# Patient Record
Sex: Female | Born: 1960 | Race: Black or African American | Hispanic: No | Smoking: Current every day smoker
Health system: Southern US, Community
[De-identification: ages and names within clinical notes are randomized; demographics above are authoritative.]

## PROBLEM LIST (undated history)

## (undated) DIAGNOSIS — K219 Gastro-esophageal reflux disease without esophagitis: Secondary | ICD-10-CM

## (undated) DIAGNOSIS — E213 Hyperparathyroidism, unspecified: Secondary | ICD-10-CM

## (undated) DIAGNOSIS — I10 Essential (primary) hypertension: Secondary | ICD-10-CM

## (undated) DIAGNOSIS — E785 Hyperlipidemia, unspecified: Secondary | ICD-10-CM

## (undated) DIAGNOSIS — D649 Anemia, unspecified: Secondary | ICD-10-CM

## (undated) DIAGNOSIS — N189 Chronic kidney disease, unspecified: Secondary | ICD-10-CM

## (undated) HISTORY — DX: Hyperlipidemia, unspecified: E78.5

## (undated) HISTORY — DX: Chronic kidney disease, unspecified: N18.9

## (undated) HISTORY — PX: CERVICAL DISCECTOMY: SHX98

## (undated) HISTORY — PX: WISDOM TOOTH EXTRACTION: SHX21

## (undated) HISTORY — DX: Gastro-esophageal reflux disease without esophagitis: K21.9

## (undated) HISTORY — DX: Essential (primary) hypertension: I10

---

## 1997-06-15 ENCOUNTER — Other Ambulatory Visit: Admission: RE | Admit: 1997-06-15 | Discharge: 1997-06-15 | Payer: Self-pay | Admitting: *Deleted

## 1998-06-11 ENCOUNTER — Other Ambulatory Visit: Admission: RE | Admit: 1998-06-11 | Discharge: 1998-06-11 | Payer: Self-pay | Admitting: *Deleted

## 1998-06-20 ENCOUNTER — Encounter: Payer: Self-pay | Admitting: Cardiovascular Disease

## 1998-06-20 ENCOUNTER — Inpatient Hospital Stay (HOSPITAL_COMMUNITY): Admission: AD | Admit: 1998-06-20 | Discharge: 1998-06-22 | Payer: Self-pay | Admitting: Cardiovascular Disease

## 1999-06-03 ENCOUNTER — Other Ambulatory Visit: Admission: RE | Admit: 1999-06-03 | Discharge: 1999-06-03 | Payer: Self-pay | Admitting: *Deleted

## 2000-06-04 ENCOUNTER — Other Ambulatory Visit: Admission: RE | Admit: 2000-06-04 | Discharge: 2000-06-04 | Payer: Self-pay | Admitting: *Deleted

## 2000-06-16 ENCOUNTER — Encounter: Payer: Self-pay | Admitting: Neurosurgery

## 2000-06-19 ENCOUNTER — Encounter: Payer: Self-pay | Admitting: Neurosurgery

## 2000-06-19 ENCOUNTER — Ambulatory Visit (HOSPITAL_COMMUNITY): Admission: RE | Admit: 2000-06-19 | Discharge: 2000-06-20 | Payer: Self-pay | Admitting: Neurosurgery

## 2000-07-23 ENCOUNTER — Encounter: Payer: Self-pay | Admitting: Neurosurgery

## 2000-07-23 ENCOUNTER — Ambulatory Visit (HOSPITAL_COMMUNITY): Admission: RE | Admit: 2000-07-23 | Discharge: 2000-07-23 | Payer: Self-pay | Admitting: Neurosurgery

## 2000-09-24 ENCOUNTER — Encounter: Payer: Self-pay | Admitting: Neurosurgery

## 2000-09-24 ENCOUNTER — Ambulatory Visit (HOSPITAL_COMMUNITY): Admission: RE | Admit: 2000-09-24 | Discharge: 2000-09-24 | Payer: Self-pay | Admitting: Neurosurgery

## 2001-07-27 ENCOUNTER — Ambulatory Visit (HOSPITAL_COMMUNITY): Admission: RE | Admit: 2001-07-27 | Discharge: 2001-07-27 | Payer: Self-pay | Admitting: Neurosurgery

## 2001-07-27 ENCOUNTER — Encounter: Payer: Self-pay | Admitting: Neurosurgery

## 2001-09-16 ENCOUNTER — Ambulatory Visit (HOSPITAL_COMMUNITY): Admission: RE | Admit: 2001-09-16 | Discharge: 2001-09-16 | Payer: Self-pay | Admitting: Otolaryngology

## 2001-09-16 ENCOUNTER — Encounter: Payer: Self-pay | Admitting: Otolaryngology

## 2001-09-23 ENCOUNTER — Other Ambulatory Visit: Admission: RE | Admit: 2001-09-23 | Discharge: 2001-09-23 | Payer: Self-pay | Admitting: *Deleted

## 2001-12-23 ENCOUNTER — Observation Stay (HOSPITAL_COMMUNITY): Admission: EM | Admit: 2001-12-23 | Discharge: 2001-12-24 | Payer: Self-pay | Admitting: Emergency Medicine

## 2001-12-23 ENCOUNTER — Encounter: Payer: Self-pay | Admitting: Emergency Medicine

## 2001-12-24 ENCOUNTER — Encounter: Payer: Self-pay | Admitting: Family Medicine

## 2002-01-20 ENCOUNTER — Ambulatory Visit (HOSPITAL_COMMUNITY): Admission: RE | Admit: 2002-01-20 | Discharge: 2002-01-20 | Payer: Self-pay | Admitting: Family Medicine

## 2002-04-12 ENCOUNTER — Emergency Department (HOSPITAL_COMMUNITY): Admission: EM | Admit: 2002-04-12 | Discharge: 2002-04-12 | Payer: Self-pay | Admitting: Emergency Medicine

## 2002-04-25 ENCOUNTER — Ambulatory Visit (HOSPITAL_COMMUNITY): Admission: RE | Admit: 2002-04-25 | Discharge: 2002-04-25 | Payer: Self-pay | Admitting: Family Medicine

## 2002-04-25 ENCOUNTER — Encounter: Payer: Self-pay | Admitting: Family Medicine

## 2002-05-26 ENCOUNTER — Encounter (HOSPITAL_COMMUNITY): Admission: RE | Admit: 2002-05-26 | Discharge: 2002-06-25 | Payer: Self-pay | Admitting: Neurosurgery

## 2002-10-10 ENCOUNTER — Other Ambulatory Visit: Admission: RE | Admit: 2002-10-10 | Discharge: 2002-10-10 | Payer: Self-pay | Admitting: *Deleted

## 2002-12-19 HISTORY — PX: COLONOSCOPY WITH ESOPHAGOGASTRODUODENOSCOPY (EGD): SHX5779

## 2003-01-05 ENCOUNTER — Ambulatory Visit (HOSPITAL_COMMUNITY): Admission: RE | Admit: 2003-01-05 | Discharge: 2003-01-05 | Payer: Self-pay | Admitting: Internal Medicine

## 2003-03-23 ENCOUNTER — Ambulatory Visit (HOSPITAL_COMMUNITY): Admission: RE | Admit: 2003-03-23 | Discharge: 2003-03-23 | Payer: Self-pay | Admitting: *Deleted

## 2004-04-02 ENCOUNTER — Ambulatory Visit (HOSPITAL_COMMUNITY): Admission: RE | Admit: 2004-04-02 | Discharge: 2004-04-02 | Payer: Self-pay | Admitting: *Deleted

## 2005-04-15 ENCOUNTER — Ambulatory Visit (HOSPITAL_COMMUNITY): Admission: RE | Admit: 2005-04-15 | Discharge: 2005-04-15 | Payer: Self-pay | Admitting: *Deleted

## 2006-06-12 ENCOUNTER — Ambulatory Visit (HOSPITAL_COMMUNITY): Admission: RE | Admit: 2006-06-12 | Discharge: 2006-06-12 | Payer: Self-pay | Admitting: *Deleted

## 2006-11-05 ENCOUNTER — Ambulatory Visit (HOSPITAL_COMMUNITY): Admission: RE | Admit: 2006-11-05 | Discharge: 2006-11-05 | Payer: Self-pay | Admitting: Family Medicine

## 2007-07-13 ENCOUNTER — Ambulatory Visit (HOSPITAL_COMMUNITY): Admission: RE | Admit: 2007-07-13 | Discharge: 2007-07-13 | Payer: Self-pay | Admitting: *Deleted

## 2007-07-15 ENCOUNTER — Encounter (HOSPITAL_COMMUNITY): Admission: RE | Admit: 2007-07-15 | Discharge: 2007-08-14 | Payer: Self-pay | Admitting: Family Medicine

## 2008-08-09 ENCOUNTER — Ambulatory Visit (HOSPITAL_COMMUNITY): Admission: RE | Admit: 2008-08-09 | Discharge: 2008-08-09 | Payer: Self-pay | Admitting: Obstetrics and Gynecology

## 2009-06-01 ENCOUNTER — Inpatient Hospital Stay (HOSPITAL_COMMUNITY): Admission: EM | Admit: 2009-06-01 | Discharge: 2009-06-02 | Payer: Self-pay | Admitting: Emergency Medicine

## 2010-01-21 ENCOUNTER — Ambulatory Visit (HOSPITAL_COMMUNITY)
Admission: RE | Admit: 2010-01-21 | Discharge: 2010-01-21 | Payer: Self-pay | Source: Home / Self Care | Admitting: Obstetrics and Gynecology

## 2010-03-10 ENCOUNTER — Encounter: Payer: Self-pay | Admitting: *Deleted

## 2010-05-07 LAB — COMPREHENSIVE METABOLIC PANEL
BUN: 15 mg/dL (ref 6–23)
Calcium: 9.8 mg/dL (ref 8.4–10.5)
GFR calc Af Amer: >60 mL/min (ref >60)
Glucose, Bld: 113 mg/dL — ABNORMAL HIGH (ref 70–99)
Potassium: 3.9 mEq/L (ref 3.5–5.1)

## 2010-05-07 LAB — LIPID PANEL
HDL: 59 mg/dL (ref >39)
LDL Cholesterol: 142 mg/dL — ABNORMAL HIGH (ref 0–99)
Triglycerides: 78 mg/dL (ref <150)

## 2010-05-07 LAB — DIFFERENTIAL
Basophils Absolute: 0.1 10*3/uL (ref 0.0–0.1)
Basophils Relative: 1 % (ref 0–1)
Eosinophils Relative: 2 % (ref 0–5)
Lymphs Abs: 3.6 10*3/uL (ref 0.7–4.0)
Monocytes Absolute: 0.5 10*3/uL (ref 0.1–1.0)
Monocytes Relative: 6 % (ref 3–12)
Neutro Abs: 4.8 10*3/uL (ref 1.7–7.7)
Neutrophils Relative %: 52 % (ref 43–77)

## 2010-05-07 LAB — CBC
HCT: 38.8 % (ref 36.0–46.0)
MCHC: 35.5 g/dL (ref 30.0–36.0)
MCV: 87.2 fL (ref 78.0–100.0)
Platelets: 276 10*3/uL (ref 150–400)
RBC: 4.46 MIL/uL (ref 3.87–5.11)
RDW: 12.6 % (ref 11.5–15.5)

## 2010-05-07 LAB — URINALYSIS, ROUTINE W REFLEX MICROSCOPIC
Glucose, UA: NEGATIVE mg/dL
Leukocytes, UA: NEGATIVE
Nitrite: NEGATIVE
Nitrite: NEGATIVE
Urobilinogen, UA: 0.2 mg/dL (ref 0.0–1.0)
pH: 5.5 (ref 5.0–8.0)

## 2010-05-07 LAB — PREGNANCY, URINE: Preg Test, Ur: NEGATIVE

## 2010-05-07 LAB — POCT CARDIAC MARKERS
CKMB, poc: <1 ng/mL — ABNORMAL LOW (ref 1.0–8.0)
Myoglobin, poc: 54.6 ng/mL (ref 12–200)
Troponin i, poc: <0.05 ng/mL (ref 0.00–0.09)

## 2010-05-07 LAB — URINE CULTURE: Culture: NO GROWTH

## 2010-05-07 LAB — URINE MICROSCOPIC-ADD ON

## 2010-05-07 LAB — BASIC METABOLIC PANEL
CO2: 23 mEq/L (ref 19–32)
Chloride: 106 mEq/L (ref 96–112)
GFR calc Af Amer: >60 mL/min (ref >60)
GFR calc non Af Amer: 53 mL/min — ABNORMAL LOW (ref >60)
Glucose, Bld: 91 mg/dL (ref 70–99)
Sodium: 136 mEq/L (ref 135–145)

## 2010-05-07 LAB — HEMOGLOBIN A1C: Hgb A1c MFr Bld: 5.9 % — ABNORMAL HIGH (ref <5.7)

## 2010-07-05 NOTE — Op Note (Signed)
NAME:  Sandra Trujillo, Sandra Trujillo                          ACCOUNT NO.:  [id number]   MEDICAL RECORD NO.:  [id number]                   PATIENT TYPE:  AMB   LOCATION:  DAY                                  FACILITY:  [facility]   PHYSICIAN:  Lionel December, M.D.                 DATE OF BIRTH:  December 12, 1960   DATE OF PROCEDURE:  01/05/2003  DATE OF DISCHARGE:                                 OPERATIVE REPORT   PROCEDURE:  Esophagogastroduodenoscopy followed by total colonoscopy.   INDICATIONS FOR PROCEDURE:  Sandra Trujillo is a 50 year old African American female  who was recently found to have microcytic anemia subsequently confirmed to  be due to iron deficiency.  Serum ferritin was 3.  She does have chronic  GERD, and her symptoms have not been well-controlled but she is doing better  since she was switched over to Nexium recently.  She has not had any melena  or rectal bleeding.  It is possible that her iron deficiency anemia is due  to uterine blood losses.  She is undergoing GI workup to make sure she does  not have any condition which could be causing her iron deficiency anemia.  The procedure risks were reviewed with the patient, and informed consent was  obtained.   PREOPERATIVE MEDICATIONS:  Cetacaine spray for pharyngeal topical  anesthesia, Demerol 50 mg IV, Versed 6 mg IV in divided doses.   FINDINGS:  The procedure was performed in the endoscopy suite.  The  patient's vital signs and O2 saturations were monitored during the procedure  and remained stable.   PROCEDURE #1, ESOPHAGOGASTRODUODENOSCOPY:  The patient was placed in the  left lateral recumbent position, and the Olympus videoscope was passed via  the oropharynx without any difficulty into the esophagus.   Esophagus:  The mucosa of the esophagus was normal.  The squamocolumnar  junction was wavy and had erythema on the gastric side.  There was a small  sliding hiatal hernia.   Stomach:  It was empty and distended very well with  insufflation.  The folds  of the proximal stomach were normal.  Examination of  the mucosa was normal,  except for prepyloric focal erythema.  There were no erosions or ulcers  noted.  The pyloric channel was patent.  The angularis, fundus, and cardia  were examined by retroflexing the scope and were normal.   Duodenum:  Examination of the bulb and postbulbar duodenum was normal.  The  endoscope was withdrawn and the patient prepared for procedure #2.   PROCEDURE #2, TOTAL COLONOSCOPY:  Rectal was examination performed.  No  abnormality noted on external or digital exam.  The Olympus videoscope was  placed into the rectum and advanced into the region of the sigmoid colon and  beyond.  The preparation was excellent.  The scope was passed to the cecum  which was identified by the appendiceal orifice and ileocecal valve.  Pictures  were taken for the record.  A short segment of TI was also examined  and was normal.  As the scope was withdrawn, the colonic mucosa was  carefully examined and was normal.  The rectal mucosa similarly was normal.  The scope was retroflexed to examine the anorectal junction which was  unremarkable.  The endoscope was straightened and withdrawn.  The patient  tolerated the procedure well.   FINAL DIAGNOSES:  1. Small sliding hiatal hernia and mild change of reflux esophagitis limited     to gastroesophageal junction.  Otherwise normal     esophagogastroduodenoscopy.  2. Normal colonoscopy and terminal ileoscopy.  3. I suspect her iron deficiency anemia is secondary to uterine blood     losses.   RECOMMENDATIONS:  1. She will continue antireflux measures and Nexium as before.  2. She will resume ferrous sulfate 325 mg p.o. Truj[place].i.d. or equivalent     preparation.  3. She will have CBC in one month from now.      ___________________________________________                                            Lionel December, M.D.   NR/MEDQ  D:  01/05/2003  T:   01/06/2003  Job:  [id number]   cc:   Caro Hight, N.P.   Scott A. Gerda Diss, M.D.  [address]  [place]  [place] [address]  Fax: [phone]

## 2010-07-05 NOTE — Discharge Summary (Signed)
   NAMEDESTENEE, GUERRY NO.:  [id number]   MEDICAL RECORD NO.:  [id number]                  PATIENT TYPE:   LOCATION:                                       FACILITY:   PHYSICIAN:  Donna Bernard, M.D.             DATE OF BIRTH:   DATE OF ADMISSION:  DATE OF DISCHARGE:  12/24/2001                                 DISCHARGE SUMMARY   DIAGNOSES:  1. Chest pain, myocardial infarction ruled out.  2. Hypertension.   DISPOSITION:  1. The patient discharged home.  2. The patient to maintain usual medications.  3. The patient to follow up with Dr. Lilyan Punt next week.   INITIAL HISTORY AND PHYSICAL:  Please see H&P as dictated per Dr. Hoover Brunette.   HOSPITAL COURSE:  This patient is a 50 year old female with a history of  hypertension who presented to the hospital the day of admission with a  complaint of chest discomfort.  The patient described the chest pain as  sharp at times and retro-sternal.  The pain at times was quite discomforting  and intermittent.  She was seen in the emergency room and given  nitroglycerine GI cocktail which seemed to improve her symptoms.  The  patient was admitted to the hospital and she had serial cardiac enzymes  performed which were negative.  Telemetry revealed no evidence of any type  of abnormalities.  HOTs were stable.  Gallbladder ultrasound was performed  which was unremarkable.  Due to the potential gastritis component, the  patient was given Prilosec 20 mg daily.  The day after admission, the  patient was doing fine.  She had no pain.  With her history of a negative  cardiac workup 2 years before, the patient felt that she could safely head  home and continue her workup through her primary care physician.  The  patient was discharged home.  Diagnoses and disposition as noted above.                                               Donna Bernard, M.D.    WSL/MEDQ  D:  01/29/2002  T:  01/30/2002  Job:   [id number]

## 2010-07-05 NOTE — H&P (Signed)
Sandra Trujillo, Sandra Trujillo                            ACCOUNT NO.:  [id number]   MEDICAL RECORD NO.:  [id number]                  PATIENT TYPE:   LOCATION:                                       FACILITY:   PHYSICIAN:  Lionel December, M.D.                 DATE OF BIRTH:  02/14/1961   DATE OF ADMISSION:  DATE OF DISCHARGE:                                HISTORY & PHYSICAL   REQUESTING PHYSICIAN:  Scott A. Gerda Diss, M.D.   CHIEF COMPLAINT:  Intractable GERD and normocytic anemia.   HISTORY OF PRESENT ILLNESS:  This patient is a 50 year old African-American  female who presents to our office for evaluation of persistent dyspepsia and  reflux which she just describes as a persistent burning pain that radiates  through to her back.  She states that she has had this off and on for 5  years now. old African-American  female who presents to our office for evaluation of persistent dyspepsia and  reflux which she just describes as a persistent burning pain that radiates  through to her back.  She states that she has had this off and on for 5  years now. She has been on Protonix for approximately 11 months with good  relief of her symptoms; however, they have exacerbated recently.  She also  is complaining of some intermittent dysphagia as well as odynophagia. She  reports occasional nausea without any emesis.  She was evaluated by Caro Hight, NP and found to have normocytic iron-deficiency anemia with a  hemoglobin of 9.3, hematocrit of 29.6, MCV of 81.1 and a ferritin of 3 and  vitamin B12 of 343.  She denies any known history of anemia or peptic ulcer  disease.  She denies any melena or rectal bleeding.  She reports that her  bowel movements are soft, formed, and brown on a daily basis.  She was  recently started on iron approximately 3 weeks ago.  She denies any NSAID or  aspirin use. She denies any known history of colorectal carcinoma.   PAST MEDICAL HISTORY:  1. Hypertension.  2. Seasonal allergic rhinitis.   PAST SURGICAL HISTORY:  Cervical discectomy and normal cardiac  catheterization in 2000.   CURRENT MEDICATIONS:  1. Protonix 40 mg daily.  2. Neurontin unknown dose daily.  3. Ziac unknown dose daily.  4. Micronor daily.  5. Allegra  180 mg daily.  6. Nasonex daily.  7. Chromagen Forte 1 tablet daily.   ALLERGIES:  SULFA.   FAMILY HISTORY:  No known family history of colorectal carcinoma, liver, or  chronic GI problems.  Mother is age 55 with hypertension.  Father is age 41  with hypertension as well as asthma.  She has 2 sisters and 2 brothers all  of whom are in good health except for a family history, as mentioned above.   SOCIAL HISTORY:  This patient has been married for 15 years.  She has one 51-  year-old daughter in good health.  She is currently employed, full time,  with [employer].  She currently is not a smoker.  How she  reports a 15+ year history of smoking approximately 1 pack per day  quitting  in December 2003.  She denies alcohol or drug use.   REVIEW OF SYSTEMS:  CONSTITUTIONAL:  Weight is stable or slightly increased  to ___________ uptake.  She does reports some fatigue.  She denies any fever  or chills.  CARDIOVASCULAR:  She denies any palpitations.  PULMONARY:  She  denies any shortness of breath, dyspnea or hemoptysis.  SKIN:  She denies  any rash or jaundice except for recent tinea pedis.   PHYSICAL EXAMINATION:  VITAL SIGNS:  Weight 172 pounds.  Height 54 inches.  Temperature 97.7, blood pressure 110/7r, pulse 68.  GENERAL:  This patient is a 50 year old African-American female who is well  developed well nourished in no acute distress.  She is alert, oriented,  pleasant and cooperative. old African-American female who is well  developed well nourished in no acute distress.  She is alert, oriented,  pleasant and cooperative.  HEENT:  Sclerae are clear.  Nonicteric.  Conjunctivae pink. Oropharynx pink  and moist without any lesions.  NECK:  Supple without any masses or thyromegaly.  There is visible scar from  prior cervical surgery which is well healed.  HEART:  Regular rate and rhythm with any murmurs, clicks, rubs or gallops.  LUNGS:  Clear to auscultation bilaterally.  ABDOMEN:  Positive bowel sounds all 4 quadrants.  Soft, nontender,  nondistended.  No palpable organomegaly.  EXTREMITIES:  No edema and  good color.  SKIN:  Brown, warm and dry, without any rash or jaundice.  RECTAL:  There are no visible external hemorrhoids and no internal  hemorrhoids palpated.  There is a small amount of heme positive dark stool  obtained.  Good sphincter tone. No palpable masses.   LABORATORY STUDIES:  See HPI.  H. pylori was negative, 0.38.   ASSESSMENT:  This patient is a 50 year old African-American female with  persistent intractable epigastric pain and gastroesophageal reflux disease,  normocytic anemia, as well as heme-positive stool. old African-American female with  persistent intractable epigastric pain and gastroesophageal reflux disease,  normocytic anemia, as well as heme-positive stool.  This is very suspicious  for non-Helicobacter pylori peptic ulcer disease or reflux esophagitis.   I have recommended esophagogastroduodenoscopy by Dr. Karilyn Cota to be performed  at [facility]  I have discussed this procedure with the patient as  well as risks and benefits which include, bleeding, perforation, and  infection.  Consent will be obtained. Also due to her heme-positive stools  the patient would like to proceed with colonoscopy as well and, I feel, that  this is reasonable. Also, we will recheck H&H today.  She is to continue on  PPI, however, she says that she has little relief with her Protonix,  therefore, we will try Nexium today.   RECOMMENDATIONS:  1. Colonoscopy and EGD with Dr. Karilyn Cota at [facility].  2. I have given her Nexium samples also.  I have given her a prescription     for Nexium 40 mg to take 30 minutes before breakfast, #30 with 3 refills.  3. Check H&H today.  4. We will follow up pending procedure results per Dr. Patty Sermons     recommendations.   We would like to thank Dr. Lilyan Punt as well as Sherie Don, NP for  allowing [place] to participate in this patient's care.     _____________________________________  ___________________________________________  Nicholas Lose, N.P.               Lionel December, M.D.   KC/MEDQ  D:  12/22/2002  T:  12/22/2002  Job:  [id number]   cc:   Lorin Picket A. Gerda Diss, M.D. [address]  [place]  [place] [address]  Fax: [phone]

## 2010-07-05 NOTE — Procedures (Signed)
   NAME:  Sandra Trujillo, Sandra Trujillo                          ACCOUNT NO.:  [id number]   MEDICAL RECORD NO.:  [id number]                   PATIENT TYPE:  OUT   LOCATION:  DFTL                                 FACILITY:  [facility]   PHYSICIAN:  Scott A. Gerda Diss, M.D.               DATE OF BIRTH:  Mar 02, 1960   DATE OF PROCEDURE:  01/20/2002  DATE OF DISCHARGE:                                    STRESS TEST   INDICATIONS:  Chest pains, hypertension.   RESTING EKG:  There are no acute ST segment changes, slight voltage is noted  in lead II.   EXERCISE PHASE:  The patient exercised all the way to 9 minutes and 37  seconds.  Achieved a peak heart rate of 149.  She did not have any ST  segment changes indicative of coronary artery disease during exercise phase.   SYMPTOMATOLOGY:  Legs burning, a little bit of increased breathing.  No  chest pain.   RECOVERY PHASE:  The patient had an uneventful recovery phase with  occasional PAC's and one PVC.  None of these were considered problematic or  significant for other problems.   BLOOD PRESSURE RESPONSE TO EXERCISE:  The patient had a moderate blood  pressure response to exercise but not considered abnormal.   INTERPRETATION:  Normal stress test.                                               Scott A. Gerda Diss, M.D.    SAL/MEDQ  D:  01/20/2002  T:  01/21/2002  Job:  [id number]

## 2010-07-05 NOTE — H&P (Signed)
NAME:  Sandra Trujillo, Sandra Trujillo                          ACCOUNT NO.:  [id number]   MEDICAL RECORD NO.:  [id number]                   PATIENT TYPE:  INP   LOCATION:  A228                                 FACILITY:  [facility]   PHYSICIAN:  Sarita Bottom, M.D.                  DATE OF BIRTH:  [birth year]   DATE OF ADMISSION:  12/23/2001  DATE OF DISCHARGE:                                HISTORY & PHYSICAL   REASON FOR ADMISSION:  Chest pain, rule out myocardial infarction.   CHIEF COMPLAINT:  I have chest pain.   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  She described the chest  pain as sharp.  It was retrosternal in location, 10/10.  By the time the  patient was being seen, it kept going down to 3/10.  The pain was  intermittent and it had been going on since 9 a.m. on the day of admission.  The pain she said radiated to her back.  It was not associated with any  nausea or shortness of breath.  There were no palpitations.  She denies any  dizziness.  She came to the emergency room on account of her chest pain and  she said the pain was slowly relieved after she was given nitroglycerin and  a GI cocktail.   REVIEW OF SYSTEMS:  As per HPI.  All other systems reviewed and were  negative.   PAST MEDICAL HISTORY:  Significant for hypertension.  She had a similar  chest pain in 2000 for which she had a stress test and a cardiac  catheterization, both of which were negative.   MEDICATIONS:  Ziac 10 mg q.d.   ALLERGIES:  She has no known drug allergies.   FAMILY HISTORY:  Her mother has hypertension, father has hypertension.   SOCIAL HISTORY:  She is married with one child.  She smokes about one pack  per day for 15 years.  She does not drink alcohol and does not use any  drugs.  She works at [employer].   PHYSICAL EXAMINATION:  VITAL  SIGNS:  Blood pressure 143/80, heart rate 80.  GENERAL:  She was a young lady lying on a stretcher.  She was not in any  apparent distress.  HEENT:  The patient was not pale.  She was anicteric.  Pupils were equal and  reactive to light and accommodation.  NECK:  Supple.  There was no carotid bruit.  There was no jugular venous  distention.  CHEST:  Air entry was adequate bilaterally.  Breath sounds were vesicular.  No wheeze or crepitations were heard.  CARDIOVASCULAR:  Heart sounds 1 and 2 were heard of regular rhythm and rate.  No murmurs were appreciated.  ABDOMEN:  Soft.  She had slight epigastric pain.  No masses or organomegaly  were felt.  CENTRAL NERVOUS SYSTEM:  She was alert and oriented x3.  No gross or focal  neurological deficits were seen.  EXTREMITIES:  She did not have any pedal edema.  Pedal pulses were 2+  bilaterally.   LABORATORY DATA:  Sodium 136, potassium 4.0, chloride 102, CO2 28, BUN 12,  creatinine 1.3, glucose 79, calcium 10.2.  WBC 10.3, normal differential,  hemoglobin 13, hematocrit 38.3.  Cardiac enzymes showed a CT of 149, MB  fraction 1.0, troponin-I 0.1.  Coagulation profile:  PTT of 24, PT of 12.3,  INR 1.0.   EKG was significant for normal sinus rhythm, normal interval, normal  ventricle axis.  There were no acute ST or T-wave changes.  Her chest x-ray  was normal.  No cardiomegaly, no acute infiltrates.   ASSESSMENT:  Atypical chest pain.  Based on the patient's history of  hypertension and smoking history, she will be admitted for myocardial  infarction to be ruled out with serial cardiac enzymes and EKG.  She will be  given aspirin 325 mg q.d.  She will be put on metoprolol 20 mg b.i.d.  She  will be given nitroglycerin p.r.n.  She will be put on telemetry monitoring.  For the patient's hypertension, she will be continued on the Ziac at 10 mg  p.o. q.d.  She will be admitted for Dr. Lilyan Punt.  Further workup and  management will depend on  the patient's clinical course.                                                 Sarita Bottom, M.D.    DW/MEDQ  D:  12/23/2001  T:  12/24/2001  Job:  [id number]

## 2010-07-05 NOTE — Op Note (Signed)
[place]  Patient:    Sandra Trujillo, Sandra Trujillo                         MRN: [id number] Proc. Date: 06/19/00 Adm. Date:  [id number] Disc. 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  PROCEDURE:  Anterior cervical diskectomy and fusion with fusion at C4-5 and C5-6 using 6 mm fibular allografts and a 40 mm Atlantis plate with two 13 mm fixed screws and four 13 mm variable-angled screws.  ANESTHESIA:  General endotracheal.  CLINICAL NOTE:  The patient is a very pleasant 50 year old female who has had progressive worsening neck and right arm pain that radiated down to her shoulder and the first and second finger.  It has gotten progressively worse over the last several months.  She failed conservative treatment with anti-inflammmatories and physical therapy.  Preoperative imaging showed reversal of the normal cervical lordosis and a calcified deformity centering degeneration of the disk at C4-5 and C5-6.  I extensively discussed the risks and benefits of surgery with the patient.  She understands and agreed to proceed forward.  DESCRIPTION OF PROCEDURE:  The patient was brought into the OR, was induced under general anesthesia, positioned supine, and the head in slight extension and five pounds of traction, shoulder roll.  The right side of her neck was prepped and draped in the usual sterile fashion.  Preoperative imaging showed localization of a probe at the 3-4 interspace, and incision was centered just inferior to this in a curvilinear fashion just off the midline to the border of the sternocleidomastoid with a 20 blade scalpel.  Superficially the platysma was dissected out and divided longitudinally.  The avascular plane between the sternocleidomastoid and the omohyoid was  developed down to the prevertebral fascia.  The prevertebral fascia was dissected free with Kitners. The longus colli was reflected laterally.  A self-retaining retractor was placed.  Then the intraoperative x-ray confirmed localization of a probe in the C5-6 interspace.  Both annulotomies were made with an 11 blade scalpel, and pituitary rongeurs were used to remove the anterior margin of the annulus, and a high-speed drill was used to drill down the posterior aspects at both levels.  The operating microscope was then draped and brought into the field and under microscopic illumination, the C4-5 interspace was cleaned out. There was a large osteophytic spur noted coming off C4-5.  This was removed in piecemeal fashion as well as C5 body.  Ligamentum flavum was removed in piecemeal fashion.  Both C5 nerve roots were decompressed throughout their proximal extent.  Then after the thecal sac was noted to be completely decompressed, Gelfoam was overlaid, and attention was taken to the C5-6 interspace.  Again the remainder of the posterior osteophytes were removed, visualized just coming off the posterior aspect of the C5 vertebral body, compressing the spinal cord.  This was removed in piecemeal fashion using a 1 and 2 mm Kerrison punch.  The posterior longitudinal ligament was identified, removed in piecemeal fashion.  Thecal sac was visualized.  Then both C6 nerve roots were radically decompressed out their foramen and explored with angled nerve hook and noted to be completely decompressed.  Both end plates were prepared for arthrodesis and scraped off.  Two 6 mm allografts were selected and inserted after the interbody spreader had been placed.  They were noted to be both in excellent position under compression, and a 40 mm Atlantis plate was selected and 13 mm screws were inserted.  Two fixed-angle screws in the fixed guide became unusable, and we switched to the variable guide with the  variable-angled screws, and four variable-angled screws were inserted, two screws in C4, 5, and 6, respectively.  The postoperative x-ray showed good localization of the bone grafts and the anterior cervical plate. The wound was copiously irrigated, and meticulous hemostasis was maintained. The longus colli was coagulated, then the platysma was closed with 3-0 interrupted Vicryls, the skin was closed with running 4-0 subcuticular, benzoin and Steri-Strips were applied.  The patient went to the recovery room in stable condition.  At the end of the case, all needle counts, sponge counts were correct. DD:  06/19/00 TD:  06/22/00 Job: [id number]

## 2012-04-16 ENCOUNTER — Other Ambulatory Visit (HOSPITAL_COMMUNITY): Payer: Self-pay | Admitting: Obstetrics and Gynecology

## 2012-04-16 DIAGNOSIS — Z139 Encounter for screening, unspecified: Secondary | ICD-10-CM

## 2012-04-22 ENCOUNTER — Ambulatory Visit (HOSPITAL_COMMUNITY)
Admission: RE | Admit: 2012-04-22 | Discharge: 2012-04-22 | Disposition: A | Discharge status: Home / Self Care | Payer: BC Managed Care – PPO | Source: Ambulatory Visit | Attending: Obstetrics and Gynecology | Admitting: Obstetrics and Gynecology

## 2012-04-22 DIAGNOSIS — Z139 Encounter for screening, unspecified: Secondary | ICD-10-CM

## 2012-04-22 DIAGNOSIS — Z1231 Encounter for screening mammogram for malignant neoplasm of breast: Secondary | ICD-10-CM | POA: Insufficient documentation

## 2012-12-11 ENCOUNTER — Encounter: Payer: Self-pay | Admitting: *Deleted

## 2012-12-16 ENCOUNTER — Ambulatory Visit (INDEPENDENT_AMBULATORY_CARE_PROVIDER_SITE_OTHER): Payer: BC Managed Care – PPO | Admitting: Family Medicine

## 2012-12-16 ENCOUNTER — Encounter: Payer: Self-pay | Admitting: Family Medicine

## 2012-12-16 VITALS — BP 142/74 | Ht 64.0 in | Wt 161.4 lb

## 2012-12-16 DIAGNOSIS — I10 Essential (primary) hypertension: Secondary | ICD-10-CM

## 2012-12-16 DIAGNOSIS — Z79899 Other long term (current) drug therapy: Secondary | ICD-10-CM

## 2012-12-16 DIAGNOSIS — Z87891 Personal history of nicotine dependence: Secondary | ICD-10-CM | POA: Insufficient documentation

## 2012-12-16 DIAGNOSIS — E785 Hyperlipidemia, unspecified: Secondary | ICD-10-CM

## 2012-12-16 DIAGNOSIS — R350 Frequency of micturition: Secondary | ICD-10-CM

## 2012-12-16 LAB — POCT URINALYSIS DIPSTICK: Spec Grav, UA: 1.02

## 2012-12-16 MED ORDER — PRAVASTATIN SODIUM 40 MG PO TABS: 40.0000 mg | ORAL_TABLET | Freq: Every day | ORAL | Status: DC

## 2012-12-16 MED ORDER — LISINOPRIL 5 MG PO TABS: 5.0000 mg | ORAL_TABLET | Freq: Every day | ORAL | Status: DC

## 2012-12-16 NOTE — Progress Notes (Signed)
  Subjective:    Patient ID: Sandra Trujillo, female    DOB: Aug 11, 1960, 52 y.o.   MRN: [id number]  Hypertension This is a chronic problem. The current episode started more than 1 year ago. The problem has been gradually improving since onset. The problem is controlled. There are no associated agents to hypertension. There are no known risk factors for coronary artery disease. Treatments tried: lisinopril. The current treatment provides significant improvement. There are no compliance problems.     Patient states that she has been having tingling and numbness in the left hand and arm. This has been present for a couple of months now.   Patient also complains of urgency/frequency with urination and lower abdominal pain. It has been present for about 1 month now.   Patient states she has been having trouble with getting "strangled" very easily. It has been present for over a year now.     Review of Systems Denies any chest tightness pressure pain shortness breath nausea vomiting diarrhea.    Objective:   Physical Exam Lungs are clear hearts regular pulse normal abdomen soft extremities no edema blood pressure recheck is good      Assessment & Plan:  Patient needs to go ahead and get some lab work done and spent some period of time since his been done we will do this and then followup according to how it shows. She is to followup in 6 months.

## 2012-12-30 LAB — BASIC METABOLIC PANEL
BUN: 17 mg/dL (ref 6–23)
CO2: 28 mEq/L (ref 19–32)
Calcium: 10.3 mg/dL (ref 8.4–10.5)
Potassium: 4.5 mEq/L (ref 3.5–5.3)
Sodium: 137 mEq/L (ref 135–145)

## 2012-12-30 LAB — LIPID PANEL
Cholesterol: 198 mg/dL (ref 0–200)
LDL Cholesterol: 128 mg/dL — ABNORMAL HIGH (ref 0–99)
Total CHOL/HDL Ratio: 3.7 Ratio
Triglycerides: 83 mg/dL (ref <150)
VLDL: 17 mg/dL (ref 0–40)

## 2012-12-30 LAB — HEPATIC FUNCTION PANEL
ALT: 19 U/L (ref 0–35)
AST: 30 U/L (ref 0–37)
Total Protein: 6.7 g/dL (ref 6.0–8.3)

## 2013-01-05 ENCOUNTER — Other Ambulatory Visit: Payer: Self-pay

## 2013-01-05 MED ORDER — PRAVASTATIN SODIUM 80 MG PO TABS: 80.0000 mg | ORAL_TABLET | Freq: Every evening | ORAL | Status: DC

## 2013-01-10 ENCOUNTER — Emergency Department: Payer: Self-pay | Admitting: Emergency Medicine

## 2013-01-10 LAB — TROPONIN I: Troponin-I: <0.02 ng/mL

## 2013-01-10 LAB — BASIC METABOLIC PANEL
Anion Gap: 6 — ABNORMAL LOW (ref 7–16)
Calcium, Total: 9 mg/dL (ref 8.5–10.1)
Chloride: 109 mmol/L — ABNORMAL HIGH (ref 98–107)
Creatinine: 1.07 mg/dL (ref 0.60–1.30)
Glucose: 89 mg/dL (ref 65–99)
Osmolality: 278 (ref 275–301)
Potassium: 4.2 mmol/L (ref 3.5–5.1)

## 2013-01-10 LAB — CBC
MCH: 30.5 pg (ref 26.0–34.0)
Platelet: 207 10*3/uL (ref 150–440)

## 2013-06-24 ENCOUNTER — Other Ambulatory Visit (HOSPITAL_COMMUNITY): Payer: Self-pay | Admitting: Obstetrics and Gynecology

## 2013-06-24 DIAGNOSIS — Z139 Encounter for screening, unspecified: Secondary | ICD-10-CM

## 2013-07-01 ENCOUNTER — Ambulatory Visit (HOSPITAL_COMMUNITY)
Admission: RE | Admit: 2013-07-01 | Discharge: 2013-07-01 | Disposition: A | Discharge status: Home / Self Care | Payer: BC Managed Care – PPO | Source: Ambulatory Visit | Attending: Obstetrics and Gynecology | Admitting: Obstetrics and Gynecology

## 2013-07-01 DIAGNOSIS — R928 Other abnormal and inconclusive findings on diagnostic imaging of breast: Secondary | ICD-10-CM | POA: Insufficient documentation

## 2013-07-01 DIAGNOSIS — Z139 Encounter for screening, unspecified: Secondary | ICD-10-CM

## 2013-07-01 DIAGNOSIS — Z1231 Encounter for screening mammogram for malignant neoplasm of breast: Secondary | ICD-10-CM | POA: Insufficient documentation

## 2013-07-06 ENCOUNTER — Other Ambulatory Visit: Payer: Self-pay | Admitting: Obstetrics and Gynecology

## 2013-07-06 DIAGNOSIS — R928 Other abnormal and inconclusive findings on diagnostic imaging of breast: Secondary | ICD-10-CM

## 2013-07-21 ENCOUNTER — Ambulatory Visit
Admission: RE | Admit: 2013-07-21 | Discharge: 2013-07-21 | Disposition: A | Discharge status: Home / Self Care | Payer: BC Managed Care – PPO | Source: Ambulatory Visit | Attending: Obstetrics and Gynecology | Admitting: Obstetrics and Gynecology

## 2013-07-21 DIAGNOSIS — R928 Other abnormal and inconclusive findings on diagnostic imaging of breast: Secondary | ICD-10-CM

## 2014-01-18 ENCOUNTER — Other Ambulatory Visit: Payer: Self-pay | Admitting: Family Medicine

## 2014-01-24 ENCOUNTER — Other Ambulatory Visit: Payer: Self-pay | Admitting: Family Medicine

## 2014-05-12 ENCOUNTER — Other Ambulatory Visit: Payer: Self-pay | Admitting: Family Medicine

## 2014-05-12 MED ORDER — LISINOPRIL 5 MG PO TABS: 5.0000 mg | ORAL_TABLET | Freq: Every day | ORAL | Status: DC

## 2014-05-12 NOTE — Telephone Encounter (Signed)
Patient is completely out of blood pressure medication has appointment on 4/1 for med check can you send enough to cover until seen. Call into [facility].

## 2014-05-12 NOTE — Addendum Note (Signed)
Addended byCharolotte Capuchin D on: 05/12/2014 10:57 AM   Modules accepted: Orders

## 2014-05-12 NOTE — Telephone Encounter (Signed)
Patient notified. Med refilled.

## 2014-05-19 ENCOUNTER — Ambulatory Visit (INDEPENDENT_AMBULATORY_CARE_PROVIDER_SITE_OTHER): Payer: 59 | Admitting: Nurse Practitioner

## 2014-05-19 ENCOUNTER — Encounter: Payer: Self-pay | Admitting: Nurse Practitioner

## 2014-05-19 VITALS — BP 148/100 | Ht 64.0 in | Wt 170.0 lb

## 2014-05-19 DIAGNOSIS — E785 Hyperlipidemia, unspecified: Secondary | ICD-10-CM | POA: Diagnosis not present

## 2014-05-19 DIAGNOSIS — E042 Nontoxic multinodular goiter: Secondary | ICD-10-CM

## 2014-05-19 DIAGNOSIS — R1310 Dysphagia, unspecified: Secondary | ICD-10-CM

## 2014-05-19 DIAGNOSIS — J3089 Other allergic rhinitis: Secondary | ICD-10-CM | POA: Diagnosis not present

## 2014-05-19 DIAGNOSIS — R5383 Other fatigue: Secondary | ICD-10-CM | POA: Diagnosis not present

## 2014-05-19 DIAGNOSIS — K2970 Gastritis, unspecified, without bleeding: Secondary | ICD-10-CM

## 2014-05-19 DIAGNOSIS — I10 Essential (primary) hypertension: Secondary | ICD-10-CM

## 2014-05-19 MED ORDER — LOSARTAN POTASSIUM 50 MG PO TABS: 50.0000 mg | ORAL_TABLET | Freq: Every day | ORAL | Status: DC

## 2014-05-19 MED ORDER — FLUTICASONE PROPIONATE 50 MCG/ACT NA SUSP: 2.0000 | Freq: Every day | NASAL | Status: DC

## 2014-05-19 MED ORDER — LANSOPRAZOLE 30 MG PO CPDR: 30.0000 mg | DELAYED_RELEASE_CAPSULE | Freq: Two times a day (BID) | ORAL | Status: DC

## 2014-05-22 ENCOUNTER — Encounter: Payer: Self-pay | Admitting: Nurse Practitioner

## 2014-05-22 DIAGNOSIS — E042 Nontoxic multinodular goiter: Secondary | ICD-10-CM | POA: Insufficient documentation

## 2014-05-22 DIAGNOSIS — K2970 Gastritis, unspecified, without bleeding: Secondary | ICD-10-CM | POA: Insufficient documentation

## 2014-05-22 DIAGNOSIS — R1310 Dysphagia, unspecified: Secondary | ICD-10-CM | POA: Insufficient documentation

## 2014-05-22 NOTE — Progress Notes (Signed)
Subjective:  Presents for BP check. Out of BP meds x one week. Had a cough with Lisinopril x 6 months. Cough resolved after stopping. "Strangling easily on the right side. Feels like something stuck on the right side at times. Smokes one ppd. Has gained about 5 lbs over the past year. Drinks 2 cups of coffee and a 32 oz soda per day with caffeine. No alcohol use. Limited NSAID use. Frequent reflux. BMs normal; no change in color or blood in stool. Has not had colonoscopy. Itchy water eyes and sneezing for several days. No fever. Minimal cough. Clear drainage. Occasional sinus pressure.  Objective:   BP 148/100 mmHg  Ht 5\' 4"  (1.626 m)  Wt 170 lb (77.111 kg)  BMI 29.17 kg/m2 NAD. Alert, oriented. Conjunctivae mildly injected. TMs clear effusion, more on the right. Pharynx injected with clear PND noted. Neck supple with mild anterior adenopathy. Lungs clear. Heart RRR. Abdomen soft, non distended with distinct moderate epigastric area tenderness. Thyroid nontender to palpation with 2 small nodules on the right side. Lower extremities no edema.   Assessment: Essential hypertension, benign - Plan: Basic metabolic panel Non compliance  Hyperlipidemia - Plan: Lipid panel  Multiple thyroid nodules - Plan: US Soft Tissue Head/Neck, TSH  Dysphagia  Other allergic rhinitis  Gastritis  Other fatigue - Plan: Hepatic function panel, Basic metabolic panel, TSH, Vit D  25 hydroxy (rtn osteoporosis monitoring)  Plan:  Meds ordered this encounter  Medications  . losartan (COZAAR) 50 MG tablet    Sig: Take 1 tablet (50 mg total) by mouth daily.    Dispense:  90 tablet    Refill:  1    Order Specific Question:  Supervising Provider    Answer:  Mikey Kirschner [[id number]]  . fluticasone (FLONASE) 50 MCG/ACT nasal spray    Sig: Place 2 sprays into both nostrils daily.    Dispense:  48 g    Refill:  1    Order Specific Question:  Supervising Provider    Answer:  Mikey Kirschner [[id number]]  . lansoprazole  (PREVACID) 30 MG capsule    Sig: Take 1 capsule (30 mg total) by mouth 2 (two) times daily before a meal.    Dispense:  180 capsule    Refill:  1    Order Specific Question:  Supervising Provider    Answer:  Mikey Kirschner [[id number]]   Switch to Losartan. Monitor BP outside of office. Call back if still over 140/90.  Take Prevacid BID. Call back in 2 weeks if abdominal pain or reflux persists. Discussed importance of smoking cessation, decrease caffeine and weight loss. Strongly recommend patient get colonoscopy with possible EGD. Patient plans to make her own appointment. OTC meds as directed for allergies.  Explained that symptoms on right side of throat can be from drainage, reflux and/or thyroid. Thyroid US pending.  Return in about 6 months (around 11/18/2014) for recheck.

## 2014-05-24 ENCOUNTER — Ambulatory Visit (HOSPITAL_COMMUNITY)
Admission: RE | Admit: 2014-05-24 | Discharge: 2014-05-24 | Disposition: A | Discharge status: Home / Self Care | Payer: 59 | Source: Ambulatory Visit | Attending: Nurse Practitioner | Admitting: Nurse Practitioner

## 2014-05-24 DIAGNOSIS — E042 Nontoxic multinodular goiter: Secondary | ICD-10-CM | POA: Insufficient documentation

## 2014-05-24 DIAGNOSIS — E041 Nontoxic single thyroid nodule: Secondary | ICD-10-CM | POA: Diagnosis present

## 2014-05-26 LAB — HEPATIC FUNCTION PANEL
ALK PHOS: 83 IU/L (ref 39–117)
ALT: 14 IU/L (ref 0–32)
AST: 16 IU/L (ref 0–40)
Albumin: 4.4 g/dL (ref 3.5–5.5)
Bilirubin Total: 0.7 mg/dL (ref 0.0–1.2)
Bilirubin, Direct: 0.17 mg/dL (ref 0.00–0.40)
TOTAL PROTEIN: 6.8 g/dL (ref 6.0–8.5)

## 2014-05-26 LAB — BASIC METABOLIC PANEL
BUN / CREAT RATIO: 14 (ref 9–23)
BUN: 15 mg/dL (ref 6–24)
CALCIUM: 10 mg/dL (ref 8.7–10.2)
CHLORIDE: 103 mmol/L (ref 97–108)
CO2: 22 mmol/L (ref 18–29)
Creatinine, Ser: 1.07 mg/dL — ABNORMAL HIGH (ref 0.57–1.00)
GFR calc Af Amer: 69 mL/min/{1.73_m2} (ref >59)
GFR calc non Af Amer: 60 mL/min/{1.73_m2} (ref >59)
Glucose: 89 mg/dL (ref 65–99)
Potassium: 4.5 mmol/L (ref 3.5–5.2)
SODIUM: 141 mmol/L (ref 134–144)

## 2014-05-26 LAB — LIPID PANEL
CHOLESTEROL TOTAL: 153 mg/dL (ref 100–199)
Chol/HDL Ratio: 3 ratio units (ref 0.0–4.4)
HDL: 51 mg/dL (ref >39)
LDL Calculated: 84 mg/dL (ref 0–99)
Triglycerides: 90 mg/dL (ref 0–149)
VLDL CHOLESTEROL CAL: 18 mg/dL (ref 5–40)

## 2014-05-26 LAB — TSH: TSH: 0.934 u[IU]/mL (ref 0.450–4.500)

## 2014-05-26 LAB — VITAMIN D 25 HYDROXY (VIT D DEFICIENCY, FRACTURES): Vit D, 25-Hydroxy: 10.2 ng/mL — ABNORMAL LOW (ref 30.0–100.0)

## 2014-05-26 NOTE — Progress Notes (Signed)
LMRC

## 2014-05-31 ENCOUNTER — Other Ambulatory Visit: Payer: Self-pay | Admitting: Nurse Practitioner

## 2014-05-31 DIAGNOSIS — E559 Vitamin D deficiency, unspecified: Secondary | ICD-10-CM | POA: Insufficient documentation

## 2014-05-31 MED ORDER — VITAMIN D (ERGOCALCIFEROL) 1.25 MG (50000 UNIT) PO CAPS: 50000.0000 [IU] | ORAL_CAPSULE | ORAL | Status: DC

## 2014-05-31 NOTE — Progress Notes (Signed)
Take Rx vitamin D once a week for 8 weeks then 2000 units OTC daily

## 2014-05-31 NOTE — Progress Notes (Signed)
Patient notified and verbalized understanding of the test results. No further questions. Would like the rx of the vitamin D. Needs to know the info on the OTC. Will call to have TSH repeated in 6 months.

## 2014-06-15 ENCOUNTER — Other Ambulatory Visit: Payer: Self-pay | Admitting: Nurse Practitioner

## 2014-06-15 MED ORDER — PANTOPRAZOLE SODIUM 40 MG PO TBEC: 40.0000 mg | DELAYED_RELEASE_TABLET | Freq: Every day | ORAL | Status: DC

## 2014-07-03 ENCOUNTER — Other Ambulatory Visit: Payer: Self-pay | Admitting: Nurse Practitioner

## 2014-07-03 ENCOUNTER — Telehealth: Payer: Self-pay

## 2014-07-03 ENCOUNTER — Telehealth: Payer: Self-pay | Admitting: Family Medicine

## 2014-07-03 DIAGNOSIS — Z1211 Encounter for screening for malignant neoplasm of colon: Secondary | ICD-10-CM

## 2014-07-03 NOTE — Telephone Encounter (Signed)
Per Marzetta Board , disregard.

## 2014-07-03 NOTE — Telephone Encounter (Signed)
Pt called to say that [facility] told her she needs referral (due to throat issues and may need EGD), please initiate in system so that I may process Pt needs screening colonoscopy & possibly an EGD

## 2014-07-03 NOTE — Telephone Encounter (Signed)
done

## 2014-07-19 ENCOUNTER — Telehealth: Payer: Self-pay

## 2014-07-19 NOTE — Telephone Encounter (Signed)
Pt was referred by Pearson Forster, NP at [facility] for screening colonoscopy and possible EGD. I have LMOM for a return call.  If she is having symptoms for EGD she will need OV.

## 2014-07-21 NOTE — Telephone Encounter (Signed)
Letter mailed to pt to call.  

## 2014-07-25 ENCOUNTER — Telehealth: Payer: Self-pay

## 2014-07-25 ENCOUNTER — Telehealth: Payer: Self-pay | Admitting: General Practice

## 2014-07-25 NOTE — Telephone Encounter (Signed)
Patient called in to schedule her tcs she can be reached at [phone], please call her at the number listed.  If she does not answer leave a message and she will call back.  Routing to [employer].

## 2014-07-25 NOTE — Telephone Encounter (Signed)
Opened in error

## 2014-07-25 NOTE — Telephone Encounter (Signed)
I called pt and she is having some problems with getting strangled and feels like something in her throat sometimes. Scheduled and OV with Neil Crouch, PA  For 08/14/2014 at 10:00 Am. Pt prefers Dr. Oneida Alar to do her procedures.

## 2014-08-04 ENCOUNTER — Telehealth: Payer: Self-pay | Admitting: Family Medicine

## 2014-08-04 NOTE — Telephone Encounter (Signed)
Patient says that Hoyle Sauer changed her blood pressure medication to losartan 50 mg.  She said that this morning her blood pressure was 159/470 and the diastolic has been in the 761'H.  Please advise.

## 2014-08-05 NOTE — Telephone Encounter (Signed)
Increase losartan to 100 mg; monitor BP and call back within 2 weeks if still not at or below 140/90. If still no better, will switch med.

## 2014-08-07 ENCOUNTER — Other Ambulatory Visit: Payer: Self-pay | Admitting: *Deleted

## 2014-08-07 MED ORDER — LOSARTAN POTASSIUM 100 MG PO TABS: 100.0000 mg | ORAL_TABLET | Freq: Every day | ORAL | Status: DC

## 2014-08-07 NOTE — Telephone Encounter (Signed)
Discussed with pt. Med sent to pharm.  

## 2014-08-14 ENCOUNTER — Other Ambulatory Visit: Payer: Self-pay

## 2014-08-14 ENCOUNTER — Encounter: Payer: Self-pay | Admitting: Gastroenterology

## 2014-08-14 ENCOUNTER — Ambulatory Visit (INDEPENDENT_AMBULATORY_CARE_PROVIDER_SITE_OTHER): Payer: 59 | Admitting: Gastroenterology

## 2014-08-14 VITALS — BP 170/110 | HR 92 | Temp 98.2°F | Ht 64.0 in | Wt 175.2 lb

## 2014-08-14 DIAGNOSIS — R1310 Dysphagia, unspecified: Secondary | ICD-10-CM

## 2014-08-14 DIAGNOSIS — R1314 Dysphagia, pharyngoesophageal phase: Secondary | ICD-10-CM

## 2014-08-14 DIAGNOSIS — K219 Gastro-esophageal reflux disease without esophagitis: Secondary | ICD-10-CM | POA: Diagnosis not present

## 2014-08-14 DIAGNOSIS — I10 Essential (primary) hypertension: Secondary | ICD-10-CM | POA: Diagnosis not present

## 2014-08-14 DIAGNOSIS — Z1211 Encounter for screening for malignant neoplasm of colon: Secondary | ICD-10-CM

## 2014-08-14 MED ORDER — PEG 3350-KCL-NA BICARB-NACL 420 G PO SOLR: 4000.0000 mL | Freq: Once | ORAL | Status: DC

## 2014-08-14 NOTE — Progress Notes (Signed)
Cc'd to pcp 

## 2014-08-14 NOTE — Assessment & Plan Note (Signed)
Chronic GERD, some refractory symptoms requiring twice a day dosing. Complains of a lump in her throat when swallowing. May be related to reflux, unlikely related to thyroid nodules given size, she also has a history of prior neck surgery. Complains of "strangling" on her saliva and sometimes when eating. Discussed with patient. Denies any typical solid food dysphagia. Symptoms may be multifactorial including refractory GERD. Given chronicity of her symptoms and lack of response to PPI BID (by her PCP), offered her an upper endoscopy plus or minus esophageal dilation with Dr. Oneida Alar.  I have discussed the risks, alternatives, benefits with regards to but not limited to the risk of reaction to medication, bleeding, infection, perforation and the patient is agreeable to proceed. Written consent to be obtained.

## 2014-08-14 NOTE — Assessment & Plan Note (Signed)
Screening colonoscopy in the near future.  I have discussed the risks, alternatives, benefits with regards to but not limited to the risk of reaction to medication, bleeding, infection, perforation and the patient is agreeable to proceed. Written consent to be obtained.

## 2014-08-14 NOTE — Patient Instructions (Signed)
Upper endoscopy and colonoscopy with Dr. Oneida Alar. Please see separate instructions.

## 2014-08-14 NOTE — Progress Notes (Signed)
Primary Care Physician:  Sallee Lange, MD  Primary Gastroenterologist:  Barney Drain, MD   Chief Complaint  Patient presents with  . Colonoscopy  . EGD    HPI:  Sandra Trujillo is a 54 y.o. female here at the request of PCP for consideration of colonoscopy and possible upper endoscopy.  Patient states she is having no difficulty and desires a screening colonoscopy. Her last one was with Dr. Laural Golden back in 2004. Her colon and terminal ileum appeared normal. Also had an EGD at that time for IDA which showed mild changes of reflux esophagitis. 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 Patient reports that she's been on PPI therapy for quite some time. She's been having some refractory symptoms sometimes needing an additional dose of protonic's during the evening. Denies any solid food dysphagia. She complains of the sensation of a lump in her throat especially when she swallows. Not sure if PND, GERD, thyroid nodules. Also notes that she gets "strangled" while eating but at other times when she is not eating. Saliva goes down the wrong way. States she was told she had thyroid nodules which are benign. Ultrasound report states they did not meet criteria for biopsies. She's not sure of follow-up on this. She's had prior neck surgery. Denies any abdominal pain. Bowel movements are normal for her, alternate between constipation and diarrhea. Denies any blood in the stool or melena. She has IUD in place and has infrequent bleeding.    Current Outpatient Prescriptions  Medication Sig Dispense Refill  . aspirin 81 MG tablet Take 81 mg by mouth daily.    . fluticasone (FLONASE) 50 MCG/ACT nasal spray Place 2 sprays into both nostrils daily. 48 g 1  . losartan (COZAAR) 100 MG tablet Take 1 tablet (100 mg total) by mouth daily. 90 tablet 0  . pantoprazole (PROTONIX) 40 MG tablet Take 1 tablet (40 mg total) by mouth daily. 90 tablet 1  . Vitamin D, Ergocalciferol, (DRISDOL) 50000 UNITS  CAPS capsule Take 1 capsule (50,000 Units total) by mouth every 7 (seven) days. 4 capsule 1   No current facility-administered medications for this visit.    Allergies as of 08/14/2014 - Review Complete 08/14/2014  Allergen Reaction Noted  . Sulfa antibiotics  12/16/2012  . Lisinopril Cough 05/22/2014    Past Medical History  Diagnosis Date  . Hypertension   . Hyperlipidemia   . GERD (gastroesophageal reflux disease)     Past Surgical History  Procedure Laterality Date  . Colonoscopy with esophagogastroduodenoscopy (egd)  12/2002    VVO:HYWVP HH/mildchanges of reflux/otherwise normal. normal colonoscopy and terminal ileoscopy.    Family History  Problem Relation Age of Onset  . Hypertension Father   . Heart failure Mother   . Stroke Mother   . Colon cancer Neg Hx   . Inflammatory bowel disease Neg Hx   . Liver disease Neg Hx     History   Social History  . Marital Status: Married    Spouse Name: N/A  . Number of Children: 1  . Years of Education: N/A   Occupational History  . [employer]    Social History Main Topics  . Smoking status: Current Some Day Smoker  . Smokeless tobacco: Not on file  . Alcohol Use: No  . Drug Use: No  . Sexual Activity: Not on file   Other Topics Concern  . Not on file   Social History Narrative      ROS:  General: Negative for anorexia, weight loss, fever, chills, fatigue, weakness. Eyes: Negative for vision changes.  ENT: Negative for hoarseness, difficulty swallowing , nasal congestion. CV: Negative for chest pain, angina, palpitations, dyspnea on exertion, peripheral edema.  Respiratory: Negative for dyspnea at rest, dyspnea on exertion, cough, sputum, wheezing.  GI: See history of present illness. GU:  Negative for dysuria, hematuria, urinary incontinence, urinary frequency, nocturnal urination.  MS: Negative for joint pain, low back pain.  Derm: Negative for rash or itching.  Neuro: Negative for weakness, abnormal  sensation, seizure, frequent headaches, memory loss, confusion.  Psych: Negative for anxiety, depression, suicidal ideation, hallucinations.  Endo: Negative for unusual weight change.  Heme: Negative for bruising or bleeding. Allergy: Negative for rash or hives.    Physical Examination:  BP 170/110 mmHg  Pulse 92  Temp(Src) 98.2 F (36.8 C) (Oral)  Ht 5\' 4"  (1.626 m)  Wt 175 lb 3.2 oz (79.47 kg)  BMI 30.06 kg/m2   General: Well-nourished, well-developed in no acute distress.  Head: Normocephalic, atraumatic.   Eyes: Conjunctiva pink, no icterus. Mouth: Oropharyngeal mucosa moist and pink , no lesions erythema or exudate. Neck: Supple without thyromegaly, masses, or lymphadenopathy.  Lungs: Clear to auscultation bilaterally.  Heart: Regular rate and rhythm, no murmurs rubs or gallops.  Abdomen: Bowel sounds are normal, nontender, nondistended, no hepatosplenomegaly or masses, no abdominal bruits or    hernia , no rebound or guarding.   Rectal: deferred Extremities: No lower extremity edema. No clubbing or deformities.  Neuro: Alert and oriented x 4 , grossly normal neurologically.  Skin: Warm and dry, no rash or jaundice.   Psych: Alert and cooperative, normal mood and affect.  Labs: Lab Results  Component Value Date   CREATININE 1.07* 05/25/2014   BUN 15 05/25/2014   NA 141 05/25/2014   K 4.5 05/25/2014   CL 103 05/25/2014   CO2 22 05/25/2014   Lab Results  Component Value Date   ALT 14 05/25/2014   AST 16 05/25/2014   ALKPHOS 83 05/25/2014   BILITOT 0.7 05/25/2014   Lab Results  Component Value Date   TSH 0.934 05/25/2014     Imaging Studies: No results found.

## 2014-08-14 NOTE — Assessment & Plan Note (Signed)
BP elevated today. Advised her to contact PCP for recommendations. Per patient, she has had BP up when she checks it at home as well.

## 2014-08-25 ENCOUNTER — Other Ambulatory Visit: Payer: Self-pay | Admitting: Nurse Practitioner

## 2014-08-25 ENCOUNTER — Telehealth: Payer: Self-pay | Admitting: Family Medicine

## 2014-08-25 MED ORDER — HYDROCHLOROTHIAZIDE 25 MG PO TABS: 25.0000 mg | ORAL_TABLET | Freq: Every day | ORAL | Status: DC

## 2014-08-25 NOTE — Telephone Encounter (Signed)
Can she take HCTZ?

## 2014-08-25 NOTE — Telephone Encounter (Signed)
Sent in HCTZ. Continue to check BP and call back if not below 140/90.

## 2014-08-25 NOTE — Telephone Encounter (Signed)
Patient states she used to on it and took it fine but she was taken off of the med when she was switched to new bp med

## 2014-08-25 NOTE — Telephone Encounter (Signed)
Left message notifing pt that hctz was sent in and to monitor bp and call back if not under 140/90

## 2014-08-25 NOTE — Telephone Encounter (Signed)
Patient was seen by Hoyle Sauer on 4/1 and given a prescription for cozaar 50 mg.dosage was increase to 100 mg on 6/20 but pressure wont get lower than 150/96. Patient wants to know is their another medication she can take to help her blood pressure get stable. Call into [facility].

## 2014-09-15 ENCOUNTER — Ambulatory Visit (HOSPITAL_COMMUNITY)
Admission: RE | Admit: 2014-09-15 | Discharge: 2014-09-15 | Disposition: A | Discharge status: Home / Self Care | Payer: 59 | Source: Ambulatory Visit | Attending: Gastroenterology | Admitting: Gastroenterology

## 2014-09-15 ENCOUNTER — Encounter (HOSPITAL_COMMUNITY): Payer: Self-pay | Admitting: *Deleted

## 2014-09-15 ENCOUNTER — Encounter: Payer: Self-pay | Admitting: Gastroenterology

## 2014-09-15 ENCOUNTER — Encounter (HOSPITAL_COMMUNITY)
Admission: RE | Disposition: A | Discharge status: Home / Self Care | Payer: Self-pay | Source: Ambulatory Visit | Attending: Gastroenterology

## 2014-09-15 DIAGNOSIS — Z7982 Long term (current) use of aspirin: Secondary | ICD-10-CM | POA: Diagnosis not present

## 2014-09-15 DIAGNOSIS — E785 Hyperlipidemia, unspecified: Secondary | ICD-10-CM | POA: Diagnosis not present

## 2014-09-15 DIAGNOSIS — Z1211 Encounter for screening for malignant neoplasm of colon: Secondary | ICD-10-CM | POA: Diagnosis not present

## 2014-09-15 DIAGNOSIS — K222 Esophageal obstruction: Secondary | ICD-10-CM | POA: Insufficient documentation

## 2014-09-15 DIAGNOSIS — Q394 Esophageal web: Secondary | ICD-10-CM | POA: Diagnosis not present

## 2014-09-15 DIAGNOSIS — K319 Disease of stomach and duodenum, unspecified: Secondary | ICD-10-CM | POA: Diagnosis not present

## 2014-09-15 DIAGNOSIS — R1310 Dysphagia, unspecified: Secondary | ICD-10-CM | POA: Insufficient documentation

## 2014-09-15 DIAGNOSIS — K219 Gastro-esophageal reflux disease without esophagitis: Secondary | ICD-10-CM | POA: Diagnosis not present

## 2014-09-15 DIAGNOSIS — Z7951 Long term (current) use of inhaled steroids: Secondary | ICD-10-CM | POA: Insufficient documentation

## 2014-09-15 DIAGNOSIS — I10 Essential (primary) hypertension: Secondary | ICD-10-CM | POA: Diagnosis not present

## 2014-09-15 DIAGNOSIS — K2990 Gastroduodenitis, unspecified, without bleeding: Secondary | ICD-10-CM | POA: Diagnosis not present

## 2014-09-15 DIAGNOSIS — K5730 Diverticulosis of large intestine without perforation or abscess without bleeding: Secondary | ICD-10-CM | POA: Diagnosis not present

## 2014-09-15 DIAGNOSIS — R1314 Dysphagia, pharyngoesophageal phase: Secondary | ICD-10-CM | POA: Diagnosis not present

## 2014-09-15 DIAGNOSIS — K2970 Gastritis, unspecified, without bleeding: Secondary | ICD-10-CM

## 2014-09-15 DIAGNOSIS — Q438 Other specified congenital malformations of intestine: Secondary | ICD-10-CM | POA: Diagnosis not present

## 2014-09-15 DIAGNOSIS — K259 Gastric ulcer, unspecified as acute or chronic, without hemorrhage or perforation: Secondary | ICD-10-CM | POA: Insufficient documentation

## 2014-09-15 DIAGNOSIS — K639 Disease of intestine, unspecified: Secondary | ICD-10-CM | POA: Diagnosis not present

## 2014-09-15 DIAGNOSIS — F17210 Nicotine dependence, cigarettes, uncomplicated: Secondary | ICD-10-CM | POA: Insufficient documentation

## 2014-09-15 DIAGNOSIS — K2960 Other gastritis without bleeding: Secondary | ICD-10-CM | POA: Insufficient documentation

## 2014-09-15 DIAGNOSIS — K2980 Duodenitis without bleeding: Secondary | ICD-10-CM | POA: Insufficient documentation

## 2014-09-15 HISTORY — PX: ESOPHAGOGASTRODUODENOSCOPY: SHX5428

## 2014-09-15 HISTORY — PX: SAVORY DILATION: SHX5439

## 2014-09-15 HISTORY — PX: COLONOSCOPY: SHX5424

## 2014-09-15 SURGERY — COLONOSCOPY
Anesthesia: Moderate Sedation

## 2014-09-15 MED ORDER — MIDAZOLAM HCL 5 MG/5ML IJ SOLN
INTRAMUSCULAR | Status: DC | PRN
  Administered 2014-09-15 (×3): 1 mg via INTRAVENOUS
  Administered 2014-09-15 (×2): 2 mg via INTRAVENOUS

## 2014-09-15 MED ORDER — MINERAL OIL PO OIL
TOPICAL_OIL | ORAL | Status: AC
  Filled 2014-09-15: qty 30

## 2014-09-15 MED ORDER — MEPERIDINE HCL 100 MG/ML IJ SOLN
INTRAMUSCULAR | Status: AC
  Filled 2014-09-15: qty 2

## 2014-09-15 MED ORDER — LIDOCAINE VISCOUS 2 % MT SOLN
OROMUCOSAL | Status: DC | PRN
  Administered 2014-09-15: 1 via OROMUCOSAL

## 2014-09-15 MED ORDER — SODIUM CHLORIDE 0.9 % IV SOLN
INTRAVENOUS | Status: DC
  Administered 2014-09-15: 07:00:00 via INTRAVENOUS

## 2014-09-15 MED ORDER — LIDOCAINE VISCOUS 2 % MT SOLN
OROMUCOSAL | Status: AC
  Filled 2014-09-15: qty 15

## 2014-09-15 MED ORDER — PANTOPRAZOLE SODIUM 40 MG PO TBEC: 40.0000 mg | DELAYED_RELEASE_TABLET | Freq: Two times a day (BID) | ORAL | Status: DC

## 2014-09-15 MED ORDER — MEPERIDINE HCL 100 MG/ML IJ SOLN
INTRAMUSCULAR | Status: DC | PRN
  Administered 2014-09-15 (×4): 25 mg via INTRAVENOUS

## 2014-09-15 MED ORDER — MIDAZOLAM HCL 5 MG/5ML IJ SOLN
INTRAMUSCULAR | Status: DC
Start: 2014-09-15 — End: 2014-09-15
  Filled 2014-09-15: qty 10

## 2014-09-15 NOTE — Progress Notes (Signed)
REVIEWED-NO ADDITIONAL RECOMMENDATIONS. 

## 2014-09-15 NOTE — Op Note (Addendum)
[facility] [address] [place], [address]   ENDOSCOPY PROCEDURE REPORT  PATIENT: Sandra Trujillo, Sandra Trujillo  MR#: [id number] BIRTHDATE: Jun 24, 1960 , 53  yrs. old GENDER: female  ENDOSCOPIST: Danie Binder, MD REFFERED UM:PNTIRWE Wolfgang Phoenix, M.D. PROCEDURE DATE:  10-03-14 PROCEDURE:   EGD with biopsy and EGD with dilatation over guidewire   INDICATIONS:1.  dysphagia. MEDICATIONS: Demerol 25 mg IV and Versed 2 mg IV TOPICAL ANESTHETIC: Viscous Xylocaine  DESCRIPTION OF PROCEDURE:   After the risks benefits and alternatives of the procedure were thoroughly explained, informed consent was obtained.  The [id number] ([id number])  endoscope was introduced through the mouth and advanced to the second portion of the duodenum. The instrument was slowly withdrawn as the mucosa was carefully examined.  Prior to withdrawal of the scope, the guidwire was placed.  The esophagus was dilated successfully.  The patient was recovered in endoscopy and discharged home in satisfactory condition. Estimated blood loss is zero unless otherwise noted in this procedure report.   ESOPHAGUS: A mildly severe Schatzki ring was found at the gastroesophageal junction.   STOMACH: Moderate erosive gastritis (inflammation) was found in the gastric antrum.  Multiple biopsies were performed using cold forceps.   Three small clean-based and linear ulcers with surrounding edema were found in the gastric antrum.  Biopsies were taken around the ulcers.   DUODENUM: Moderate duodenal inflammation was found in the bulb and second portion of the duodenum.   Dilation was then performed at the gastroesphageal junction Dilator: Savary over guidewire Size(s): 12.8-16 MM Resistance: moderate Heme: yes  COMPLICATIONS: There were no immediate complications.  ENDOSCOPIC IMPRESSION: 1.   Schatzki ring at the gastroesophageal junction 2.   MODERATE Erosive gastritis/DUODENITIS AND Three small GASTRIC ulcers DUE TO  ASA/IBUPROFEN  RECOMMENDATIONS: FOLLOW A HIGH FIBER/LOW FAT DIET. AVOID ASPIRIN, IBUPROFEN, MOTRIN, ALEVE, BC/GOODY POWDERS FOR 2 WEEKS.  USE TYLENOL FOR PAIN. INCREASE PROTONIX TO 30 MINUTES PRIOR TO MEALS BID. AWAIT BIOPSY RESULTS. REPEAT EGD IN 3 MOS. OPV IN 6 MOS. NEXT TCS IN 10 YEARS.   _______________________________ Lorrin MaisDanie Binder, MD 03-Oct-2014 3:50 PM Revised: 2014/10/03 3:50 PM  CPT CODES: ICD CODES:  The ICD and CPT codes recommended by this software are interpretations from the data that the clinical staff has captured with the software.  The verification of the translation of this report to the ICD and CPT codes and modifiers is the sole responsibility of the health care institution and practicing physician where this report was generated.  [facility]. will not be held responsible for the validity of the ICD and CPT codes included on this report.  AMA assumes no liability for data contained or not contained herein. CPT is a Designer, television/film set of the [employer].

## 2014-09-15 NOTE — Op Note (Signed)
[facility] [address] [place], [address]   COLONOSCOPY PROCEDURE REPORT  PATIENT: Sandra, Trujillo  MR#: [id number] BIRTHDATE: 05-Oct-1960 , 53  yrs. old GENDER: female ENDOSCOPIST: Danie Binder, MD REFERRED FW:YOVZC Wolfgang Phoenix, M.D. PROCEDURE DATE:  October 06, 2014 PROCEDURE:   Colonoscopy with biopsy INDICATIONS:average risk patient for colon cancer. MEDICATIONS: Demerol 75 mg IV and Versed 5 mg IV  DESCRIPTION OF PROCEDURE:    Physical exam was performed.  Informed consent was obtained from the patient after explaining the benefits, risks, and alternatives to procedure.  The patient was connected to monitor and placed in left lateral position. Continuous oxygen was provided by nasal cannula and IV medicine administered through an indwelling cannula.  After administration of sedation and rectal exam, the patients rectum was intubated and the [id number] ([id number])  colonoscope was advanced under direct visualization to the cecum.  The scope was removed slowly by carefully examining the color, texture, anatomy, and integrity mucosa on the way out.  The patient was recovered in endoscopy and discharged home in satisfactory condition. Estimated blood loss is zero unless otherwise noted in this procedure report.    COLON FINDINGS: SUBMUCOSAL LESION IN THE HEPATIC FLEXURE TUNNEL BIOPSIED VIA COLD FORCEPS, There was mild diverticulosis noted in the sigmoid colon and descending colon with associated tortuosity. , The colon was redundant.  Manual abdominal counter-pressure was used to reach the cecum, The examination was otherwise normal.  , and The entire examined rectum was normal.  PREP QUALITY: excellent.  CECAL W/D TIME: 15       minutes COMPLICATIONS: None  ENDOSCOPIC IMPRESSION: 1.   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   MILD diverticulosis in the sigmoid colon and descending colon 3.   The LEFT colon IS  redundant  RECOMMENDATIONS: FOLLOW A HIGH FIBER/LOW FAT DIET. AVOID ASPIRIN, IBUPROFEN, MOTRIN, ALEVE, BC/GOODY POWDERS FOR 2 WEEKS.  USE TYLENOL FOR PAIN. INCREASE PROTONIX TO 30 MINUTES PRIOR TO MEALS BID. AWAIT BIOPSY RESULTS. REPEAT EGD IN 3 MOS. OPV IN 6 MOS. NEXT TCS IN 10 YEARS. CONSIDER OVERTUBE.   ______________________________ eSignedDanie Binder, MD 10/06/14 3:49 PM    CPT CODES: ICD CODES:  The ICD and CPT codes recommended by this software are interpretations from the data that the clinical staff has captured with the software.  The verification of the translation of this report to the ICD and CPT codes and modifiers is the sole responsibility of the health care institution and practicing physician where this report was generated.  [facility]. will not be held responsible for the validity of the ICD and CPT codes included on this report.  AMA assumes no liability for data contained or not contained herein. CPT is a Designer, television/film set of the [employer].

## 2014-09-15 NOTE — Discharge Instructions (Signed)
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 You have EROSIVE gastritis AND A FEW ULCERS MOST LIKELY DUE TO ASA AND IBUPROFEN. 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    FOLLOW A HIGH FIBER/LOW FAT DIET. AVOID ITEMS THAT CAUSE BLOATING. SEE INFO BELOW.  AVOID ASPIRIN, IBUPROFEN, MOTRIN, ALEVE, BC/GOODY POWDERS FOR 2 WEEKS. USE TYLENOL FOR PAIN.  CONTINUE PROTONIX. INCREASE TO 30 MINUTES PRIOR TO MEALS TWICE DAILY.  YOUR BIOPSY RESULTS WILL BE AVAILABLE IN MY CHART AFTER AUG 1 AND MY OFFICE WILL CONTACT YOU IN 10-14 DAYS WITH YOUR RESULTS.   REPEAT EGD IN 3 MOS.  FOLLOW UP IN 6 MOS.   Next colonoscopy in 10 years.   ENDOSCOPY Care After Read the instructions outlined below and refer to this sheet in the next week. These discharge instructions provide you with general information on caring for yourself after you leave the hospital. While your treatment has been planned according to the most current medical practices available, unavoidable complications occasionally occur. If you have any problems or questions after discharge, call DR. Noriel Guthrie, [phone].  ACTIVITY  You may resume your regular activity, but move at a slower pace for the next 24 hours.   Take frequent rest periods for the next 24 hours.   Walking will help get rid of the air and reduce the bloated feeling in your belly (abdomen).   No driving for 24 hours (because of the medicine (anesthesia) used during the test).   You may shower.   Do not sign any important legal documents or operate any machinery for 24 hours (because of the anesthesia used during the test).    NUTRITION  Drink plenty of fluids.   You may resume your normal diet as instructed by your doctor.   Begin with a light meal and progress to your normal diet. Heavy or fried foods are harder to digest and may make you feel sick to your stomach  (nauseated).   Avoid alcoholic beverages for 24 hours or as instructed.    MEDICATIONS  You may resume your normal medications.   WHAT YOU CAN EXPECT TODAY  Some feelings of bloating in the abdomen.   Passage of more gas than usual.   Spotting of blood in your stool or on the toilet paper  .  IF YOU HAD POLYPS REMOVED DURING THE ENDOSCOPY:  Eat a soft diet IF YOU HAVE NAUSEA, BLOATING, ABDOMINAL PAIN, OR VOMITING.    FINDING OUT THE RESULTS OF YOUR TEST Not all test results are available during your visit. DR. Oneida Alar WILL CALL YOU WITHIN 14 DAYS OF YOUR PROCEDUE WITH YOUR RESULTS. Do not assume everything is normal if you have not heard from DR. Akasha Melena, CALL HER OFFICE AT [phone].  SEEK IMMEDIATE MEDICAL ATTENTION AND CALL THE OFFICE: [phone] IF:  You have more than a spotting of blood in your stool.   Your belly is swollen (abdominal distention).   You are nauseated or vomiting.   You have a temperature over 101F.   You have abdominal pain or discomfort that is severe or gets worse throughout the day.  Ulcer Disease (Peptic Ulcer, Gastric Ulcer, Duodenal Ulcer) You have an ulcer. This may be in your stomach (gastric ulcer) or in the first part of your small bowel, the duodenum (duodenal ulcer). An ulcer is a break in the lining of the stomach or duodenum. The ulcer  causes erosion into the deeper tissue.  CAUSES The stomach has a lining to protect itself from the acid that digests food. The lining can be damaged in two main ways:  The [place]ico Pylori bacteria (H. Pylori) can infect the lining of the stomach and cause ulcers.   Nonsteroidal, anti-inflammatory medications (NSAIDS) can cause gastric ulcerations.   Smoking tobacco can increase the acid in the stomach. This can lead to ulcers, and will impair healing of ulcers.   SYMPTOMS The problems (symptoms) of ulcer disease are usually a burning or gnawing of the mid-upper belly (abdomen). This is often  worse on an empty stomach and may get better with food. This may be associated with feeling sick to your stomach (nausea), bloating, and vomiting.  HOME CARE INSTRUCTIONS Continue regular work and usual activities unless advised otherwise by your caregiver.  Avoid tobacco, alcohol, and caffeine. Tobacco use will decrease and slow the rates of healing.   Avoid foods that seem to aggravate or cause discomfort.   Special diets are not usually needed.     Gastritis  Gastritis is an inflammation (the body's way of reacting to injury and/or infection) of the stomach. It is often caused by viral or bacterial (germ) infections. It can also be caused BY ASPIRIN, BC/GOODY POWDER'S, (IBUPROFEN) MOTRIN, OR ALEVE (NAPROXEN), chemicals (including alcohol), SPICY FOODS, and medications. This illness may be associated with generalized malaise (feeling tired, not well), UPPER ABDOMINAL STOMACH cramps, and fever. One common bacterial cause of gastritis is an organism known as H. Pylori. This can be treated with antibiotics.    High-Fiber Diet A high-fiber diet changes your normal diet to include more whole grains, legumes, fruits, and vegetables. Changes in the diet involve replacing refined carbohydrates with unrefined foods. The calorie level of the diet is essentially unchanged. The Dietary Reference Intake (recommended amount) for adult males is 38 grams per day. For adult females, it is 25 grams per day. Pregnant and lactating women should consume 28 grams of fiber per day. Fiber is the intact part of a plant that is not broken down during digestion. Functional fiber is fiber that has been isolated from the plant to provide a beneficial effect in the body. PURPOSE  Increase stool bulk.   Ease and regulate bowel movements.   Lower cholesterol.  REDUCE RISK OF COLON CANCER  INDICATIONS THAT YOU NEED MORE FIBER  Constipation and hemorrhoids.   Uncomplicated diverticulosis (intestine condition) and  irritable bowel syndrome.   Weight management.   As a protective measure against hardening of the arteries (atherosclerosis), diabetes, and cancer.   GUIDELINES FOR INCREASING FIBER IN THE DIET  Start adding fiber to the diet slowly. A gradual increase of about 5 more grams (2 slices of whole-wheat bread, 2 servings of most fruits or vegetables, or 1 bowl of high-fiber cereal) per day is best. Too rapid an increase in fiber may result in constipation, flatulence, and bloating.   Drink enough water and fluids to keep your urine clear or pale yellow. Water, juice, or caffeine-free drinks are recommended. Not drinking enough fluid may cause constipation.   Eat a variety of high-fiber foods rather than one type of fiber.   Try to increase your intake of fiber through using high-fiber foods rather than fiber pills or supplements that contain small amounts of fiber.   The goal is to change the types of food eaten. Do not supplement your present diet with high-fiber foods, but replace foods in your present diet.  INCLUDE A VARIETY OF FIBER SOURCES  Replace refined and processed grains with whole grains, canned fruits with fresh fruits, and incorporate other fiber sources. White rice, white breads, and most bakery goods contain little or no fiber.   Brown whole-grain rice, buckwheat oats, and many fruits and vegetables are all good sources of fiber. These include: broccoli, Brussels sprouts, cabbage, cauliflower, beets, sweet potatoes, white potatoes (skin on), carrots, tomatoes, eggplant, squash, berries, fresh fruits, and dried fruits.   Cereals appear to be the richest source of fiber. Cereal fiber is found in whole grains and bran. Bran is the fiber-rich outer coat of cereal grain, which is largely removed in refining. In whole-grain cereals, the bran remains. In breakfast cereals, the largest amount of fiber is found in those with "bran" in their names. The fiber content is sometimes indicated  on the label.   You may need to include additional fruits and vegetables each day.   In baking, for 1 cup white flour, you may use the following substitutions:   1 cup whole-wheat flour minus 2 tablespoons.   1/2 cup white flour plus 1/2 cup whole-wheat flour.    Low-Fat Diet BREADS, CEREALS, PASTA, RICE, DRIED PEAS, AND BEANS These products are high in carbohydrates and most are low in fat. Therefore, they can be increased in the diet as substitutes for fatty foods. They too, however, contain calories and should not be eaten in excess. Cereals can be eaten for snacks as well as for breakfast.  Include foods that contain fiber (fruits, vegetables, whole grains, and legumes). Research shows that fiber may lower blood cholesterol levels, especially the water-soluble fiber found in fruits, vegetables, oat products, and legumes. FRUITS AND VEGETABLES It is good to eat fruits and vegetables. Besides being sources of fiber, both are rich in vitamins and some minerals. They help you get the daily allowances of these nutrients. Fruits and vegetables can be used for snacks and desserts. MEATS Limit lean meat, chicken, [place], and fish to no more than 6 ounces per day. Beef, Pork, and Lamb Use lean cuts of beef, pork, and lamb. Lean cuts include:  Extra-lean ground beef.  Arm roast.  Sirloin tip.  Center-cut ham.  Round steak.  Loin chops.  Rump roast.  Tenderloin.  Trim all fat off the outside of meats before cooking. It is not necessary to severely decrease the intake of red meat, but lean choices should be made. Lean meat is rich in protein and contains a highly absorbable form of iron. Premenopausal women, in particular, should avoid reducing lean red meat because this could increase the risk for low red blood cells (iron-deficiency anemia).  Chicken and [place] These are good sources of protein. The fat of poultry can be reduced by removing the skin and underlying fat layers before cooking.  Chicken and [place] can be substituted for lean red meat in the diet. Poultry should not be fried or covered with high-fat sauces. Fish and Shellfish Fish is a good source of protein. Shellfish contain cholesterol, but they usually are low in saturated fatty acids. The preparation of fish is important. Like chicken and [place], they should not be fried or covered with high-fat sauces. EGGS Egg whites contain no fat or cholesterol. They can be eaten often. Try 1 to 2 egg whites instead of whole eggs in recipes or use egg substitutes that do not contain yolk.  MILK AND DAIRY PRODUCTS Use skim or 1% milk instead of 2% or whole milk. Decrease whole  milk, natural, and processed cheeses. Use nonfat or low-fat (2%) cottage cheese or low-fat cheeses made from vegetable oils. Choose nonfat or low-fat (1 to 2%) yogurt. Experiment with evaporated skim milk in recipes that call for heavy cream. Substitute low-fat yogurt or low-fat cottage cheese for sour cream in dips and salad dressings. Have at least 2 servings of low-fat dairy products, such as 2 glasses of skim (or 1%) milk each day to help get your daily calcium intake.  FATS AND OILS Butterfat, lard, and beef fats are high in saturated fat and cholesterol. These should be avoided.Vegetable fats do not contain cholesterol. AVOID coconut oil, palm oil, and palm kernel oil, WHICH are very high in saturated fats. These should be limited. These fats are often used in bakery goods, processed foods, popcorn, oils, and nondairy creamers. Vegetable shortenings and some peanut butters contain hydrogenated oils, which are also saturated fats. Read the labels on these foods and check for saturated vegetable oils.  Desirable liquid vegetable oils are corn oil, cottonseed oil, olive oil, canola oil, safflower oil, soybean oil, and sunflower oil. Peanut oil is not as good, but small amounts are acceptable. Buy a heart-healthy tub margarine that has no partially hydrogenated  oils in the ingredients. AVOID Mayonnaise and salad dressings often are made from unsaturated fats.  OTHER EATING TIPS Snacks  Most sweets should be limited as snacks. They tend to be rich in calories and fats, and their caloric content outweighs their nutritional value. Some good choices in snacks are graham crackers, melba toast, soda crackers, bagels (no egg), English muffins, fruits, and vegetables. These snacks are preferable to snack crackers, [place] fries, and chips. Popcorn should be air-popped or cooked in small amounts of liquid vegetable oil.  Desserts Eat fruit, low-fat yogurt, and fruit ices instead of pastries, cake, and cookies. Sherbet, angel food cake, gelatin dessert, frozen low-fat yogurt, or other frozen products that do not contain saturated fat (pure fruit juice bars, frozen ice pops) are also acceptable.   COOKING METHODS Choose those methods that use little or no fat. They include: Poaching.  Braising.  Steaming.  Grilling.  Baking.  Stir-frying.  Broiling.  Microwaving.  Foods can be cooked in a nonstick pan without added fat, or use a nonfat cooking spray in regular cookware. Limit fried foods and avoid frying in saturated fat. Add moisture to lean meats by using water, broth, cooking wines, and other nonfat or low-fat sauces along with the cooking methods mentioned above. Soups and stews should be chilled after cooking. The fat that forms on top after a few hours in the refrigerator should be skimmed off. When preparing meals, avoid using excess salt. Salt can contribute to raising blood pressure in some people.  EATING AWAY FROM HOME Order entres, potatoes, and vegetables without sauces or butter. When meat exceeds the size of a deck of cards (3 to 4 ounces), the rest can be taken home for another meal. Choose vegetable or fruit salads and ask for low-calorie salad dressings to be served on the side. Use dressings sparingly. Limit high-fat toppings, such as bacon,  crumbled eggs, cheese, sunflower seeds, and olives. Ask for heart-healthy tub margarine instead of butter.   Polyps, Colon  A polyp is extra tissue that grows inside your body. Colon polyps grow in the large intestine. The large intestine, also called the colon, is part of your digestive system. It is a long, hollow tube at the end of your digestive tract where your body makes  and stores stool. Most polyps are not dangerous. They are benign. This means they are not cancerous. But over time, some types of polyps can turn into cancer. Polyps that are smaller than a pea are usually not harmful. But larger polyps could someday become or may already be cancerous. To be safe, doctors remove all polyps and test them.   PREVENTION There is not one sure way to prevent polyps. You might be able to lower your risk of getting them if you:  Eat more fruits and vegetables and less fatty food.   Do not smoke.   Avoid alcohol.   Exercise every day.   Lose weight if you are overweight.   Eating more calcium and folate can also lower your risk of getting polyps. Some foods that are rich in calcium are milk, cheese, and broccoli. Some foods that are rich in folate are chickpeas, kidney beans, and spinach.   Diverticulosis Diverticulosis is a common condition that develops when small pouches (diverticula) form in the wall of the colon. The risk of diverticulosis increases with age. It happens more often in people who eat a low-fiber diet. Most individuals with diverticulosis have no symptoms. Those individuals with symptoms usually experience belly (abdominal) pain, constipation, or loose stools (diarrhea).  HOME CARE INSTRUCTIONS  Increase the amount of fiber in your diet as directed by your caregiver or dietician. This may reduce symptoms of diverticulosis.   Drink at least 6 to 8 glasses of water each day to prevent constipation.   Try not to strain when you have a bowel movement.   Avoiding nuts and  seeds to prevent complications is NOT NECESSARY.    FOODS HAVING HIGH FIBER CONTENT INCLUDE:  Fruits. Apple, peach, pear, tangerine, raisins, prunes.   Vegetables. Brussels sprouts, asparagus, broccoli, cabbage, carrot, cauliflower, romaine lettuce, spinach, summer squash, tomato, winter squash, zucchini.   Starchy Vegetables. Baked beans, kidney beans, lima beans, split peas, lentils, potatoes (with skin).   Grains. Whole wheat bread, brown rice, bran flake cereal, plain oatmeal, white rice, shredded wheat, bran muffins.   SEEK IMMEDIATE MEDICAL CARE IF:  You develop increasing pain or severe bloating.   You have an oral temperature above 101F.   You develop vomiting or bowel movements that are bloody or black.

## 2014-09-15 NOTE — H&P (Signed)
  Primary Care Physician:  Sallee Lange, MD Primary Gastroenterologist:  Dr. Oneida Alar  Pre-Procedure History & Physical: HPI:  Sandra Trujillo is a 54 y.o. female here for DYSPHAGIA/screening.  Past Medical History  Diagnosis Date  . Hypertension   . Hyperlipidemia   . GERD (gastroesophageal reflux disease)     Past Surgical History  Procedure Laterality Date  . Colonoscopy with esophagogastroduodenoscopy (egd)  12/2002    HDQ:QIWLN HH/mildchanges of reflux/otherwise normal. normal colonoscopy and terminal ileoscopy.  . Cervical discectomy      Prior to Admission medications   Medication Sig Start Date End Date Taking? Authorizing Provider  aspirin 81 MG tablet Take 81 mg by mouth daily.   Yes Historical Provider, MD  Cholecalciferol (VITAMIN D) 2000 UNITS CAPS Take 1 capsule by mouth daily.   Yes Historical Provider, MD  fluticasone (FLONASE) 50 MCG/ACT nasal spray Place 2 sprays into both nostrils daily. 05/19/14  Yes Nilda Simmer, NP  hydrochlorothiazide (HYDRODIURIL) 25 MG tablet Take 1 tablet (25 mg total) by mouth daily. 08/25/14  Yes Nilda Simmer, NP  ibuprofen (ADVIL,MOTRIN) 200 MG tablet Take 800 mg by mouth every 6 (six) hours as needed for headache or cramping.   Yes Historical Provider, MD  losartan (COZAAR) 100 MG tablet Take 1 tablet (100 mg total) by mouth daily. 08/07/14  Yes Nilda Simmer, NP  pantoprazole (PROTONIX) 40 MG tablet Take 1 tablet (40 mg total) by mouth daily. 06/15/14  Yes Nilda Simmer, NP  polyethylene glycol-electrolytes (NULYTELY/GOLYTELY) 420 G solution Take 4,000 mLs by mouth once. 08/14/14  Yes Mahala Menghini, PA-C    Allergies as of 08/14/2014 - Review Complete 08/14/2014  Allergen Reaction Noted  . Sulfa antibiotics  12/16/2012  . Lisinopril Cough 05/22/2014    Family History  Problem Relation Age of Onset  . Hypertension Father   . Heart failure Mother   . Stroke Mother   . Colon cancer Neg Hx   . Inflammatory bowel disease  Neg Hx   . Liver disease Neg Hx     History   Social History  . Marital Status: Married    Spouse Name: N/A  . Number of Children: 1  . Years of Education: N/A   Occupational History  . [employer]    Social History Main Topics  . Smoking status: Current Every Day Smoker -- 1.00 packs/day for 25 years    Types: Cigarettes  . Smokeless tobacco: Not on file  . Alcohol Use: No  . Drug Use: No  . Sexual Activity: Not on file   Other Topics Concern  . Not on file   Social History Narrative   Review of Systems: See HPI, otherwise negative ROS  Physical Exam: BP 132/100 mmHg  Pulse 90  Temp(Src) 97.8 F (36.6 C) (Oral)  Resp 17  Ht 5\' 4"  (1.626 m)  Wt 167 lb (75.751 kg)  BMI 28.65 kg/m2  SpO2 100% General:   Alert,  pleasant and cooperative in NAD Head:  Normocephalic and atraumatic. Neck:  Supple; Lungs:  Clear throughout to auscultation.    Heart:  Regular rate and rhythm. Abdomen:  Soft, nontender and nondistended. Normal bowel sounds, without guarding, and without rebound.   Neurologic:  Alert and  oriented x4;  grossly normal neurologically.  Impression/Plan:    DYSPHAGIA/screening  PLAN:  EGD/DIL/TCS TODAY

## 2014-09-19 ENCOUNTER — Encounter (HOSPITAL_COMMUNITY): Payer: Self-pay | Admitting: Gastroenterology

## 2014-09-22 ENCOUNTER — Telehealth: Payer: Self-pay | Admitting: Gastroenterology

## 2014-09-22 NOTE — Telephone Encounter (Signed)
Tried to call with no answer  

## 2014-09-22 NOTE — Telephone Encounter (Signed)
Please call pt. HER stomach Bx shows gastritis DUE TO ASA/IBUPROFEN. THE ESOPHAGUS AND COLON BIOPSIES ARE NORMAL.   FOLLOW A HIGH FIBER/LOW FAT DIET. AVOID ITEMS THAT CAUSE BLOATING.   AVOID ASPIRIN, IBUPROFEN, MOTRIN, ALEVE, BC/GOODY POWDERS UNTIL AUG 12.Marland Kitchen USE TYLENOL FOR PAIN.  CONTINUE PROTONIX. INCREASE TO 30 MINUTES PRIOR TO MEALS TWICE DAILY.  REPEAT EGD IN 3 MOS TO MAKE SURE THE ULCERS HAVE HEALED AND RE-ASSESS SWALLOWING.  FOLLOW UP IN 6 MOS E30 DYSPHAGIA/PUD.   Next colonoscopy in 10 years.

## 2014-09-25 NOTE — Telephone Encounter (Signed)
REMINDER IN EPIC °

## 2014-09-25 NOTE — Telephone Encounter (Signed)
LMOM to call.

## 2014-09-26 NOTE — Telephone Encounter (Signed)
Pt is aware of results. 

## 2014-10-25 ENCOUNTER — Telehealth: Payer: Self-pay | Admitting: Gastroenterology

## 2014-10-25 NOTE — Telephone Encounter (Signed)
ON RECALL FOR REPEAT 3 MONTH ENDO

## 2014-10-26 NOTE — Telephone Encounter (Signed)
egd is due 11/2014

## 2014-11-20 ENCOUNTER — Encounter: Payer: Self-pay | Admitting: Nurse Practitioner

## 2014-11-20 ENCOUNTER — Ambulatory Visit (INDEPENDENT_AMBULATORY_CARE_PROVIDER_SITE_OTHER): Payer: 59 | Admitting: Nurse Practitioner

## 2014-11-20 VITALS — BP 128/88 | Ht 64.0 in | Wt 169.1 lb

## 2014-11-20 DIAGNOSIS — I10 Essential (primary) hypertension: Secondary | ICD-10-CM | POA: Diagnosis not present

## 2014-11-20 DIAGNOSIS — R350 Frequency of micturition: Secondary | ICD-10-CM

## 2014-11-20 DIAGNOSIS — J0120 Acute ethmoidal sinusitis, unspecified: Secondary | ICD-10-CM

## 2014-11-20 DIAGNOSIS — K219 Gastro-esophageal reflux disease without esophagitis: Secondary | ICD-10-CM

## 2014-11-20 LAB — POCT URINALYSIS DIPSTICK
Blood, UA: NEGATIVE
SPEC GRAV UA: 1.015
pH, UA: 5

## 2014-11-20 MED ORDER — PANTOPRAZOLE SODIUM 40 MG PO TBEC
40.0000 mg | DELAYED_RELEASE_TABLET | Freq: Two times a day (BID) | ORAL | Status: DC
Start: 2014-11-20 — End: 2015-05-21

## 2014-11-20 MED ORDER — LOSARTAN POTASSIUM 100 MG PO TABS: 100.0000 mg | ORAL_TABLET | Freq: Every day | ORAL | Status: DC

## 2014-11-20 MED ORDER — LEVOFLOXACIN 500 MG PO TABS: 500.0000 mg | ORAL_TABLET | Freq: Every day | ORAL | Status: DC

## 2014-11-21 ENCOUNTER — Encounter: Payer: Self-pay | Admitting: Nurse Practitioner

## 2014-11-21 NOTE — Progress Notes (Signed)
Subjective:  Presents for recheck on BP. Outside of office running about 120/80. No CP/ischemic type pain or SOB. Trying to remember to take Protonix BID as prescribed by Dr. Oneida Alar. Some flare up depending on foods. C/o cough x 3 weeks. Producing pale yellow sputum. No fever, sore throat. Ethmoid sinus area headache. Occasional ear pain. Smokes 1 ppd. Slight wheeze mainly in am. Also c/o urinary frequency that started last week. Mid lower abdominal discomfort. No mid back pain or flank pain. No dysuria. No vaginal discharge. Same partner. No history of recent UTI.   Objective:   BP 128/88 mmHg  Ht 5\' 4"  (1.626 m)  Wt 169 lb 2 oz (76.715 kg)  BMI 29.02 kg/m2 NAD. Alert, oriented. TMs clear effusion. Pharynx mild erythema with PND noted. Neck supple with mild anterior adenopathy. Lungs clear. Heart RRR. No CVA or flank tenderness. Abdomen soft, non distended with mild epigastric area tenderness and mild suprapubic area tenderness. Lower extremities no edema.  Results for orders placed or performed in visit on 11/20/14  POCT urinalysis dipstick  Result Value Ref Range   Color, UA Yellow    Clarity, UA Clear    Glucose, UA     Bilirubin, UA     Ketones, UA     Spec Grav, UA 1.015    Blood, UA Negative    pH, UA 5.0    Protein, UA     Urobilinogen, UA     Nitrite, UA     Leukocytes, UA small (1+) (A) Negative   Unable to perform microscopic exam because centrifuge is broken.  Assessment:  Problem List Items Addressed This Visit      Cardiovascular and Mediastinum   Essential hypertension, benign - Primary   Relevant Medications   losartan (COZAAR) 100 MG tablet     Digestive   GERD (gastroesophageal reflux disease)   Relevant Medications   pantoprazole (PROTONIX) 40 MG tablet    Other Visit Diagnoses    Urinary frequency        Relevant Orders    POCT urinalysis dipstick (Completed)    Acute ethmoidal sinusitis, recurrence not specified        Relevant Medications    levofloxacin (LEVAQUIN) 500 MG tablet      Plan:  Meds ordered this encounter  Medications  . pantoprazole (PROTONIX) 40 MG tablet    Sig: Take 1 tablet (40 mg total) by mouth 2 (two) times daily before a meal.    Dispense:  180 tablet    Refill:  1    Order Specific Question:  Supervising Provider    Answer:  Mikey Kirschner [[id number]]  . losartan (COZAAR) 100 MG tablet    Sig: Take 1 tablet (100 mg total) by mouth daily.    Dispense:  90 tablet    Refill:  1    Order Specific Question:  Supervising Provider    Answer:  Mikey Kirschner [[id number]]  . levofloxacin (LEVAQUIN) 500 MG tablet    Sig: Take 1 tablet (500 mg total) by mouth daily.    Dispense:  10 tablet    Refill:  0    Order Specific Question:  Supervising Provider    Answer:  Mikey Kirschner [[id number]]   Call back in 72 hours if no improvement in urinary symptoms. Take Protonix BID as previously directed. Recheck with Dr. Oneida Alar if persists.  OTC meds as directed for cough and congestion.  Return in about 6 months (around 05/21/2015) for recheck.

## 2014-12-18 ENCOUNTER — Ambulatory Visit (INDEPENDENT_AMBULATORY_CARE_PROVIDER_SITE_OTHER): Payer: 59 | Admitting: Family Medicine

## 2014-12-18 ENCOUNTER — Encounter: Payer: Self-pay | Admitting: Family Medicine

## 2014-12-18 VITALS — BP 138/86 | Ht 64.0 in | Wt 173.4 lb

## 2014-12-18 DIAGNOSIS — M545 Low back pain, unspecified: Secondary | ICD-10-CM

## 2014-12-18 MED ORDER — PREDNISONE 20 MG PO TABS: ORAL_TABLET | ORAL | Status: DC

## 2014-12-18 MED ORDER — HYDROCODONE-ACETAMINOPHEN 10-325 MG PO TABS: 1.0000 | ORAL_TABLET | ORAL | Status: DC | PRN

## 2014-12-18 NOTE — Progress Notes (Signed)
   Subjective:    Patient ID: Sandra Trujillo, female    DOB: 11/11/1960, 54 y.o.   MRN: [id number]  Back Pain This is a new problem. The current episode started in the past 7 days. The problem has been gradually worsening since onset. The pain is present in the lumbar spine and gluteal. The quality of the pain is described as aching. The pain radiates to the left thigh. The pain is at a severity of 10/10. The pain is severe. The symptoms are aggravated by twisting. Stiffness is present all day. Associated symptoms include abdominal pain, paresthesias and tingling. She has tried nothing for the symptoms. The treatment provided no relief.    Patient arrives with c/o back pain since last Sat. Patient reports no know injury. Patient does relate some discomfort into the left buttock region a little bit of tingling in that area but denies any leg weakness is never had this problem for Review of Systems  Gastrointestinal: Positive for abdominal pain.  Musculoskeletal: Positive for back pain.  Neurological: Positive for tingling and paresthesias.       Objective:   Physical Exam  Low back tenderness subjective discomfort in the lower back negative straight leg raise subjective discomfort into the left buttock      Assessment & Plan:  Lumbar strain should gradually get better no need for any x-rays or MRI currently exercises were shown if not improving over the next few weeks to notify us pain medicine when asked necessary cautioned drowsiness stretches shown

## 2015-01-31 ENCOUNTER — Other Ambulatory Visit: Payer: Self-pay | Admitting: Nurse Practitioner

## 2015-03-05 DIAGNOSIS — M62830 Muscle spasm of back: Secondary | ICD-10-CM | POA: Diagnosis not present

## 2015-03-05 DIAGNOSIS — R51 Headache: Secondary | ICD-10-CM | POA: Diagnosis not present

## 2015-03-05 DIAGNOSIS — M5412 Radiculopathy, cervical region: Secondary | ICD-10-CM | POA: Diagnosis not present

## 2015-03-05 DIAGNOSIS — M9901 Segmental and somatic dysfunction of cervical region: Secondary | ICD-10-CM | POA: Diagnosis not present

## 2015-03-06 DIAGNOSIS — M5412 Radiculopathy, cervical region: Secondary | ICD-10-CM | POA: Diagnosis not present

## 2015-03-06 DIAGNOSIS — M62830 Muscle spasm of back: Secondary | ICD-10-CM | POA: Diagnosis not present

## 2015-03-06 DIAGNOSIS — R51 Headache: Secondary | ICD-10-CM | POA: Diagnosis not present

## 2015-03-06 DIAGNOSIS — M9901 Segmental and somatic dysfunction of cervical region: Secondary | ICD-10-CM | POA: Diagnosis not present

## 2015-03-07 DIAGNOSIS — M9901 Segmental and somatic dysfunction of cervical region: Secondary | ICD-10-CM | POA: Diagnosis not present

## 2015-03-07 DIAGNOSIS — R51 Headache: Secondary | ICD-10-CM | POA: Diagnosis not present

## 2015-03-07 DIAGNOSIS — M62830 Muscle spasm of back: Secondary | ICD-10-CM | POA: Diagnosis not present

## 2015-03-07 DIAGNOSIS — M5412 Radiculopathy, cervical region: Secondary | ICD-10-CM | POA: Diagnosis not present

## 2015-03-08 DIAGNOSIS — M5412 Radiculopathy, cervical region: Secondary | ICD-10-CM | POA: Diagnosis not present

## 2015-03-08 DIAGNOSIS — M9901 Segmental and somatic dysfunction of cervical region: Secondary | ICD-10-CM | POA: Diagnosis not present

## 2015-03-08 DIAGNOSIS — M62830 Muscle spasm of back: Secondary | ICD-10-CM | POA: Diagnosis not present

## 2015-03-08 DIAGNOSIS — R51 Headache: Secondary | ICD-10-CM | POA: Diagnosis not present

## 2015-03-10 DIAGNOSIS — R51 Headache: Secondary | ICD-10-CM | POA: Diagnosis not present

## 2015-03-10 DIAGNOSIS — M9901 Segmental and somatic dysfunction of cervical region: Secondary | ICD-10-CM | POA: Diagnosis not present

## 2015-03-10 DIAGNOSIS — M5412 Radiculopathy, cervical region: Secondary | ICD-10-CM | POA: Diagnosis not present

## 2015-03-10 DIAGNOSIS — M62830 Muscle spasm of back: Secondary | ICD-10-CM | POA: Diagnosis not present

## 2015-03-12 DIAGNOSIS — R51 Headache: Secondary | ICD-10-CM | POA: Diagnosis not present

## 2015-03-12 DIAGNOSIS — M62830 Muscle spasm of back: Secondary | ICD-10-CM | POA: Diagnosis not present

## 2015-03-12 DIAGNOSIS — M9901 Segmental and somatic dysfunction of cervical region: Secondary | ICD-10-CM | POA: Diagnosis not present

## 2015-03-12 DIAGNOSIS — M5412 Radiculopathy, cervical region: Secondary | ICD-10-CM | POA: Diagnosis not present

## 2015-03-14 DIAGNOSIS — M5412 Radiculopathy, cervical region: Secondary | ICD-10-CM | POA: Diagnosis not present

## 2015-03-14 DIAGNOSIS — M62830 Muscle spasm of back: Secondary | ICD-10-CM | POA: Diagnosis not present

## 2015-03-14 DIAGNOSIS — M9901 Segmental and somatic dysfunction of cervical region: Secondary | ICD-10-CM | POA: Diagnosis not present

## 2015-03-14 DIAGNOSIS — R51 Headache: Secondary | ICD-10-CM | POA: Diagnosis not present

## 2015-03-15 DIAGNOSIS — M62830 Muscle spasm of back: Secondary | ICD-10-CM | POA: Diagnosis not present

## 2015-03-15 DIAGNOSIS — M5412 Radiculopathy, cervical region: Secondary | ICD-10-CM | POA: Diagnosis not present

## 2015-03-15 DIAGNOSIS — R51 Headache: Secondary | ICD-10-CM | POA: Diagnosis not present

## 2015-03-15 DIAGNOSIS — M9901 Segmental and somatic dysfunction of cervical region: Secondary | ICD-10-CM | POA: Diagnosis not present

## 2015-03-19 ENCOUNTER — Ambulatory Visit (INDEPENDENT_AMBULATORY_CARE_PROVIDER_SITE_OTHER): Payer: 59 | Admitting: Gastroenterology

## 2015-03-19 ENCOUNTER — Encounter: Payer: Self-pay | Admitting: Gastroenterology

## 2015-03-19 ENCOUNTER — Other Ambulatory Visit: Payer: Self-pay

## 2015-03-19 VITALS — BP 160/100 | HR 89 | Temp 97.6°F | Ht 64.0 in | Wt 176.8 lb

## 2015-03-19 DIAGNOSIS — R1310 Dysphagia, unspecified: Secondary | ICD-10-CM

## 2015-03-19 DIAGNOSIS — R1013 Epigastric pain: Secondary | ICD-10-CM | POA: Diagnosis not present

## 2015-03-19 DIAGNOSIS — M9901 Segmental and somatic dysfunction of cervical region: Secondary | ICD-10-CM | POA: Diagnosis not present

## 2015-03-19 DIAGNOSIS — R1319 Other dysphagia: Secondary | ICD-10-CM | POA: Insufficient documentation

## 2015-03-19 DIAGNOSIS — M5412 Radiculopathy, cervical region: Secondary | ICD-10-CM | POA: Diagnosis not present

## 2015-03-19 DIAGNOSIS — K279 Peptic ulcer, site unspecified, unspecified as acute or chronic, without hemorrhage or perforation: Secondary | ICD-10-CM

## 2015-03-19 DIAGNOSIS — K219 Gastro-esophageal reflux disease without esophagitis: Secondary | ICD-10-CM | POA: Diagnosis not present

## 2015-03-19 DIAGNOSIS — R51 Headache: Secondary | ICD-10-CM | POA: Diagnosis not present

## 2015-03-19 DIAGNOSIS — R1314 Dysphagia, pharyngoesophageal phase: Secondary | ICD-10-CM | POA: Diagnosis not present

## 2015-03-19 DIAGNOSIS — M62830 Muscle spasm of back: Secondary | ICD-10-CM | POA: Diagnosis not present

## 2015-03-19 NOTE — Patient Instructions (Signed)
1. Upper endoscopy with Dr. Oneida Alar. See separate instructions.  2. Continue pantoprazole twice daily. 3. Avoid NSAIDS.

## 2015-03-19 NOTE — Progress Notes (Signed)
CC'D TO PCP °

## 2015-03-19 NOTE — Assessment & Plan Note (Signed)
55 year old female with history of GERD, Schatzki ring requiring dilation back in July, peptic ulcer disease in the setting of aspirin and ibuprofen use who presents for follow-up. Her follow-up was delayed by a back injury. She is overdue for surveillance EGD to verify ulcer healing. Overall she feels better from GI standpoint but requires twice a day PPI to control her heartburn. Continues to have some vague esophageal dysphagia. Continues to have some mild epigastric discomfort, improves with meals. Recently went back on ibuprofen for her back injury, quit 4 weeks ago. Took ibuprofen intermittently but not daily.   Recommend EGD plus or minus esophageal dilation with Dr. Oneida Alar.  I have discussed the risks, alternatives, benefits with regards to but not limited to the risk of reaction to medication, bleeding, infection, perforation and the patient is agreeable to proceed. Written consent to be obtained.  Continue pantoprazole BID. Avoid NSAIDs.

## 2015-03-19 NOTE — Progress Notes (Signed)
Primary Care Physician: Sallee Lange, MD  Primary Gastroenterologist:  Barney Drain, MD   Chief Complaint  Patient presents with  . Follow-up    doing well    HPI: Sandra Trujillo is a 55 y.o. female here for follow-up. She underwent a EGD and colonoscopy with Dr. Oneida Alar back in July 2016. 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 Esophageal and gastric biopsies benign. No H pylori.  Swallowing better but not 100%. Overall heartburn improved but she does require second dose of protonic exertion develops nocturnal symptoms. Denies nausea or vomiting. Continues to have some epigastric pain, improves with meals. Injured her back in October. Took some ibuprofen around that time. None in the past one month. Now seeing a chiropractor.. Needs second dose of protonix. Back injury in 11/2014. Ibuprofen again, none in the past month. Now seeing chiropractor. BMs regular on Colace.   Current Outpatient Prescriptions  Medication Sig Dispense Refill  . Cholecalciferol (VITAMIN D) 2000 UNITS CAPS Take 1 capsule by mouth daily.    Marland Kitchen docusate sodium (COLACE) 100 MG capsule Take 100 mg by mouth daily.    . fluticasone (FLONASE) 50 MCG/ACT nasal spray PLACE 2 SPRAYS INTO BOTH NOSTRILS DAILY. 48 g 1  . hydrochlorothiazide (HYDRODIURIL) 25 MG tablet Take 1 tablet (25 mg total) by mouth daily. 30 tablet 5  . losartan (COZAAR) 100 MG tablet Take 1 tablet (100 mg total) by mouth daily. 90 tablet 1  . pantoprazole (PROTONIX) 40 MG tablet Take 1 tablet (40 mg total) by mouth 2 (two) times daily before a meal. 180 tablet 1   No current facility-administered medications for this visit.    Allergies as of 03/19/2015 - Review Complete 03/19/2015  Allergen Reaction Noted  . Sulfa antibiotics Other (See Comments) 12/16/2012  .  Lisinopril Cough 05/22/2014   Past Medical History  Diagnosis Date  . Hypertension   . Hyperlipidemia   . GERD (gastroesophageal reflux disease)    Past Surgical History  Procedure Laterality Date  . Colonoscopy with esophagogastroduodenoscopy (egd)  12/2002    [id number] HH/mildchanges of reflux/otherwise normal. normal colonoscopy and terminal ileoscopy.  . Cervical discectomy    . Colonoscopy N/A 09/15/2014    SLF: 1. submucosal lesion in the hepatic flexure likely a lipoma 2 . mild diverticulosis in teh sigmoid colon and descending colon 3. the left colon is redundant  . Esophagogastroduodenoscopy N/A 09/15/2014    SLF: 1. Schatzki ring at the gastroesophagel junction 2. moderate erosive gastritis/duoentitit and three small gastric ulcers due to ASA/ibuprofen  . Savory dilation N/A 09/15/2014    Procedure: SAVORY DILATION;  Surgeon: Danie Binder, MD;  Location: [facility];  Service: Endoscopy;  Laterality: N/A;   Family History  Problem Relation Age of Onset  . Hypertension Father   . Heart failure Mother   . Stroke Mother   . Colon cancer Neg Hx   . Inflammatory bowel disease Neg Hx   . Liver disease Neg Hx    Social History   Social History  . Marital Status: Married    Spouse Name: N/A  . Number of Children: 1  . Years of Education: N/A   Occupational History  . [employer]    Social History Main Topics  . Smoking status: Current Every Day Smoker -- 1.00 packs/day for 25 years    Types:  Cigarettes  . Smokeless tobacco: None  . Alcohol Use: No  . Drug Use: No  . Sexual Activity: Not Asked   Other Topics Concern  . None   Social History Narrative    ROS:  General: Negative for anorexia, weight loss, fever, chills, fatigue, weakness. ENT: Negative for hoarseness,  nasal congestion. See history of present illness CV: Negative for chest pain, angina, palpitations, dyspnea on exertion, peripheral edema.  Respiratory: Negative for dyspnea at rest, dyspnea on  exertion, cough, sputum, wheezing.  GI: See history of present illness. GU:  Negative for dysuria, hematuria, urinary incontinence, urinary frequency, nocturnal urination.  Endo: Negative for unusual weight change.    Physical Examination:   BP 165/106 mmHg  Pulse 89  Temp(Src) 97.6 F (36.4 C) (Oral)  Ht 5\' 4"  (1.626 m)  Wt 176 lb 12.8 oz (80.196 kg)  BMI 30.33 kg/m2  General: Well-nourished, well-developed in no acute distress.  Eyes: No icterus. Mouth: Oropharyngeal mucosa moist and pink , no lesions erythema or exudate. Lungs: Clear to auscultation bilaterally.  Heart: Regular rate and rhythm, no murmurs rubs or gallops.  Abdomen: Bowel sounds are normal, mild epigastric tenderness, nondistended, no hepatosplenomegaly or masses, no abdominal bruits or hernia , no rebound or guarding.   Extremities: No lower extremity edema. No clubbing or deformities. Neuro: Alert and oriented x 4   Skin: Warm and dry, no jaundice.   Psych: Alert and cooperative, normal mood and affect.    Imaging Studies: No results found.

## 2015-03-21 DIAGNOSIS — M62830 Muscle spasm of back: Secondary | ICD-10-CM | POA: Diagnosis not present

## 2015-03-21 DIAGNOSIS — M5412 Radiculopathy, cervical region: Secondary | ICD-10-CM | POA: Diagnosis not present

## 2015-03-21 DIAGNOSIS — R51 Headache: Secondary | ICD-10-CM | POA: Diagnosis not present

## 2015-03-21 DIAGNOSIS — M9901 Segmental and somatic dysfunction of cervical region: Secondary | ICD-10-CM | POA: Diagnosis not present

## 2015-03-23 DIAGNOSIS — R51 Headache: Secondary | ICD-10-CM | POA: Diagnosis not present

## 2015-03-23 DIAGNOSIS — M5412 Radiculopathy, cervical region: Secondary | ICD-10-CM | POA: Diagnosis not present

## 2015-03-23 DIAGNOSIS — M62830 Muscle spasm of back: Secondary | ICD-10-CM | POA: Diagnosis not present

## 2015-03-23 DIAGNOSIS — M9901 Segmental and somatic dysfunction of cervical region: Secondary | ICD-10-CM | POA: Diagnosis not present

## 2015-03-26 DIAGNOSIS — M5412 Radiculopathy, cervical region: Secondary | ICD-10-CM | POA: Diagnosis not present

## 2015-03-26 DIAGNOSIS — R51 Headache: Secondary | ICD-10-CM | POA: Diagnosis not present

## 2015-03-26 DIAGNOSIS — M9901 Segmental and somatic dysfunction of cervical region: Secondary | ICD-10-CM | POA: Diagnosis not present

## 2015-03-26 DIAGNOSIS — M62830 Muscle spasm of back: Secondary | ICD-10-CM | POA: Diagnosis not present

## 2015-03-28 DIAGNOSIS — R51 Headache: Secondary | ICD-10-CM | POA: Diagnosis not present

## 2015-03-28 DIAGNOSIS — M5412 Radiculopathy, cervical region: Secondary | ICD-10-CM | POA: Diagnosis not present

## 2015-03-28 DIAGNOSIS — M9901 Segmental and somatic dysfunction of cervical region: Secondary | ICD-10-CM | POA: Diagnosis not present

## 2015-03-28 DIAGNOSIS — M62830 Muscle spasm of back: Secondary | ICD-10-CM | POA: Diagnosis not present

## 2015-03-29 DIAGNOSIS — R51 Headache: Secondary | ICD-10-CM | POA: Diagnosis not present

## 2015-03-29 DIAGNOSIS — M5412 Radiculopathy, cervical region: Secondary | ICD-10-CM | POA: Diagnosis not present

## 2015-03-29 DIAGNOSIS — M62830 Muscle spasm of back: Secondary | ICD-10-CM | POA: Diagnosis not present

## 2015-03-29 DIAGNOSIS — M9901 Segmental and somatic dysfunction of cervical region: Secondary | ICD-10-CM | POA: Diagnosis not present

## 2015-03-30 ENCOUNTER — Other Ambulatory Visit: Payer: Self-pay | Admitting: Nurse Practitioner

## 2015-04-02 ENCOUNTER — Encounter (HOSPITAL_COMMUNITY)
Admission: RE | Disposition: A | Discharge status: Home / Self Care | Payer: Self-pay | Source: Ambulatory Visit | Attending: Gastroenterology

## 2015-04-02 ENCOUNTER — Encounter (HOSPITAL_COMMUNITY): Payer: Self-pay | Admitting: *Deleted

## 2015-04-02 ENCOUNTER — Ambulatory Visit (HOSPITAL_COMMUNITY)
Admission: RE | Admit: 2015-04-02 | Discharge: 2015-04-02 | Disposition: A | Discharge status: Home / Self Care | Payer: 59 | Source: Ambulatory Visit | Attending: Gastroenterology | Admitting: Gastroenterology

## 2015-04-02 DIAGNOSIS — Z79899 Other long term (current) drug therapy: Secondary | ICD-10-CM | POA: Insufficient documentation

## 2015-04-02 DIAGNOSIS — R1310 Dysphagia, unspecified: Secondary | ICD-10-CM | POA: Diagnosis not present

## 2015-04-02 DIAGNOSIS — K222 Esophageal obstruction: Secondary | ICD-10-CM | POA: Diagnosis not present

## 2015-04-02 DIAGNOSIS — K2970 Gastritis, unspecified, without bleeding: Secondary | ICD-10-CM

## 2015-04-02 DIAGNOSIS — K259 Gastric ulcer, unspecified as acute or chronic, without hemorrhage or perforation: Secondary | ICD-10-CM

## 2015-04-02 DIAGNOSIS — E785 Hyperlipidemia, unspecified: Secondary | ICD-10-CM | POA: Insufficient documentation

## 2015-04-02 DIAGNOSIS — I10 Essential (primary) hypertension: Secondary | ICD-10-CM | POA: Insufficient documentation

## 2015-04-02 DIAGNOSIS — K279 Peptic ulcer, site unspecified, unspecified as acute or chronic, without hemorrhage or perforation: Secondary | ICD-10-CM

## 2015-04-02 DIAGNOSIS — Z7951 Long term (current) use of inhaled steroids: Secondary | ICD-10-CM | POA: Diagnosis not present

## 2015-04-02 DIAGNOSIS — R1013 Epigastric pain: Secondary | ICD-10-CM

## 2015-04-02 DIAGNOSIS — K219 Gastro-esophageal reflux disease without esophagitis: Secondary | ICD-10-CM | POA: Diagnosis not present

## 2015-04-02 DIAGNOSIS — F17210 Nicotine dependence, cigarettes, uncomplicated: Secondary | ICD-10-CM | POA: Insufficient documentation

## 2015-04-02 DIAGNOSIS — K2960 Other gastritis without bleeding: Secondary | ICD-10-CM | POA: Insufficient documentation

## 2015-04-02 HISTORY — PX: ESOPHAGOGASTRODUODENOSCOPY: SHX5428

## 2015-04-02 HISTORY — PX: ESOPHAGEAL DILATION: SHX303

## 2015-04-02 SURGERY — EGD (ESOPHAGOGASTRODUODENOSCOPY)
Anesthesia: Moderate Sedation

## 2015-04-02 MED ORDER — MIDAZOLAM HCL 5 MG/5ML IJ SOLN
INTRAMUSCULAR | Status: AC
  Filled 2015-04-02: qty 10

## 2015-04-02 MED ORDER — MEPERIDINE HCL 100 MG/ML IJ SOLN
INTRAMUSCULAR | Status: DC | PRN
  Administered 2015-04-02: 25 mg via INTRAVENOUS
  Administered 2015-04-02: 50 mg via INTRAVENOUS
  Administered 2015-04-02: 25 mg via INTRAVENOUS

## 2015-04-02 MED ORDER — LIDOCAINE VISCOUS 2 % MT SOLN
OROMUCOSAL | Status: DC | PRN
Start: 2015-04-02 — End: 2015-04-02
  Administered 2015-04-02: 3 mL via OROMUCOSAL

## 2015-04-02 MED ORDER — MEPERIDINE HCL 100 MG/ML IJ SOLN
INTRAMUSCULAR | Status: AC
  Filled 2015-04-02: qty 2

## 2015-04-02 MED ORDER — MIDAZOLAM HCL 5 MG/5ML IJ SOLN
INTRAMUSCULAR | Status: DC | PRN
  Administered 2015-04-02: 1 mg via INTRAVENOUS
  Administered 2015-04-02 (×2): 2 mg via INTRAVENOUS

## 2015-04-02 MED ORDER — MINERAL OIL PO OIL
TOPICAL_OIL | ORAL | Status: AC
  Filled 2015-04-02: qty 30

## 2015-04-02 MED ORDER — LIDOCAINE VISCOUS 2 % MT SOLN
OROMUCOSAL | Status: AC
  Filled 2015-04-02: qty 15

## 2015-04-02 MED ORDER — STERILE WATER FOR IRRIGATION IR SOLN
Status: DC | PRN
  Administered 2015-04-02: 13:00:00

## 2015-04-02 MED ORDER — HYDROCODONE-ACETAMINOPHEN 5-325 MG PO TABS: ORAL_TABLET | ORAL | Status: DC

## 2015-04-02 MED ORDER — SODIUM CHLORIDE 0.9 % IV SOLN
INTRAVENOUS | Status: DC
  Administered 2015-04-02: 1000 mL via INTRAVENOUS

## 2015-04-02 NOTE — Discharge Instructions (Signed)
YOUR ULCERS HAVE HEALED. I dilated your esophagus. I DID NOT SEE A DEFINITE NARROWING IN YOUR esophagus. You have MILD gastritis.    USE CHLORASEPTIC SPRAY OR LOZENGES AS NEEDED FOR THROAT PAIN.  I GAVE YOU A PRESCRIPTION FOR VICODIN IF NEEDED FOR THROAT PAIN.  AVOID REFLUX TRIGGERS. SEE INFO BELOW.  CONTINUE PROTONIX. TAKE 30 MINUTES PRIOR TO MEALS TWICE DAILY.  PLEASE CALL IN 2 MOS IF YOUR SWALLOWING DIFFICULTY CONTINUES.  FOLLOW UP IN 6 MOS. UPPER ENDOSCOPY AFTER CARE Read the instructions outlined below and refer to this sheet in the next week. These discharge instructions provide you with general information on caring for yourself after you leave the hospital. While your treatment has been planned according to the most current medical practices available, unavoidable complications occasionally occur. If you have any problems or questions after discharge, call DR. Keziah Drotar, [phone].  ACTIVITY  You may resume your regular activity, but move at a slower pace for the next 24 hours.   Take frequent rest periods for the next 24 hours.   Walking will help get rid of the air and reduce the bloated feeling in your belly (abdomen).   No driving for 24 hours (because of the medicine (anesthesia) used during the test).   You may shower.   Do not sign any important legal documents or operate any machinery for 24 hours (because of the anesthesia used during the test).    NUTRITION  Drink plenty of fluids.   You may resume your normal diet as instructed by your doctor.   Begin with a light meal and progress to your normal diet. Heavy or fried foods are harder to digest and may make you feel sick to your stomach (nauseated).   Avoid alcoholic beverages for 24 hours or as instructed.    MEDICATIONS  You may resume your normal medications.   WHAT YOU CAN EXPECT TODAY  Some feelings of bloating in the abdomen.   Passage of more gas than usual.    IF YOU HAD A BIOPSY TAKEN  DURING THE UPPER ENDOSCOPY:  Eat a soft diet IF YOU HAVE NAUSEA, BLOATING, ABDOMINAL PAIN, OR VOMITING.    FINDING OUT THE RESULTS OF YOUR TEST Not all test results are available during your visit. DR. Oneida Alar WILL CALL YOU WITHIN 7 DAYS OF YOUR PROCEDUE WITH YOUR RESULTS. Do not assume everything is normal if you have not heard from DR. Tyneka Scafidi IN ONE WEEK, CALL HER OFFICE AT [phone].  SEEK IMMEDIATE MEDICAL ATTENTION AND CALL THE OFFICE: [phone] IF:  You have more than a spotting of blood in your stool.   Your belly is swollen (abdominal distention).   You are nauseated or vomiting.   You have a temperature over 101F.   You have abdominal pain or discomfort that is severe or gets worse throughout the day.  Gastritis  Gastritis is an inflammation (the body's way of reacting to injury and/or infection) of the stomach. It is often caused by viral or bacterial (germ) infections. It can also be caused BY ASPIRIN, BC/GOODY POWDER'S, (IBUPROFEN) MOTRIN, OR ALEVE (NAPROXEN), chemicals (including alcohol), SPICY FOODS, and medications. This illness may be associated with generalized malaise (feeling tired, not well), UPPER ABDOMINAL STOMACH cramps, and fever. One common bacterial cause of gastritis is an organism known as H. Pylori. This can be treated with antibiotics.   GERD SYMPTOMS Common symptoms of GERD are heartburn (burning in your chest). This is worse when lying down or bending over. It may  also cause belching and indigestion. Some of the things which make GERD worse are:  Increased weight pushes on stomach making acid rise more easily.   Smoking markedly increases acid production.   Alcohol decreases lower esophageal sphincter pressure (valve between stomach and esophagus), allowing acid from stomach into esophagus.   Late evening meals and going to bed with a full stomach increases pressure.   HOME CARE INSTRUCTIONS  Try to achieve and maintain an ideal body weight.    Avoid drinking alcoholic beverages.   DO NOT smokE.   Do not wear tight clothing around your chest or stomach.   Eat smaller meals and eat more frequently. This keeps your stomach from getting too full. Eat slowly.   Do not lie down for 2 or 3 hours after eating. Do not eat or drink anything 1 to 2 hours before going to bed.   Avoid caffeine beverages (colas, coffee, cocoa, tea), fatty foods, citrus fruits and all other foods and drinks that contain acid and that seem to increase the problems.   Avoid bending over, especially after eating OR STRAINING. Anything that increases the pressure in your belly increases the amount of acid that may be pushed up into your esophagus.    DYSPHAGIA DYSPHAGIA can be caused by stomach acid backing up into the tube that carries food from the mouth down to the stomach (lower esophagus).  TREATMENT There are a number of medicines used to treat DYSPHAGIA including: Antacids.  Proton-pump inhibitors: pantoprazole  HOME CARE INSTRUCTIONS Eat 2-3 hours before going to bed.  Try to reach and maintain a healthy weight.  Do not eat just a few very large meals. Instead, eat 4 TO 6 smaller meals throughout the day.  Try to identify foods and beverages that make your symptoms worse, and avoid these.  Avoid tight clothing.  Do not exercise right after eating.

## 2015-04-02 NOTE — Progress Notes (Signed)
REVIEWED-NO ADDITIONAL RECOMMENDATIONS. 

## 2015-04-02 NOTE — H&P (Signed)
Primary Care Physician:  Sallee Lange, MD Primary Gastroenterologist:  Dr. Oneida Alar  Pre-Procedure History & Physical: HPI:  Sandra Trujillo is a 55 y.o. female here for DYSPHAGIA.  Past Medical History  Diagnosis Date  . Hypertension   . Hyperlipidemia   . GERD (gastroesophageal reflux disease)     Past Surgical History  Procedure Laterality Date  . Colonoscopy with esophagogastroduodenoscopy (egd)  12/2002    [id number] HH/mildchanges of reflux/otherwise normal. normal colonoscopy and terminal ileoscopy.  . Cervical discectomy    . Colonoscopy N/A 09/15/2014    SLF: 1. submucosal lesion in the hepatic flexure likely a lipoma 2 . mild diverticulosis in teh sigmoid colon and descending colon 3. the left colon is redundant  . Esophagogastroduodenoscopy N/A 09/15/2014    SLF: 1. Schatzki ring at the gastroesophagel junction 2. moderate erosive gastritis/duoentitit and three small gastric ulcers due to ASA/ibuprofen  . Savory dilation N/A 09/15/2014    Procedure: SAVORY DILATION;  Surgeon: Danie Binder, MD;  Location: [facility];  Service: Endoscopy;  Laterality: N/A;    Prior to Admission medications   Medication Sig Start Date End Date Taking? Authorizing Provider  Cholecalciferol (VITAMIN D) 2000 UNITS CAPS Take 1 capsule by mouth daily.   Yes Historical Provider, MD  docusate sodium (COLACE) 100 MG capsule Take 100 mg by mouth daily.   Yes Historical Provider, MD  fluticasone (FLONASE) 50 MCG/ACT nasal spray PLACE 2 SPRAYS INTO BOTH NOSTRILS DAILY. 01/31/15  Yes Kathyrn Drown, MD  hydrochlorothiazide (HYDRODIURIL) 25 MG tablet TAKE 1 TABLET (25 MG TOTAL) BY MOUTH DAILY. 03/30/15  Yes Kathyrn Drown, MD  losartan (COZAAR) 100 MG tablet Take 1 tablet (100 mg total) by mouth daily. 11/20/14  Yes Nilda Simmer, NP  pantoprazole (PROTONIX) 40 MG tablet Take 1 tablet (40 mg total) by mouth 2 (two) times daily before a meal. 11/20/14  Yes Nilda Simmer, NP    Allergies as of  03/19/2015 - Review Complete 03/19/2015  Allergen Reaction Noted  . Sulfa antibiotics Other (See Comments) 12/16/2012  . Lisinopril Cough 05/22/2014    Family History  Problem Relation Age of Onset  . Hypertension Father   . Heart failure Mother   . Stroke Mother   . Colon cancer Neg Hx   . Inflammatory bowel disease Neg Hx   . Liver disease Neg Hx     Social History   Social History  . Marital Status: Married    Spouse Name: N/A  . Number of Children: 1  . Years of Education: N/A   Occupational History  . [employer]    Social History Main Topics  . Smoking status: Current Every Day Smoker -- 1.00 packs/day for 25 years    Types: Cigarettes  . Smokeless tobacco: Not on file  . Alcohol Use: No  . Drug Use: No  . Sexual Activity: Not on file   Other Topics Concern  . Not on file   Social History Narrative    Review of Systems: See HPI, otherwise negative ROS   Physical Exam: BP 136/89 mmHg  Pulse 75  Temp(Src) 98.8 F (37.1 C) (Oral)  Resp 22  Ht 5\' 4"  (1.626 m)  Wt 176 lb (79.833 kg)  BMI 30.20 kg/m2  SpO2 99% General:   Alert,  pleasant and cooperative in NAD Head:  Normocephalic and atraumatic. Neck:  Supple; Lungs:  Clear throughout to auscultation.    Heart:  Regular rate and rhythm. Abdomen:  Soft, nontender  and nondistended. Normal bowel sounds, without guarding, and without rebound.   Neurologic:  Alert and  oriented x4;  grossly normal neurologically.  Impression/Plan:     DYSPHAGIA  PLAN:  EGD/?DIL TODAY

## 2015-04-02 NOTE — Op Note (Signed)
[facility] [address] [place], [address]   ENDOSCOPY PROCEDURE REPORT  PATIENT: Sandra Trujillo, Sandra Trujillo  MR#: [id number] BIRTHDATE: [birth year] , 88  yrs. old GENDER: female  ENDOSCOPIST: Danie Binder, MD REFFERED [id number] Wolfgang Phoenix, M.D.  PROCEDURE DATE:  May 01, 2015 PROCEDURE:   EGD with biopsy and EGD with dilatation over guidewire   INDICATIONS:1.  dysphagia.   2.  PMHx: Gastric ulcers due to ibuprofen(JUL 2016). MEDICATIONS: Demerol 100 mg IV and Versed 5 mg IV MD INITIATED SEDATION: 1301 PROCEDURE COMPLETE: 1318 TOPICAL ANESTHETIC: Viscous Xylocaine  DESCRIPTION OF PROCEDURE:   After the risks benefits and alternatives of the procedure were thoroughly explained, informed consent was obtained.  The [id number]  endoscope was introduced through the mouth and advanced to the second portion of the duodenum. The instrument was slowly withdrawn as the mucosa was carefully examined.  Prior to withdrawal of the scope, the guidwire was placed.  The esophagus was dilated successfully.  The patient was recovered in endoscopy and discharged home in satisfactory condition. Estimated blood loss is zero unless otherwise noted in this procedure report.   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   STOMACH: Mild erosive gastritis (inflammation) was found in the gastric antrum and at the pylorus. Multiple biopsies were performed using cold forceps.   DUODENUM: The duodenal mucosa showed no abnormalities in the bulb and second portion of the duodenum.   Dilation was then performed at the proximal esophagus Dilator: Savary over guidewire Size(s): 14-17 MM Resistance: minimal Heme: none  COMPLICATIONS: There were no immediate complications.  ENDOSCOPIC IMPRESSION: 1.   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   MILD Erosive gastritis  RECOMMENDATIONS: USE CHLORASEPTIC SPRAY/LOZENGES PRN THROAT PAIN. PRESCRIPTION  FOR VICODIN IF NEEDED FOR THROAT PAIN. AVOID REFLUX TRIGGERS. PROTONIX 30 MINS PRIOR TO MEALS TWICE DAILY. CALL IN 2 MOS IF DYSPHAGIA CONTINUES. FOLLOW UP IN 6 MOS.    eSigned:  Danie Binder, MD 05/01/2015 8:03 PM   CPT CODES: ICD CODES:  The ICD and CPT codes recommended by this software are interpretations from the data that the clinical staff has captured with the software.  The verification of the translation of this report to the ICD and CPT codes and modifiers is the sole responsibility of the health care institution and practicing physician where this report was generated.  [employer] will not be held responsible for the validity of the ICD and CPT codes included on this report.  AMA assumes no liability for data contained or not contained herein. CPT is a Designer, television/film set of the [employer].

## 2015-04-03 ENCOUNTER — Encounter (HOSPITAL_COMMUNITY): Payer: Self-pay | Admitting: Gastroenterology

## 2015-05-21 ENCOUNTER — Encounter: Payer: Self-pay | Admitting: Nurse Practitioner

## 2015-05-21 ENCOUNTER — Ambulatory Visit (INDEPENDENT_AMBULATORY_CARE_PROVIDER_SITE_OTHER): Payer: 59 | Admitting: Nurse Practitioner

## 2015-05-21 VITALS — BP 142/96 | Temp 99.1°F | Ht 64.0 in | Wt 176.0 lb

## 2015-05-21 DIAGNOSIS — Z79899 Other long term (current) drug therapy: Secondary | ICD-10-CM

## 2015-05-21 DIAGNOSIS — K219 Gastro-esophageal reflux disease without esophagitis: Secondary | ICD-10-CM | POA: Diagnosis not present

## 2015-05-21 DIAGNOSIS — J329 Chronic sinusitis, unspecified: Secondary | ICD-10-CM

## 2015-05-21 DIAGNOSIS — I10 Essential (primary) hypertension: Secondary | ICD-10-CM | POA: Diagnosis not present

## 2015-05-21 DIAGNOSIS — E785 Hyperlipidemia, unspecified: Secondary | ICD-10-CM | POA: Diagnosis not present

## 2015-05-21 DIAGNOSIS — J310 Chronic rhinitis: Secondary | ICD-10-CM

## 2015-05-21 DIAGNOSIS — E559 Vitamin D deficiency, unspecified: Secondary | ICD-10-CM

## 2015-05-21 MED ORDER — HYDROCHLOROTHIAZIDE 25 MG PO TABS
ORAL_TABLET | ORAL | Status: DC
Start: 2015-05-21 — End: 2016-01-07

## 2015-05-21 MED ORDER — AZITHROMYCIN 250 MG PO TABS: ORAL_TABLET | ORAL | Status: DC

## 2015-05-21 MED ORDER — LOSARTAN POTASSIUM 100 MG PO TABS: 100.0000 mg | ORAL_TABLET | Freq: Every day | ORAL | Status: DC

## 2015-05-21 MED ORDER — PANTOPRAZOLE SODIUM 40 MG PO TBEC: 40.0000 mg | DELAYED_RELEASE_TABLET | Freq: Two times a day (BID) | ORAL | Status: DC

## 2015-05-22 ENCOUNTER — Encounter: Payer: Self-pay | Admitting: Nurse Practitioner

## 2015-05-22 DIAGNOSIS — I10 Essential (primary) hypertension: Secondary | ICD-10-CM | POA: Diagnosis not present

## 2015-05-22 DIAGNOSIS — Z79899 Other long term (current) drug therapy: Secondary | ICD-10-CM | POA: Diagnosis not present

## 2015-05-22 DIAGNOSIS — E559 Vitamin D deficiency, unspecified: Secondary | ICD-10-CM | POA: Diagnosis not present

## 2015-05-22 DIAGNOSIS — E785 Hyperlipidemia, unspecified: Secondary | ICD-10-CM | POA: Diagnosis not present

## 2015-05-22 NOTE — Progress Notes (Signed)
Subjective:  Presents for routine follow-up on her blood pressure. Compliant with medication. No chest pain/ischemic type pain or shortness of breath. No edema. Limited activity lately due to some back issues. Has been able to maintain her weight. Continues to take Protonix daily, takes it twice a day depending on what she eats. Twice a day dosing was done by Dr. Oneida Alar. Also complaints of congestion and cough that began 3 days ago. Has been taking decongestants. No fever. Cough worse at nighttime. Now producing light yellow sputum. Runny nose. No wheezing. Facial area headache. No sore throat or ear pain.  Objective:   BP 142/96 mmHg  Temp(Src) 99.1 F (37.3 C) (Oral)  Ht 5\' 4"  (1.626 m)  Wt 176 lb (79.833 kg)  BMI 30.20 kg/m2 NAD. Alert, oriented. TMs clear effusion, no erythema. Pharynx nonerythematous with PND noted. Neck supple with mild soft anterior adenopathy. Lungs clear. Heart regular rate rhythm. BP on recheck right arm sitting 142/90. Lower extremities no edema.  Assessment:  Problem List Items Addressed This Visit      Cardiovascular and Mediastinum   Essential hypertension, benign - Primary   Relevant Medications   losartan (COZAAR) 100 MG tablet   hydrochlorothiazide (HYDRODIURIL) 25 MG tablet   Other Relevant Orders   Basic metabolic panel     Digestive   GERD (gastroesophageal reflux disease)   Relevant Medications   pantoprazole (PROTONIX) 40 MG tablet     Other   Hyperlipidemia   Relevant Medications   losartan (COZAAR) 100 MG tablet   hydrochlorothiazide (HYDRODIURIL) 25 MG tablet   Other Relevant Orders   Lipid panel   Vitamin D deficiency   Relevant Orders   VITAMIN D 25 Hydroxy (Vit-D Deficiency, Fractures)    Other Visit Diagnoses    High risk medication use        Relevant Orders    Hepatic function panel    Rhinosinusitis        Relevant Medications    azithromycin (ZITHROMAX Z-PAK) 250 MG tablet        Plan:  Meds ordered this encounter   Medications  . azithromycin (ZITHROMAX Z-PAK) 250 MG tablet    Sig: Take 2 tablets (500 mg) on  Day 1,  followed by 1 tablet (250 mg) once daily on Days 2 through 5.    Dispense:  6 each    Refill:  0    Order Specific Question:  Supervising Provider    Answer:  Mikey Kirschner [[id number]]  . pantoprazole (PROTONIX) 40 MG tablet    Sig: Take 1 tablet (40 mg total) by mouth 2 (two) times daily before a meal.    Dispense:  180 tablet    Refill:  1    Order Specific Question:  Supervising Provider    Answer:  Mikey Kirschner [[id number]]  . losartan (COZAAR) 100 MG tablet    Sig: Take 1 tablet (100 mg total) by mouth daily.    Dispense:  90 tablet    Refill:  1    Order Specific Question:  Supervising Provider    Answer:  Mikey Kirschner [[id number]]  . hydrochlorothiazide (HYDRODIURIL) 25 MG tablet    Sig: TAKE 1 TABLET (25 MG TOTAL) BY MOUTH DAILY.    Dispense:  90 tablet    Refill:  1    Order Specific Question:  Supervising Provider    Answer:  Maggie Font   Encourage activity as tolerated. Avoid decongestants due to blood pressure. Recommend  that she take HCTZ daily. OTC meds as directed for congestion. Call back if worsens or persists. Gets regular gynecological physicals. Return in about 6 months (around 11/20/2015) for recheck.

## 2015-05-23 LAB — BASIC METABOLIC PANEL
BUN/Creatinine Ratio: 13 (ref 9–23)
BUN: 14 mg/dL (ref 6–24)
CHLORIDE: 100 mmol/L (ref 96–106)
CO2: 24 mmol/L (ref 18–29)
CREATININE: 1.09 mg/dL — AB (ref 0.57–1.00)
Calcium: 10.1 mg/dL (ref 8.7–10.2)
GFR calc Af Amer: 67 mL/min/{1.73_m2} (ref >59)
GFR calc non Af Amer: 59 mL/min/{1.73_m2} — ABNORMAL LOW (ref >59)
GLUCOSE: 83 mg/dL (ref 65–99)
Potassium: 4.2 mmol/L (ref 3.5–5.2)
SODIUM: 139 mmol/L (ref 134–144)

## 2015-05-23 LAB — VITAMIN D 25 HYDROXY (VIT D DEFICIENCY, FRACTURES): VIT D 25 HYDROXY: 30.8 ng/mL (ref 30.0–100.0)

## 2015-05-23 LAB — LIPID PANEL
CHOL/HDL RATIO: 4.7 ratio — AB (ref 0.0–4.4)
Cholesterol, Total: 188 mg/dL (ref 100–199)
HDL: 40 mg/dL (ref >39)
LDL Calculated: 123 mg/dL — ABNORMAL HIGH (ref 0–99)
Triglycerides: 123 mg/dL (ref 0–149)
VLDL CHOLESTEROL CAL: 25 mg/dL (ref 5–40)

## 2015-05-23 LAB — HEPATIC FUNCTION PANEL
ALT: 17 IU/L (ref 0–32)
AST: 15 IU/L (ref 0–40)
Albumin: 4.2 g/dL (ref 3.5–5.5)
Alkaline Phosphatase: 58 IU/L (ref 39–117)
Bilirubin Total: 0.4 mg/dL (ref 0.0–1.2)
Bilirubin, Direct: 0.08 mg/dL (ref 0.00–0.40)
TOTAL PROTEIN: 6.3 g/dL (ref 6.0–8.5)

## 2015-08-27 ENCOUNTER — Ambulatory Visit (INDEPENDENT_AMBULATORY_CARE_PROVIDER_SITE_OTHER): Payer: 59 | Admitting: Nurse Practitioner

## 2015-08-27 ENCOUNTER — Encounter: Payer: Self-pay | Admitting: Nurse Practitioner

## 2015-08-27 VITALS — BP 130/86 | Ht 64.0 in | Wt 175.2 lb

## 2015-08-27 DIAGNOSIS — M778 Other enthesopathies, not elsewhere classified: Secondary | ICD-10-CM

## 2015-08-27 DIAGNOSIS — M7711 Lateral epicondylitis, right elbow: Secondary | ICD-10-CM

## 2015-08-27 DIAGNOSIS — S86811A Strain of other muscle(s) and tendon(s) at lower leg level, right leg, initial encounter: Secondary | ICD-10-CM

## 2015-08-27 DIAGNOSIS — S86911A Strain of unspecified muscle(s) and tendon(s) at lower leg level, right leg, initial encounter: Secondary | ICD-10-CM

## 2015-08-27 MED ORDER — MELOXICAM 15 MG PO TABS: 15.0000 mg | ORAL_TABLET | Freq: Every day | ORAL | Status: DC

## 2015-08-27 NOTE — Patient Instructions (Signed)
Knee Pain Knee pain is a very common symptom and can have many causes. Knee pain often goes away when you follow your health care provider's instructions for relieving pain and discomfort at home. However, knee pain can develop into a condition that needs treatment. Some conditions may include:  Arthritis caused by wear and tear (osteoarthritis).  Arthritis caused by swelling and irritation (rheumatoid arthritis or gout).  A cyst or growth in your knee.  An infection in your knee joint.  An injury that will not heal.  Damage, swelling, or irritation of the tissues that support your knee (torn ligaments or tendinitis). If your knee pain continues, additional tests may be ordered to diagnose your condition. Tests may include X-rays or other imaging studies of your knee. You may also need to have fluid removed from your knee. Treatment for ongoing knee pain depends on the cause, but treatment may include:  Medicines to relieve pain or swelling.  Steroid injections in your knee.  Physical therapy.  Surgery. HOME CARE INSTRUCTIONS  Take medicines only as directed by your health care provider.  Rest your knee and keep it raised (elevated) while you are resting.  Do not do things that cause or worsen pain.  Avoid high-impact activities or exercises, such as running, jumping rope, or doing jumping jacks.  Apply ice to the knee area:  Put ice in a plastic bag.  Place a towel between your skin and the bag.  Leave the ice on for 20 minutes, 2-3 times a day.  Ask your health care provider if you should wear an elastic knee support.  Keep a pillow under your knee when you sleep.  Lose weight if you are overweight. Extra weight can put pressure on your knee.  Do not use any tobacco products, including cigarettes, chewing tobacco, or electronic cigarettes. If you need help quitting, ask your health care provider. Smoking may slow the healing of any bone and joint problems that you may  have. SEEK MEDICAL CARE IF:  Your knee pain continues, changes, or gets worse.  You have a fever along with knee pain.  Your knee buckles or locks up.  Your knee becomes more swollen. SEEK IMMEDIATE MEDICAL CARE IF:   Your knee joint feels hot to the touch.  You have chest pain or trouble breathing.   This information is not intended to replace advice given to you by your health care provider. Make sure you discuss any questions you have with your health care provider.   Document Released: 12/01/2006 Document Revised: 02/24/2014 Document Reviewed: 09/19/2013 Elsevier Interactive Patient Education 2016 [employer]. Tennis Elbow Tennis elbow (lateral epicondylitis) is inflammation of the outer tendons of your forearm close to your elbow. Your tendons attach your muscles to your bones. The outer tendons of your forearm are used to extend your wrist, and they attach on the outside part of your elbow. Tennis elbow is often found in people who play tennis, but anyone may get the condition from repeatedly extending the wrist or turning the forearm. CAUSES This condition is caused by repeatedly extending your wrist and using your hands. It can result from sports or work that requires repetitive forearm movements. Tennis elbow may also be caused by an injury. RISK FACTORS You have a higher risk of developing tennis elbow if you play tennis or another racquet sport. You also have a higher risk if you frequently use your hands for work. This condition is also more likely to develop in:  Musicians.  Carpenters, painters, and plumbers.  Cooks.  Cashiers.  People who work in [employer].  Architect workers.  Butchers.  People who use computers. SYMPTOMS Symptoms of this condition include:  Pain and tenderness in your forearm and the outer part of your elbow. You may only feel the pain when you use your arm, or you may feel it even when you are not using your arm.  A burning  feeling that runs from your elbow through your arm.  Weak grip in your hands. DIAGNOSIS  This condition may be diagnosed by medical history and physical exam. You may also have other tests, including:  X-rays.  MRI. TREATMENT Your health care provider will recommend lifestyle adjustments, such as resting and icing your arm. Treatment may also include:  Medicines for inflammation. This may include shots of cortisone if your pain continues.  Physical therapy. This may include massage or exercises.  An elbow brace. Surgery may eventually be recommended if your pain does not go away with treatment. HOME CARE INSTRUCTIONS Activity  Rest your elbow and wrist as directed by your health care provider. Try to avoid any activities that caused the problem until your health care provider says that you can do them again.  If a physical therapist teaches you exercises, do all of them as directed.  If you lift an object, lift it with your palm facing upward. This lowers the stress on your elbow. Lifestyle  If your tennis elbow is caused by sports, check your equipment and make sure that:  You are using it correctly.  It is the best fit for you.  If your tennis elbow is caused by work, take breaks frequently, if you are able. Talk with your manager about how to best perform tasks in a way that is safe.  If your tennis elbow is caused by computer use, talk with your manager about any changes that can be made to your work environment. General Instructions  If directed, apply ice to the painful area:  Put ice in a plastic bag.  Place a towel between your skin and the bag.  Leave the ice on for 20 minutes, 2-3 times per day.  Take medicines only as directed by your health care provider.  If you were given a brace, wear it as directed by your health care provider.  Keep all follow-up visits as directed by your health care provider. This is important. SEEK MEDICAL CARE IF:  Your pain  does not get better with treatment.  Your pain gets worse.  You have numbness or weakness in your forearm, hand, or fingers.   This information is not intended to replace advice given to you by your health care provider. Make sure you discuss any questions you have with your health care provider.   Document Released: 02/03/2005 Document Revised: 06/20/2014 Document Reviewed: 01/30/2014 Elsevier Interactive Patient Education [employer].

## 2015-08-28 ENCOUNTER — Encounter: Payer: Self-pay | Admitting: Nurse Practitioner

## 2015-08-28 NOTE — Progress Notes (Signed)
Subjective:  Presents for complaints of right wrist pain that began about a month ago. States she woke up with discomfort. No history of injury. Has been wearing a light wrist brace which has helped some. Worse with rotation of the arm. No numbness. Slight decrease in strength especially with lifting. Sometimes pain will radiate into the fingers. Also has noticed some tenderness around the lateral part of the right elbow. No fever. No erythema or warmth. Mild edema. Also complaints of off-and-on anterior medial knee pain on the right side for the past 2 years. Has gotten progressively worse. Now occurs every day. Can perform weightbearing but worse with walking. No specific history of injury. Worse with bending.  Objective:   BP 130/86 mmHg  Ht 5\' 4"  (1.626 m)  Wt 175 lb 4 oz (79.493 kg)  BMI 30.07 kg/m2 NAD. Alert, oriented. Right wrist mild edema noted dorsal aspect, no erythema or warmth. Normal ROM of the right wrist with minimal tenderness. Strong radial pulse. Hand strength 5+ bilateral. Phalen and Tinel's negative. Sensation grossly intact. Distinct tenderness noted along the lateral right epicondyle. Discomfort mainly noted with rotation of the lower arm. Right knee no edema noted. Minimal crepitus. No joint laxity. Mild tenderness with palpation of the anterior medial aspect of the knee.  Assessment: Epicondylitis, lateral (tennis elbow), right  Right wrist tendonitis  Knee strain, right, initial encounter  Plan:  Meds ordered this encounter  Medications  . meloxicam (MOBIC) 15 MG tablet    Sig: Take 1 tablet (15 mg total) by mouth daily.    Dispense:  30 tablet    Refill:  0    Order Specific Question:  Supervising Provider    Answer:  Mikey Kirschner [[id number]]   Take mobic with food. Patient has a history of GERD. DC med if any exacerbation. Continue wearing light wrist brace but also add arm band for epicondylitis. Ace wrap for right knee. Hold on further intervention at this  point. Ice/heat applications. Given copy of exercises for her elbow. Call back in 10-14 days if no improvement, sooner if worse. Reminded patient about preventive health physical.

## 2015-09-18 ENCOUNTER — Encounter: Payer: Self-pay | Admitting: Gastroenterology

## 2015-11-19 ENCOUNTER — Ambulatory Visit: Payer: 59 | Admitting: Nurse Practitioner

## 2016-01-07 ENCOUNTER — Encounter: Payer: Self-pay | Admitting: Nurse Practitioner

## 2016-01-07 ENCOUNTER — Ambulatory Visit (INDEPENDENT_AMBULATORY_CARE_PROVIDER_SITE_OTHER): Payer: 59 | Admitting: Nurse Practitioner

## 2016-01-07 VITALS — BP 124/88 | Temp 98.2°F | Ht 64.0 in | Wt 168.5 lb

## 2016-01-07 DIAGNOSIS — I10 Essential (primary) hypertension: Secondary | ICD-10-CM | POA: Diagnosis not present

## 2016-01-07 DIAGNOSIS — K219 Gastro-esophageal reflux disease without esophagitis: Secondary | ICD-10-CM

## 2016-01-07 DIAGNOSIS — J329 Chronic sinusitis, unspecified: Secondary | ICD-10-CM

## 2016-01-07 MED ORDER — AMOXICILLIN-POT CLAVULANATE 875-125 MG PO TABS: 1.0000 | ORAL_TABLET | Freq: Two times a day (BID) | ORAL | 0 refills | Status: DC

## 2016-01-07 MED ORDER — PANTOPRAZOLE SODIUM 40 MG PO TBEC: 40.0000 mg | DELAYED_RELEASE_TABLET | Freq: Two times a day (BID) | ORAL | 1 refills | Status: DC

## 2016-01-07 MED ORDER — HYDROCHLOROTHIAZIDE 25 MG PO TABS: ORAL_TABLET | ORAL | 1 refills | Status: DC

## 2016-01-07 MED ORDER — LOSARTAN POTASSIUM 100 MG PO TABS: 100.0000 mg | ORAL_TABLET | Freq: Every day | ORAL | 1 refills | Status: DC

## 2016-01-09 ENCOUNTER — Encounter: Payer: Self-pay | Admitting: Nurse Practitioner

## 2016-01-09 NOTE — Progress Notes (Signed)
Subjective:  Presents for routine follow-up. Compliant with medications. No chest pain/ischemic type pain or shortness of breath. No edema. Taking her Protonix once daily which overall controls her reflux symptoms, will increase to twice a day dosing as needed. Has been working on her weight. Exercising at least twice per week. Sugar and blood pressure readings have been good outside the office. Facial pressure began at the end of October. Now having a cough in the morning and at nighttime. No sore throat ear pain or fever.  Objective:   BP 124/88   Temp 98.2 F (36.8 C) (Oral)   Ht 5\' 4"  (1.626 m)   Wt 168 lb 8 oz (76.4 kg)   BMI 28.92 kg/m  NAD. Alert, oriented. TMs clear effusion, no erythema. Pharynx injected with green PND noted. Neck supple with mild soft anterior adenopathy. Lungs clear. Heart regular rate rhythm. Lower extremities no edema. Abdomen soft nondistended with minimal epigastric area tenderness.  Assessment:  Problem List Items Addressed This Visit      Cardiovascular and Mediastinum   Essential hypertension, benign - Primary   Relevant Medications   hydrochlorothiazide (HYDRODIURIL) 25 MG tablet   losartan (COZAAR) 100 MG tablet     Digestive   GERD (gastroesophageal reflux disease)   Relevant Medications   pantoprazole (PROTONIX) 40 MG tablet    Other Visit Diagnoses    Rhinosinusitis       Relevant Medications   amoxicillin-clavulanate (AUGMENTIN) 875-125 MG tablet       Plan:  Meds ordered this encounter  Medications  . amoxicillin-clavulanate (AUGMENTIN) 875-125 MG tablet    Sig: Take 1 tablet by mouth 2 (two) times daily.    Dispense:  20 tablet    Refill:  0    Order Specific Question:   Supervising Provider    Answer:   Mikey Kirschner [[id number]]  . hydrochlorothiazide (HYDRODIURIL) 25 MG tablet    Sig: TAKE 1 TABLET (25 MG TOTAL) BY MOUTH DAILY.    Dispense:  90 tablet    Refill:  1    Order Specific Question:   Supervising Provider    Answer:    Mikey Kirschner [[id number]]  . losartan (COZAAR) 100 MG tablet    Sig: Take 1 tablet (100 mg total) by mouth daily.    Dispense:  90 tablet    Refill:  1    Order Specific Question:   Supervising Provider    Answer:   Mikey Kirschner [[id number]]  . pantoprazole (PROTONIX) 40 MG tablet    Sig: Take 1 tablet (40 mg total) by mouth 2 (two) times daily before a meal.    Dispense:  180 tablet    Refill:  1    Order Specific Question:   Supervising Provider    Answer:   Mikey Kirschner [[id number]]     OTC meds as directed for congestion and cough. Call back if worsens or persists. Continue current regimen. Continue follow-up with GI specialist for reflux disease as needed. Return in about 6 months (around 07/06/2016) for recheck.

## 2016-01-14 DIAGNOSIS — Z01419 Encounter for gynecological examination (general) (routine) without abnormal findings: Secondary | ICD-10-CM | POA: Diagnosis not present

## 2016-01-14 DIAGNOSIS — Z1231 Encounter for screening mammogram for malignant neoplasm of breast: Secondary | ICD-10-CM | POA: Diagnosis not present

## 2016-01-14 DIAGNOSIS — Z6829 Body mass index (BMI) 29.0-29.9, adult: Secondary | ICD-10-CM | POA: Diagnosis not present

## 2016-01-14 DIAGNOSIS — Z1151 Encounter for screening for human papillomavirus (HPV): Secondary | ICD-10-CM | POA: Diagnosis not present

## 2016-05-27 ENCOUNTER — Other Ambulatory Visit: Payer: Self-pay | Admitting: Family Medicine

## 2016-07-04 ENCOUNTER — Ambulatory Visit (INDEPENDENT_AMBULATORY_CARE_PROVIDER_SITE_OTHER): Payer: 59 | Admitting: Nurse Practitioner

## 2016-07-04 ENCOUNTER — Encounter: Payer: Self-pay | Admitting: Nurse Practitioner

## 2016-07-04 VITALS — BP 132/82 | Ht 64.0 in | Wt 177.2 lb

## 2016-07-04 DIAGNOSIS — E559 Vitamin D deficiency, unspecified: Secondary | ICD-10-CM | POA: Diagnosis not present

## 2016-07-04 DIAGNOSIS — J300 Vasomotor rhinitis: Secondary | ICD-10-CM | POA: Diagnosis not present

## 2016-07-04 DIAGNOSIS — I10 Essential (primary) hypertension: Secondary | ICD-10-CM

## 2016-07-04 DIAGNOSIS — E785 Hyperlipidemia, unspecified: Secondary | ICD-10-CM | POA: Diagnosis not present

## 2016-07-04 MED ORDER — METHYLPREDNISOLONE ACETATE 40 MG/ML IJ SUSP
40.0000 mg | Freq: Once | INTRAMUSCULAR | Status: AC
  Administered 2016-07-04: 40 mg via INTRAMUSCULAR

## 2016-07-06 ENCOUNTER — Encounter: Payer: Self-pay | Admitting: Nurse Practitioner

## 2016-07-06 NOTE — Progress Notes (Signed)
Subjective:  Presents for recheck on HTN. Limited activity for the past month due to transportation issues. Plans to restart work outs. No CP/ischemic type pain or SOB. Off/on ethmoid sinus pressure for about 6 weeks. No cough, fever, sore throat or ear pain. Taking Flonase and antihistamine daily with minimal relief.    Objective:   BP 132/82   Ht 5\' 4"  (1.626 m)   Wt 177 lb 3.2 oz (80.4 kg)   BMI 30.42 kg/m  NAD. Alert, oriented. TMs mild clear effusion. Pharynx injected with clear PND noted. Neck supple with mild anterior adenopathy. Lungs clear. Heart RRR.   Assessment:   Problem List Items Addressed This Visit      Cardiovascular and Mediastinum   Essential hypertension, benign - Primary   Relevant Orders   Hepatic function panel   Basic metabolic panel     Other   Hyperlipidemia   Relevant Orders   Lipid panel   Vitamin D deficiency   Relevant Orders   VITAMIN D 25 Hydroxy (Vit-D Deficiency, Fractures)    Other Visit Diagnoses    Vasomotor rhinitis       Relevant Medications   methylPREDNISolone acetate (DEPO-MEDROL) injection 40 mg (Completed)       Plan:   Meds ordered this encounter  Medications  . methylPREDNISolone acetate (DEPO-MEDROL) injection 40 mg   Continue Flonase and antihistamine. Call back next week if no improvement  Encouraged weight loss and regular activity. Labs pending.  Return in about 6 months (around 01/04/2017) for recheck.

## 2016-07-07 DIAGNOSIS — E559 Vitamin D deficiency, unspecified: Secondary | ICD-10-CM | POA: Diagnosis not present

## 2016-07-07 DIAGNOSIS — E785 Hyperlipidemia, unspecified: Secondary | ICD-10-CM | POA: Diagnosis not present

## 2016-07-07 DIAGNOSIS — I10 Essential (primary) hypertension: Secondary | ICD-10-CM | POA: Diagnosis not present

## 2016-07-08 LAB — HEPATIC FUNCTION PANEL
ALT: 19 IU/L (ref 0–32)
AST: 11 IU/L (ref 0–40)
Albumin: 4 g/dL (ref 3.5–5.5)
Alkaline Phosphatase: 55 IU/L (ref 39–117)
BILIRUBIN TOTAL: 0.3 mg/dL (ref 0.0–1.2)
BILIRUBIN, DIRECT: 0.1 mg/dL (ref 0.00–0.40)
TOTAL PROTEIN: 6.3 g/dL (ref 6.0–8.5)

## 2016-07-08 LAB — BASIC METABOLIC PANEL
BUN / CREAT RATIO: 13 (ref 9–23)
BUN: 15 mg/dL (ref 6–24)
CALCIUM: 10 mg/dL (ref 8.7–10.2)
CO2: 24 mmol/L (ref 18–29)
CREATININE: 1.17 mg/dL — AB (ref 0.57–1.00)
Chloride: 103 mmol/L (ref 96–106)
GFR calc non Af Amer: 53 mL/min/{1.73_m2} — ABNORMAL LOW (ref >59)
GFR, EST AFRICAN AMERICAN: 61 mL/min/{1.73_m2} (ref >59)
GLUCOSE: 85 mg/dL (ref 65–99)
Potassium: 4.4 mmol/L (ref 3.5–5.2)
Sodium: 138 mmol/L (ref 134–144)

## 2016-07-08 LAB — LIPID PANEL
CHOL/HDL RATIO: 4.3 ratio (ref 0.0–4.4)
Cholesterol, Total: 196 mg/dL (ref 100–199)
HDL: 46 mg/dL (ref >39)
LDL Calculated: 134 mg/dL — ABNORMAL HIGH (ref 0–99)
Triglycerides: 82 mg/dL (ref 0–149)
VLDL Cholesterol Cal: 16 mg/dL (ref 5–40)

## 2016-07-08 LAB — VITAMIN D 25 HYDROXY (VIT D DEFICIENCY, FRACTURES): VIT D 25 HYDROXY: 29.1 ng/mL — AB (ref 30.0–100.0)

## 2016-12-12 ENCOUNTER — Other Ambulatory Visit: Payer: Self-pay | Admitting: Nurse Practitioner

## 2017-01-05 ENCOUNTER — Encounter: Payer: Self-pay | Admitting: Nurse Practitioner

## 2017-01-05 ENCOUNTER — Ambulatory Visit (INDEPENDENT_AMBULATORY_CARE_PROVIDER_SITE_OTHER): Payer: 59 | Admitting: Nurse Practitioner

## 2017-01-05 VITALS — BP 154/98 | Ht 64.0 in | Wt 181.0 lb

## 2017-01-05 DIAGNOSIS — I10 Essential (primary) hypertension: Secondary | ICD-10-CM | POA: Diagnosis not present

## 2017-01-05 DIAGNOSIS — E042 Nontoxic multinodular goiter: Secondary | ICD-10-CM | POA: Diagnosis not present

## 2017-01-06 LAB — TSH: TSH: 1.64 u[IU]/mL (ref 0.450–4.500)

## 2017-01-08 ENCOUNTER — Encounter: Payer: Self-pay | Admitting: Nurse Practitioner

## 2017-01-08 NOTE — Progress Notes (Signed)
Subjective: Presents for routine follow-up on her hypertension.  Compliant with medications but did not take her blood pressure pill this morning.  States she forgot it.  Her BP outside of the office runs 130/80 according to the patient.  Has been less active lately has noticed some weight gain.  No chest pain/ischemic type pain or unusual shortness of breath.  No TIA symptomatology.  States reflux is currently under control with occasional pantoprazole.  Exacerbation depending on what she eats.  Last seen by Dr. Oneida Alar 04/02/2015, had EGD.  Objective:   BP (!) 154/98   Ht 5\' 4"  (1.626 m)   Wt 181 lb (82.1 kg)   BMI 31.07 kg/m  NAD.  Alert, oriented.  Lungs clear.  Heart regular rate and rhythm.  No murmur or gallop noted.  Carotids no bruits or thrills.  Lower extremities no edema.  Abdomen soft nondistended nontender.  Thyroid multiple fine nodularity noted, no dominant mass or goiter.  Nontender to palpation.  Assessment:   Problem List Items Addressed This Visit      Cardiovascular and Mediastinum   Essential hypertension, benign - Primary     Endocrine   Multiple thyroid nodules   Relevant Orders   TSH (Completed)       Plan: Discussed importance of compliance with medications.  Strongly recommend preventive health physical and mammogram, patient defers at this time.  Obtain TSH. Return in about 6 months (around 07/05/2017) for routine follow up. Routine yearly lab work at that time.

## 2017-02-20 ENCOUNTER — Other Ambulatory Visit: Payer: Self-pay | Admitting: Nurse Practitioner

## 2017-03-12 ENCOUNTER — Other Ambulatory Visit: Payer: Self-pay | Admitting: Nurse Practitioner

## 2017-04-08 DIAGNOSIS — N926 Irregular menstruation, unspecified: Secondary | ICD-10-CM | POA: Diagnosis not present

## 2017-04-08 DIAGNOSIS — Z01419 Encounter for gynecological examination (general) (routine) without abnormal findings: Secondary | ICD-10-CM | POA: Diagnosis not present

## 2017-04-08 DIAGNOSIS — Z1231 Encounter for screening mammogram for malignant neoplasm of breast: Secondary | ICD-10-CM | POA: Diagnosis not present

## 2017-04-08 DIAGNOSIS — Z6831 Body mass index (BMI) 31.0-31.9, adult: Secondary | ICD-10-CM | POA: Diagnosis not present

## 2017-04-08 DIAGNOSIS — Z8041 Family history of malignant neoplasm of ovary: Secondary | ICD-10-CM | POA: Diagnosis not present

## 2017-04-13 DIAGNOSIS — D251 Intramural leiomyoma of uterus: Secondary | ICD-10-CM | POA: Diagnosis not present

## 2017-04-13 DIAGNOSIS — D252 Subserosal leiomyoma of uterus: Secondary | ICD-10-CM | POA: Diagnosis not present

## 2017-04-13 DIAGNOSIS — Z8041 Family history of malignant neoplasm of ovary: Secondary | ICD-10-CM | POA: Diagnosis not present

## 2017-05-04 ENCOUNTER — Other Ambulatory Visit: Payer: Self-pay | Admitting: Family Medicine

## 2017-07-06 ENCOUNTER — Encounter: Payer: Self-pay | Admitting: Nurse Practitioner

## 2017-07-06 ENCOUNTER — Ambulatory Visit (INDEPENDENT_AMBULATORY_CARE_PROVIDER_SITE_OTHER): Payer: 59 | Admitting: Nurse Practitioner

## 2017-07-06 VITALS — BP 148/90 | Ht 64.0 in | Wt 174.8 lb

## 2017-07-06 DIAGNOSIS — E785 Hyperlipidemia, unspecified: Secondary | ICD-10-CM | POA: Diagnosis not present

## 2017-07-06 DIAGNOSIS — K219 Gastro-esophageal reflux disease without esophagitis: Secondary | ICD-10-CM

## 2017-07-06 DIAGNOSIS — E559 Vitamin D deficiency, unspecified: Secondary | ICD-10-CM | POA: Diagnosis not present

## 2017-07-06 DIAGNOSIS — I10 Essential (primary) hypertension: Secondary | ICD-10-CM | POA: Diagnosis not present

## 2017-07-06 DIAGNOSIS — R1011 Right upper quadrant pain: Secondary | ICD-10-CM | POA: Diagnosis not present

## 2017-07-06 NOTE — Progress Notes (Signed)
Subjective:  Presents for recheck on several chronic issues. Gets her female physical at gyn. Regular vision and dental exams. Continues to smoke. Occasional flare up of GERD. Takes Protonix daily, increases to BID only if needed. Having RUQ pain at times. Mainly notices it when she is driving to work. Will occasionally radiate into the right mid back area. Has had a workup for gallbladder 2008/2009. No N/V or unexplained weight loss. Last endoscopy 04/02/15. Denies CP/ischemic type pain or SOB. No visual changes. No difficulty speaking or swallowing. No numbness or weakness of the face, arms or legs.   Objective:   BP (!) 148/90   Ht 5\' 4"  (1.626 m)   Wt 174 lb 12.8 oz (79.3 kg)   BMI 30.00 kg/m  NAD. Alert, oriented. Lungs clear. Heart RRR. Carotids no bruits or thrills. Abdomen soft, non distended with mild epigastric area tenderness radiating in the RUQ. No rebound or guarding. No obvious masses or organomegaly.   Assessment:  Problem List Items Addressed This Visit      Cardiovascular and Mediastinum   Essential hypertension, benign - Primary   Relevant Orders   Basic Metabolic Panel (BMET)     Digestive   GERD (gastroesophageal reflux disease)     Other   Hyperlipidemia   Relevant Orders   Lipid Profile   Vitamin D deficiency   Relevant Orders   Vitamin D (25 hydroxy)    Other Visit Diagnoses    Right upper quadrant abdominal pain       Relevant Orders   Hepatic function panel   Lipase   US Abdomen Limited RUQ     Plan: continue current medications. Abdominal US and labs pending. Warning signs reviewed. Call or go to ED if symptoms worsen. Discussed importance of smoking cessation. Further follow up based on test results.  Return in about 6 months (around 01/06/2018) for recheck.

## 2017-07-07 DIAGNOSIS — E785 Hyperlipidemia, unspecified: Secondary | ICD-10-CM | POA: Diagnosis not present

## 2017-07-07 DIAGNOSIS — I10 Essential (primary) hypertension: Secondary | ICD-10-CM | POA: Diagnosis not present

## 2017-07-07 DIAGNOSIS — Z0000 Encounter for general adult medical examination without abnormal findings: Secondary | ICD-10-CM | POA: Diagnosis not present

## 2017-07-07 DIAGNOSIS — R1011 Right upper quadrant pain: Secondary | ICD-10-CM | POA: Diagnosis not present

## 2017-07-08 ENCOUNTER — Other Ambulatory Visit: Payer: Self-pay | Admitting: Nurse Practitioner

## 2017-07-08 LAB — BASIC METABOLIC PANEL
BUN / CREAT RATIO: 13 (ref 9–23)
BUN: 15 mg/dL (ref 6–24)
CHLORIDE: 101 mmol/L (ref 96–106)
CO2: 24 mmol/L (ref 20–29)
Calcium: 11.1 mg/dL — ABNORMAL HIGH (ref 8.7–10.2)
Creatinine, Ser: 1.2 mg/dL — ABNORMAL HIGH (ref 0.57–1.00)
GFR calc Af Amer: 59 mL/min/{1.73_m2} — ABNORMAL LOW (ref >59)
GFR calc non Af Amer: 51 mL/min/{1.73_m2} — ABNORMAL LOW (ref >59)
GLUCOSE: 92 mg/dL (ref 65–99)
POTASSIUM: 4.8 mmol/L (ref 3.5–5.2)
SODIUM: 141 mmol/L (ref 134–144)

## 2017-07-08 LAB — LIPID PANEL
CHOL/HDL RATIO: 4.7 ratio — AB (ref 0.0–4.4)
CHOLESTEROL TOTAL: 206 mg/dL — AB (ref 100–199)
HDL: 44 mg/dL (ref >39)
LDL CALC: 143 mg/dL — AB (ref 0–99)
TRIGLYCERIDES: 93 mg/dL (ref 0–149)
VLDL Cholesterol Cal: 19 mg/dL (ref 5–40)

## 2017-07-08 LAB — HEPATIC FUNCTION PANEL
ALBUMIN: 4.5 g/dL (ref 3.5–5.5)
ALT: 20 IU/L (ref 0–32)
AST: 16 IU/L (ref 0–40)
Alkaline Phosphatase: 83 IU/L (ref 39–117)
Bilirubin Total: 0.4 mg/dL (ref 0.0–1.2)
Bilirubin, Direct: 0.08 mg/dL (ref 0.00–0.40)
TOTAL PROTEIN: 6.8 g/dL (ref 6.0–8.5)

## 2017-07-08 LAB — VITAMIN D 25 HYDROXY (VIT D DEFICIENCY, FRACTURES): VIT D 25 HYDROXY: 45.5 ng/mL (ref 30.0–100.0)

## 2017-07-08 LAB — LIPASE: Lipase: 24 U/L (ref 14–72)

## 2017-07-10 ENCOUNTER — Ambulatory Visit (HOSPITAL_COMMUNITY): Payer: 59

## 2017-07-14 ENCOUNTER — Ambulatory Visit (HOSPITAL_COMMUNITY)
Admission: RE | Admit: 2017-07-14 | Discharge: 2017-07-14 | Disposition: A | Discharge status: Home / Self Care | Payer: 59 | Source: Ambulatory Visit | Attending: Nurse Practitioner | Admitting: Nurse Practitioner

## 2017-07-14 DIAGNOSIS — R1011 Right upper quadrant pain: Secondary | ICD-10-CM | POA: Diagnosis not present

## 2017-07-14 DIAGNOSIS — K7689 Other specified diseases of liver: Secondary | ICD-10-CM | POA: Diagnosis not present

## 2017-07-27 ENCOUNTER — Encounter: Payer: Self-pay | Admitting: Nurse Practitioner

## 2017-07-29 ENCOUNTER — Ambulatory Visit (INDEPENDENT_AMBULATORY_CARE_PROVIDER_SITE_OTHER): Payer: 59 | Admitting: Family Medicine

## 2017-07-29 ENCOUNTER — Encounter: Payer: Self-pay | Admitting: Family Medicine

## 2017-07-29 VITALS — BP 142/94 | HR 82 | Temp 98.3°F | Ht 64.0 in | Wt 177.2 lb

## 2017-07-29 DIAGNOSIS — J329 Chronic sinusitis, unspecified: Secondary | ICD-10-CM

## 2017-07-29 MED ORDER — AMOXICILLIN-POT CLAVULANATE 875-125 MG PO TABS
ORAL_TABLET | ORAL | 0 refills | Status: DC
Start: 2017-07-29 — End: 2018-01-07

## 2017-07-29 MED ORDER — FLUCONAZOLE 150 MG PO TABS: ORAL_TABLET | ORAL | 0 refills | Status: DC

## 2017-07-29 NOTE — Progress Notes (Signed)
   Subjective:    Patient ID: Rise Mu, female    DOB: [birth year], 57 y.o.   MRN: [id number]  URI   This is a new problem. The current episode started 1 to 4 weeks ago. The problem has been gradually worsening. There has been no fever. Associated symptoms include congestion, coughing, headaches, sneezing and a sore throat. She has tried decongestant for the symptoms. The treatment provided mild relief.    Felt like cod and cog in the dhest  Taste in the throat  Coughing bad and bad  Now with tickle and both sides of the thoat  Right side orse   No major   Fever   No wheezing, ho hx of inhaler   Cutting dow to   reg beh medicine   Review of Systems  HENT: Positive for congestion, sneezing and sore throat.   Respiratory: Positive for cough.   Neurological: Positive for headaches.       Objective:   Physical Exam  Alert, mild malaise. Hydration good Vitals stable. frontal/ maxillary tenderness evident positive nasal congestion. pharynx normal neck supple  lungs clear/no crackles or wheezes. heart regular in rhythm       Assessment & Plan:  Impression rhinosinusitis likely post viral, discussed with patient. plan antibiotics prescribed. Questions answered. Symptomatic care discussed. warning signs discussed. WSL

## 2017-08-04 DIAGNOSIS — M9901 Segmental and somatic dysfunction of cervical region: Secondary | ICD-10-CM | POA: Diagnosis not present

## 2017-08-04 DIAGNOSIS — R51 Headache: Secondary | ICD-10-CM | POA: Diagnosis not present

## 2017-08-04 DIAGNOSIS — M62830 Muscle spasm of back: Secondary | ICD-10-CM | POA: Diagnosis not present

## 2017-08-04 DIAGNOSIS — M5412 Radiculopathy, cervical region: Secondary | ICD-10-CM | POA: Diagnosis not present

## 2017-08-06 DIAGNOSIS — M9901 Segmental and somatic dysfunction of cervical region: Secondary | ICD-10-CM | POA: Diagnosis not present

## 2017-08-06 DIAGNOSIS — M62830 Muscle spasm of back: Secondary | ICD-10-CM | POA: Diagnosis not present

## 2017-08-06 DIAGNOSIS — M5412 Radiculopathy, cervical region: Secondary | ICD-10-CM | POA: Diagnosis not present

## 2017-08-06 DIAGNOSIS — R51 Headache: Secondary | ICD-10-CM | POA: Diagnosis not present

## 2017-08-11 DIAGNOSIS — M5412 Radiculopathy, cervical region: Secondary | ICD-10-CM | POA: Diagnosis not present

## 2017-08-11 DIAGNOSIS — R51 Headache: Secondary | ICD-10-CM | POA: Diagnosis not present

## 2017-08-11 DIAGNOSIS — M62830 Muscle spasm of back: Secondary | ICD-10-CM | POA: Diagnosis not present

## 2017-08-11 DIAGNOSIS — M9901 Segmental and somatic dysfunction of cervical region: Secondary | ICD-10-CM | POA: Diagnosis not present

## 2017-08-13 DIAGNOSIS — M5412 Radiculopathy, cervical region: Secondary | ICD-10-CM | POA: Diagnosis not present

## 2017-08-13 DIAGNOSIS — M9901 Segmental and somatic dysfunction of cervical region: Secondary | ICD-10-CM | POA: Diagnosis not present

## 2017-08-13 DIAGNOSIS — M62830 Muscle spasm of back: Secondary | ICD-10-CM | POA: Diagnosis not present

## 2017-08-13 DIAGNOSIS — R51 Headache: Secondary | ICD-10-CM | POA: Diagnosis not present

## 2017-08-18 DIAGNOSIS — M9901 Segmental and somatic dysfunction of cervical region: Secondary | ICD-10-CM | POA: Diagnosis not present

## 2017-08-18 DIAGNOSIS — M62830 Muscle spasm of back: Secondary | ICD-10-CM | POA: Diagnosis not present

## 2017-08-18 DIAGNOSIS — M5412 Radiculopathy, cervical region: Secondary | ICD-10-CM | POA: Diagnosis not present

## 2017-08-18 DIAGNOSIS — R51 Headache: Secondary | ICD-10-CM | POA: Diagnosis not present

## 2017-08-25 DIAGNOSIS — M9901 Segmental and somatic dysfunction of cervical region: Secondary | ICD-10-CM | POA: Diagnosis not present

## 2017-08-25 DIAGNOSIS — R51 Headache: Secondary | ICD-10-CM | POA: Diagnosis not present

## 2017-08-25 DIAGNOSIS — M5412 Radiculopathy, cervical region: Secondary | ICD-10-CM | POA: Diagnosis not present

## 2017-08-25 DIAGNOSIS — M62830 Muscle spasm of back: Secondary | ICD-10-CM | POA: Diagnosis not present

## 2017-09-01 DIAGNOSIS — M5412 Radiculopathy, cervical region: Secondary | ICD-10-CM | POA: Diagnosis not present

## 2017-09-01 DIAGNOSIS — R51 Headache: Secondary | ICD-10-CM | POA: Diagnosis not present

## 2017-09-01 DIAGNOSIS — M62830 Muscle spasm of back: Secondary | ICD-10-CM | POA: Diagnosis not present

## 2017-09-01 DIAGNOSIS — M9901 Segmental and somatic dysfunction of cervical region: Secondary | ICD-10-CM | POA: Diagnosis not present

## 2017-09-08 DIAGNOSIS — M62830 Muscle spasm of back: Secondary | ICD-10-CM | POA: Diagnosis not present

## 2017-09-08 DIAGNOSIS — R51 Headache: Secondary | ICD-10-CM | POA: Diagnosis not present

## 2017-09-08 DIAGNOSIS — M9901 Segmental and somatic dysfunction of cervical region: Secondary | ICD-10-CM | POA: Diagnosis not present

## 2017-09-08 DIAGNOSIS — M5412 Radiculopathy, cervical region: Secondary | ICD-10-CM | POA: Diagnosis not present

## 2017-09-10 DIAGNOSIS — M5412 Radiculopathy, cervical region: Secondary | ICD-10-CM | POA: Diagnosis not present

## 2017-09-10 DIAGNOSIS — R51 Headache: Secondary | ICD-10-CM | POA: Diagnosis not present

## 2017-09-10 DIAGNOSIS — M9901 Segmental and somatic dysfunction of cervical region: Secondary | ICD-10-CM | POA: Diagnosis not present

## 2017-09-10 DIAGNOSIS — M62830 Muscle spasm of back: Secondary | ICD-10-CM | POA: Diagnosis not present

## 2017-09-17 DIAGNOSIS — M9901 Segmental and somatic dysfunction of cervical region: Secondary | ICD-10-CM | POA: Diagnosis not present

## 2017-09-17 DIAGNOSIS — R51 Headache: Secondary | ICD-10-CM | POA: Diagnosis not present

## 2017-09-17 DIAGNOSIS — M5412 Radiculopathy, cervical region: Secondary | ICD-10-CM | POA: Diagnosis not present

## 2017-09-17 DIAGNOSIS — M62830 Muscle spasm of back: Secondary | ICD-10-CM | POA: Diagnosis not present

## 2017-09-23 DIAGNOSIS — R51 Headache: Secondary | ICD-10-CM | POA: Diagnosis not present

## 2017-09-23 DIAGNOSIS — M62830 Muscle spasm of back: Secondary | ICD-10-CM | POA: Diagnosis not present

## 2017-09-23 DIAGNOSIS — M9901 Segmental and somatic dysfunction of cervical region: Secondary | ICD-10-CM | POA: Diagnosis not present

## 2017-09-23 DIAGNOSIS — M5412 Radiculopathy, cervical region: Secondary | ICD-10-CM | POA: Diagnosis not present

## 2017-09-30 DIAGNOSIS — M62830 Muscle spasm of back: Secondary | ICD-10-CM | POA: Diagnosis not present

## 2017-09-30 DIAGNOSIS — M5412 Radiculopathy, cervical region: Secondary | ICD-10-CM | POA: Diagnosis not present

## 2017-09-30 DIAGNOSIS — R51 Headache: Secondary | ICD-10-CM | POA: Diagnosis not present

## 2017-09-30 DIAGNOSIS — M9901 Segmental and somatic dysfunction of cervical region: Secondary | ICD-10-CM | POA: Diagnosis not present

## 2018-01-07 ENCOUNTER — Encounter: Payer: Self-pay | Admitting: Family Medicine

## 2018-01-07 ENCOUNTER — Ambulatory Visit (INDEPENDENT_AMBULATORY_CARE_PROVIDER_SITE_OTHER): Payer: 59 | Admitting: Family Medicine

## 2018-01-07 ENCOUNTER — Telehealth: Payer: Self-pay | Admitting: Family Medicine

## 2018-01-07 VITALS — BP 136/90 | Ht 64.0 in | Wt 182.4 lb

## 2018-01-07 DIAGNOSIS — R0981 Nasal congestion: Secondary | ICD-10-CM

## 2018-01-07 DIAGNOSIS — Z23 Encounter for immunization: Secondary | ICD-10-CM | POA: Diagnosis not present

## 2018-01-07 DIAGNOSIS — K219 Gastro-esophageal reflux disease without esophagitis: Secondary | ICD-10-CM

## 2018-01-07 DIAGNOSIS — E785 Hyperlipidemia, unspecified: Secondary | ICD-10-CM

## 2018-01-07 DIAGNOSIS — I10 Essential (primary) hypertension: Secondary | ICD-10-CM

## 2018-01-07 MED ORDER — HYDROCHLOROTHIAZIDE 25 MG PO TABS: ORAL_TABLET | ORAL | 1 refills | Status: DC

## 2018-01-07 MED ORDER — PANTOPRAZOLE SODIUM 40 MG PO TBEC: 40.0000 mg | DELAYED_RELEASE_TABLET | Freq: Two times a day (BID) | ORAL | 1 refills | Status: DC

## 2018-01-07 MED ORDER — FLUTICASONE PROPIONATE 50 MCG/ACT NA SUSP: NASAL | 12 refills | Status: DC

## 2018-01-07 MED ORDER — LOSARTAN POTASSIUM 100 MG PO TABS: ORAL_TABLET | ORAL | 1 refills | Status: DC

## 2018-01-07 NOTE — Progress Notes (Signed)
   Subjective:    Patient ID: Sandra Trujillo, female    DOB: 1961-01-16, 57 y.o.   MRN: [id number]  Hypertension  This is a chronic problem. The current episode started more than 1 year ago. Pertinent negatives include no chest pain or shortness of breath. Risk factors for coronary artery disease include post-menopausal state. Treatments tried: hctz, cozaar. There are no compliance problems.   Patient patient relates a little bit of a head cold but nothing bad she states it seems to be holding its own and gradually getting better  She still smokes she knows she needs to quit we have counseled her on this  Patient does have ongoing trouble with reflux.  Takes medication on a regular basis.  Tries to minimize foods as best they can.  They understand the importance of dietary compliance.  May also try to avoid eating a large meal close to bedtime.  Patient denies any dysphagia denies hematochezia.  States medicine does a good job keeping the problem under good control.  Without the medication may certainly have issues.They desire to continue taking their medication.  Patient has history of hyperlipidemia is not on medications prefers not to be on anything additional will watch her diet will agree to doing lab work She consents to doing a flu vaccine today   Patient reports no problems or concerns.  Review of Systems  Constitutional: Negative for activity change, appetite change and fatigue.  HENT: Negative for congestion and rhinorrhea.   Respiratory: Negative for cough and shortness of breath.   Cardiovascular: Negative for chest pain and leg swelling.  Gastrointestinal: Negative for abdominal pain and diarrhea.  Endocrine: Negative for polydipsia and polyphagia.  Skin: Negative for color change.  Neurological: Negative for dizziness and weakness.  Psychiatric/Behavioral: Negative for behavioral problems and confusion.       Objective:   Physical Exam  Constitutional: She appears  well-nourished. No distress.  HENT:  Head: Normocephalic and atraumatic.  Eyes: Right eye exhibits no discharge. Left eye exhibits no discharge.  Neck: No tracheal deviation present.  Cardiovascular: Normal rate, regular rhythm and normal heart sounds.  No murmur heard. Pulmonary/Chest: Effort normal and breath sounds normal. No respiratory distress.  Musculoskeletal: She exhibits no edema.  Lymphadenopathy:    She has no cervical adenopathy.  Neurological: She is alert. Coordination normal.  Skin: Skin is warm and dry.  Psychiatric: She has a normal mood and affect. Her behavior is normal.  Vitals reviewed.         Assessment & Plan:  HTN- Patient was seen today as part of a visit regarding hypertension. The importance of healthy diet and regular physical activity was discussed. The importance of compliance with medications discussed.  Ideal goal is to keep blood pressure low elevated levels certainly below 867/61 when possible.  The patient was counseled that keeping blood pressure under control lessen his risk of complications.  The importance of regular follow-ups was discussed with the patient.  Low-salt diet such as DASH recommended.  Regular physical activity was recommended as well.  Patient was advised to keep regular follow-ups.  The patient was seen today for GERD. Patient benefits from medication. Patient to continue medication. Keep all regular follow ups.  Hyperlipidemia watch diet check lab work await results  Recent sinus congestion probably viral physical exam checks out fine if worse call

## 2018-01-07 NOTE — Telephone Encounter (Signed)
When pt was checking out she wanted me to double check with Dr. Nicki Reaper to see if he did want her to get lab work done.

## 2018-01-08 ENCOUNTER — Other Ambulatory Visit: Payer: Self-pay | Admitting: Family Medicine

## 2018-01-08 DIAGNOSIS — I10 Essential (primary) hypertension: Secondary | ICD-10-CM

## 2018-01-08 DIAGNOSIS — E785 Hyperlipidemia, unspecified: Secondary | ICD-10-CM

## 2018-01-08 NOTE — Telephone Encounter (Signed)
Lipid, metabolic 7, this is already been addressed

## 2018-01-08 NOTE — Progress Notes (Signed)
Pt contacted, labs ordered. Pt verbalized understanding.

## 2018-01-10 LAB — LIPID PANEL
CHOLESTEROL TOTAL: 202 mg/dL — AB (ref 100–199)
Chol/HDL Ratio: 4.4 ratio (ref 0.0–4.4)
HDL: 46 mg/dL (ref >39)
LDL CALC: 137 mg/dL — AB (ref 0–99)
TRIGLYCERIDES: 97 mg/dL (ref 0–149)
VLDL Cholesterol Cal: 19 mg/dL (ref 5–40)

## 2018-01-10 LAB — BASIC METABOLIC PANEL
BUN/Creatinine Ratio: 14 (ref 9–23)
BUN: 17 mg/dL (ref 6–24)
CALCIUM: 11 mg/dL — AB (ref 8.7–10.2)
CHLORIDE: 101 mmol/L (ref 96–106)
CO2: 26 mmol/L (ref 20–29)
Creatinine, Ser: 1.23 mg/dL — ABNORMAL HIGH (ref 0.57–1.00)
GFR calc non Af Amer: 50 mL/min/{1.73_m2} — ABNORMAL LOW (ref >59)
GFR, EST AFRICAN AMERICAN: 57 mL/min/{1.73_m2} — AB (ref >59)
GLUCOSE: 93 mg/dL (ref 65–99)
Potassium: 4.6 mmol/L (ref 3.5–5.2)
Sodium: 143 mmol/L (ref 134–144)

## 2018-02-05 ENCOUNTER — Other Ambulatory Visit: Payer: Self-pay

## 2018-02-05 ENCOUNTER — Other Ambulatory Visit: Payer: Self-pay | Admitting: Family Medicine

## 2018-02-05 ENCOUNTER — Other Ambulatory Visit: Payer: Self-pay | Admitting: *Deleted

## 2018-02-05 DIAGNOSIS — I10 Essential (primary) hypertension: Secondary | ICD-10-CM

## 2018-02-05 DIAGNOSIS — E785 Hyperlipidemia, unspecified: Secondary | ICD-10-CM

## 2018-02-05 MED ORDER — ROSUVASTATIN CALCIUM 5 MG PO TABS: 5.0000 mg | ORAL_TABLET | Freq: Every day | ORAL | 1 refills | Status: DC

## 2018-07-05 ENCOUNTER — Other Ambulatory Visit: Payer: Self-pay | Admitting: Family Medicine

## 2018-07-05 NOTE — Telephone Encounter (Signed)
Follow-up visit or virtual visit with 90-day refill

## 2018-07-07 ENCOUNTER — Other Ambulatory Visit: Payer: Self-pay | Admitting: Family Medicine

## 2018-07-07 NOTE — Telephone Encounter (Signed)
Left message to return call 

## 2018-07-07 NOTE — Telephone Encounter (Signed)
Needs office visit or virtual visit then once she is scheduled may have refill

## 2018-07-08 NOTE — Telephone Encounter (Signed)
Virtual visit needs to be scheduled may have 1 refill

## 2018-07-09 NOTE — Telephone Encounter (Signed)
Left message to return call 

## 2018-07-14 ENCOUNTER — Other Ambulatory Visit: Payer: Self-pay | Admitting: Family Medicine

## 2018-07-14 NOTE — Telephone Encounter (Signed)
May have 1 refill needs virtual visit in June

## 2018-07-14 NOTE — Telephone Encounter (Signed)
Patient scheduled virtual visit with Dr Nicki Reaper 08/04/18

## 2018-08-04 ENCOUNTER — Ambulatory Visit (INDEPENDENT_AMBULATORY_CARE_PROVIDER_SITE_OTHER): Payer: 59 | Admitting: Family Medicine

## 2018-08-04 ENCOUNTER — Other Ambulatory Visit: Payer: Self-pay

## 2018-08-04 ENCOUNTER — Encounter: Payer: Self-pay | Admitting: Family Medicine

## 2018-08-04 VITALS — BP 135/94 | Wt 170.0 lb

## 2018-08-04 DIAGNOSIS — E559 Vitamin D deficiency, unspecified: Secondary | ICD-10-CM | POA: Diagnosis not present

## 2018-08-04 DIAGNOSIS — I10 Essential (primary) hypertension: Secondary | ICD-10-CM

## 2018-08-04 DIAGNOSIS — E785 Hyperlipidemia, unspecified: Secondary | ICD-10-CM

## 2018-08-04 MED ORDER — LOSARTAN POTASSIUM 100 MG PO TABS: ORAL_TABLET | ORAL | 1 refills | Status: DC

## 2018-08-04 MED ORDER — HYDROCHLOROTHIAZIDE 25 MG PO TABS: 25.0000 mg | ORAL_TABLET | Freq: Every day | ORAL | 1 refills | Status: DC

## 2018-08-04 MED ORDER — PANTOPRAZOLE SODIUM 40 MG PO TBEC: DELAYED_RELEASE_TABLET | ORAL | 1 refills | Status: DC

## 2018-08-04 MED ORDER — ROSUVASTATIN CALCIUM 5 MG PO TABS: 5.0000 mg | ORAL_TABLET | Freq: Every day | ORAL | 1 refills | Status: DC

## 2018-08-04 NOTE — Progress Notes (Signed)
Subjective:    Patient ID: Sandra Trujillo, female    DOB: 1960-12-25, 58 y.o.   MRN: [id number]  Hypertension This is a chronic problem. Pertinent negatives include no chest pain, headaches or shortness of breath. There are no compliance problems.   Pt states she is doing well. Pt checked BP this morning.   Patient for blood pressure check up.  The patient does have hypertension.  The patient is on medication.  Patient relates compliance with meds. Todays BP reviewed with the patient. Patient denies issues with medication. Patient relates reasonable diet. Patient tries to minimize salt. Patient aware of BP goals.  Patient here for follow-up regarding cholesterol.  The patient does have hyperlipidemia.  Patient does try to maintain a reasonable diet.  Patient does not take the medication on a regular basis.  She states that she forgot to keep on with the medicine and forgot to do her lab work..  The patient denies any obvious side effects.  Prior blood work results reviewed with the patient.  The patient is aware of his cholesterol goals and the need to keep it under good control to lessen the risk of disease.  Virtual Visit via Video Note  I connected with Sandra Trujillo on 08/04/18 at  8:30 AM EDT by a video enabled telemedicine application and verified that I am speaking with the correct person using two identifiers.  Location: Patient: home Provider: office   I discussed the limitations of evaluation and management by telemedicine and the availability of in person appointments. The patient expressed understanding and agreed to proceed.  History of Present Illness:    Observations/Objective:   Assessment and Plan:   Follow Up Instructions:    I discussed the assessment and treatment plan with the patient. The patient was provided an opportunity to ask questions and all were answered. The patient agreed with the plan and demonstrated an understanding of the instructions.   The  patient was advised to call back or seek an in-person evaluation if the symptoms worsen or if the condition fails to improve as anticipated.  I provided 15 minutes of non-face-to-face time during this encounter.   Vicente Males, LPN  Patient does use tobacco products.  Patient knows they should quit.  Patient is aware that smoking/in use of tobacco products increases their risk of heart disease, cancer, and COPD- lung issues.  Patient has been counseled to quit smoking/tobacco products.     Review of Systems  Constitutional: Negative for activity change, fatigue and fever.  HENT: Negative for congestion and rhinorrhea.   Respiratory: Negative for cough, chest tightness and shortness of breath.   Cardiovascular: Negative for chest pain and leg swelling.  Gastrointestinal: Negative for abdominal pain and nausea.  Skin: Negative for color change.  Neurological: Negative for dizziness and headaches.  Psychiatric/Behavioral: Negative for agitation and behavioral problems.       Objective:   Physical Exam   Patient had virtual visit Appears to be in no distress Atraumatic Neuro able to relate and oriented No apparent resp distress Color normal      Assessment & Plan:  HTN- Patient was seen today as part of a visit regarding hypertension. The importance of healthy diet and regular physical activity was discussed. The importance of compliance with medications discussed.  Ideal goal is to keep blood pressure low elevated levels certainly below 683/41 when possible.  The patient was counseled that keeping blood pressure under control lessen his risk of complications.  The importance of regular follow-ups was discussed with the patient.  Low-salt diet such as DASH recommended.  Regular physical activity was recommended as well.  Patient was advised to keep regular follow-ups.  Patient does use tobacco products.  Patient knows they should quit.  Patient is aware that smoking/in use  of tobacco products increases their risk of heart disease, cancer, and COPD- lung issues.  Patient has been counseled to quit smoking/tobacco products.  Hyperlipidemia she will resume her cholesterol medicine she will do lab work in about 8 weeks she will follow-up in 6 months

## 2018-09-28 ENCOUNTER — Other Ambulatory Visit: Payer: Self-pay | Admitting: Family Medicine

## 2018-10-14 ENCOUNTER — Other Ambulatory Visit: Payer: Self-pay | Admitting: Family Medicine

## 2019-07-13 ENCOUNTER — Encounter: Payer: Self-pay | Admitting: Family Medicine

## 2019-07-13 ENCOUNTER — Other Ambulatory Visit: Payer: Self-pay

## 2019-07-13 ENCOUNTER — Ambulatory Visit (INDEPENDENT_AMBULATORY_CARE_PROVIDER_SITE_OTHER): Payer: 59 | Admitting: Family Medicine

## 2019-07-13 VITALS — BP 148/90 | Temp 96.9°F | Wt 179.6 lb

## 2019-07-13 DIAGNOSIS — E559 Vitamin D deficiency, unspecified: Secondary | ICD-10-CM

## 2019-07-13 DIAGNOSIS — E785 Hyperlipidemia, unspecified: Secondary | ICD-10-CM

## 2019-07-13 DIAGNOSIS — I10 Essential (primary) hypertension: Secondary | ICD-10-CM | POA: Diagnosis not present

## 2019-07-13 DIAGNOSIS — G4453 Primary thunderclap headache: Secondary | ICD-10-CM | POA: Insufficient documentation

## 2019-07-13 MED ORDER — LOSARTAN POTASSIUM 100 MG PO TABS: ORAL_TABLET | ORAL | 1 refills | Status: DC

## 2019-07-13 MED ORDER — INDAPAMIDE 2.5 MG PO TABS: 2.5000 mg | ORAL_TABLET | Freq: Every day | ORAL | 1 refills | Status: DC

## 2019-07-13 NOTE — Progress Notes (Signed)
   Subjective:    Patient ID: Sandra Trujillo, female    DOB: 1960-10-23, 59 y.o.   MRN: [id number]  Hypertension This is a chronic problem. Pertinent negatives include no chest pain, headaches or shortness of breath. There are no compliance problems (pt checks blood pressure on a regular basis outside the office).    Pt states that a couple weeks ago she had a sharp pain the top part of her head; lasted for a few days. Top part of head is still sore. Pt does have frequent headaches. 160/100 when pt had sharp pain.  This is patient's Patient states when she had this headache the severity of the pain was well above a 10 out of 10 and it lasted for about 10 minutes it woke her up from her sleep it was very severe there was no numbness or tingling then the tenderness in her scalp lasted for hours  Review of Systems  Constitutional: Negative for activity change, fatigue and fever.  HENT: Negative for congestion and rhinorrhea.   Respiratory: Negative for cough, chest tightness and shortness of breath.   Cardiovascular: Negative for chest pain and leg swelling.  Gastrointestinal: Negative for abdominal pain and nausea.  Skin: Negative for color change.  Neurological: Negative for dizziness and headaches.  Psychiatric/Behavioral: Negative for agitation and behavioral problems.       Objective:   Physical Exam Vitals reviewed.  Constitutional:      General: She is not in acute distress. HENT:     Head: Normocephalic and atraumatic.  Eyes:     General:        Right eye: No discharge.        Left eye: No discharge.  Neck:     Trachea: No tracheal deviation.  Cardiovascular:     Rate and Rhythm: Normal rate and regular rhythm.     Heart sounds: Normal heart sounds. No murmur.  Pulmonary:     Effort: Pulmonary effort is normal. No respiratory distress.     Breath sounds: Normal breath sounds.  Lymphadenopathy:     Cervical: No cervical adenopathy.  Skin:    General: Skin is warm and  dry.  Neurological:     Mental Status: She is alert.     Coordination: Coordination normal.  Psychiatric:        Behavior: Behavior normal.           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

## 2019-07-14 ENCOUNTER — Telehealth: Payer: Self-pay | Admitting: Family Medicine

## 2019-07-14 NOTE — Telephone Encounter (Signed)
I will discuss with radiology the appropriate MRI for this patient's issue thunderclap headache additional medicine

## 2019-07-15 NOTE — Telephone Encounter (Signed)
I did discuss his case with neuroradiology  This patient had a severe thunderclap headache that occurred approximately 2 to 3 weeks ago in the middle of the night Therefore we are trying to rule out the possibility of aneurysm or subarachnoid bleed Given that it was several weeks ago CT scan not necessary instead MRI is better  Recommend the following MRI of the brain due to thunderclap headache MRA of the brain to rule out aneurysm due to thunderclap headache  Please set up thank you

## 2019-07-16 ENCOUNTER — Other Ambulatory Visit: Payer: Self-pay | Admitting: Family Medicine

## 2019-07-19 ENCOUNTER — Other Ambulatory Visit: Payer: Self-pay | Admitting: *Deleted

## 2019-07-19 DIAGNOSIS — G4453 Primary thunderclap headache: Secondary | ICD-10-CM

## 2019-07-19 MED ORDER — PANTOPRAZOLE SODIUM 40 MG PO TBEC: DELAYED_RELEASE_TABLET | ORAL | 0 refills | Status: DC

## 2019-07-19 NOTE — Telephone Encounter (Signed)
Patient notified of Dr. Bary Leriche recommendation and scans were ordered.

## 2019-07-20 ENCOUNTER — Telehealth: Payer: Self-pay | Admitting: *Deleted

## 2019-07-20 NOTE — Telephone Encounter (Signed)
Patient has been scheduled for MRI MRA June 14th arrive @ 6:30pm to [place] --patient and Dr. Nicki Reaper agreed this time and date was ok.

## 2019-07-21 LAB — HEPATIC FUNCTION PANEL
ALT: 20 IU/L (ref 0–32)
AST: 18 IU/L (ref 0–40)
Albumin: 4.8 g/dL (ref 3.8–4.9)
Alkaline Phosphatase: 114 IU/L (ref 48–121)
Bilirubin Total: 0.4 mg/dL (ref 0.0–1.2)
Bilirubin, Direct: 0.11 mg/dL (ref 0.00–0.40)
Total Protein: 7.4 g/dL (ref 6.0–8.5)

## 2019-07-21 LAB — LIPID PANEL
Chol/HDL Ratio: 5.4 ratio — ABNORMAL HIGH (ref 0.0–4.4)
Cholesterol, Total: 276 mg/dL — ABNORMAL HIGH (ref 100–199)
HDL: 51 mg/dL (ref >39)
LDL Chol Calc (NIH): 198 mg/dL — ABNORMAL HIGH (ref 0–99)
Triglycerides: 147 mg/dL (ref 0–149)
VLDL Cholesterol Cal: 27 mg/dL (ref 5–40)

## 2019-07-21 LAB — BASIC METABOLIC PANEL
BUN/Creatinine Ratio: 15 (ref 9–23)
BUN: 19 mg/dL (ref 6–24)
CO2: 26 mmol/L (ref 20–29)
Calcium: 11.6 mg/dL — ABNORMAL HIGH (ref 8.7–10.2)
Chloride: 100 mmol/L (ref 96–106)
Creatinine, Ser: 1.25 mg/dL — ABNORMAL HIGH (ref 0.57–1.00)
GFR calc Af Amer: 56 mL/min/{1.73_m2} — ABNORMAL LOW (ref >59)
GFR calc non Af Amer: 48 mL/min/{1.73_m2} — ABNORMAL LOW (ref >59)
Glucose: 96 mg/dL (ref 65–99)
Potassium: 4.4 mmol/L (ref 3.5–5.2)
Sodium: 140 mmol/L (ref 134–144)

## 2019-07-21 LAB — VITAMIN D 25 HYDROXY (VIT D DEFICIENCY, FRACTURES): Vit D, 25-Hydroxy: 35.1 ng/mL (ref 30.0–100.0)

## 2019-07-27 ENCOUNTER — Other Ambulatory Visit: Payer: Self-pay | Admitting: Family Medicine

## 2019-07-27 MED ORDER — ROSUVASTATIN CALCIUM 10 MG PO TABS
ORAL_TABLET | ORAL | 1 refills | Status: DC
Start: 2019-07-27 — End: 2019-10-31

## 2019-08-01 ENCOUNTER — Ambulatory Visit (HOSPITAL_COMMUNITY): Payer: 59

## 2019-08-01 ENCOUNTER — Ambulatory Visit (HOSPITAL_COMMUNITY)
Admission: RE | Admit: 2019-08-01 | Discharge: 2019-08-01 | Disposition: A | Discharge status: Home / Self Care | Payer: 59 | Source: Ambulatory Visit | Attending: Family Medicine | Admitting: Family Medicine

## 2019-08-01 DIAGNOSIS — G4453 Primary thunderclap headache: Secondary | ICD-10-CM | POA: Insufficient documentation

## 2019-08-03 ENCOUNTER — Other Ambulatory Visit: Payer: Self-pay | Admitting: *Deleted

## 2019-08-03 MED ORDER — LOSARTAN POTASSIUM 100 MG PO TABS: ORAL_TABLET | ORAL | 1 refills | Status: DC

## 2019-08-04 ENCOUNTER — Other Ambulatory Visit: Payer: Self-pay | Admitting: *Deleted

## 2019-08-13 LAB — VITAMIN D 1,25 DIHYDROXY
Vitamin D 1, 25 (OH)2 Total: 43 pg/mL
Vitamin D2 1, 25 (OH)2: <10 pg/mL
Vitamin D3 1, 25 (OH)2: 41 pg/mL

## 2019-08-13 LAB — PTH, INTACT AND CALCIUM
Calcium: 10.7 mg/dL — ABNORMAL HIGH (ref 8.7–10.2)
PTH: 50 pg/mL (ref 15–65)

## 2019-08-13 LAB — VITAMIN D 25 HYDROXY (VIT D DEFICIENCY, FRACTURES): Vit D, 25-Hydroxy: 32.6 ng/mL (ref 30.0–100.0)

## 2019-08-15 ENCOUNTER — Other Ambulatory Visit: Payer: Self-pay | Admitting: *Deleted

## 2019-08-15 DIAGNOSIS — E210 Primary hyperparathyroidism: Secondary | ICD-10-CM

## 2019-08-24 ENCOUNTER — Encounter: Payer: Self-pay | Admitting: Family Medicine

## 2019-09-24 DIAGNOSIS — M9901 Segmental and somatic dysfunction of cervical region: Secondary | ICD-10-CM | POA: Diagnosis not present

## 2019-09-24 DIAGNOSIS — M9903 Segmental and somatic dysfunction of lumbar region: Secondary | ICD-10-CM | POA: Diagnosis not present

## 2019-09-24 DIAGNOSIS — M5412 Radiculopathy, cervical region: Secondary | ICD-10-CM | POA: Diagnosis not present

## 2019-09-24 DIAGNOSIS — M62830 Muscle spasm of back: Secondary | ICD-10-CM | POA: Diagnosis not present

## 2019-10-06 ENCOUNTER — Other Ambulatory Visit: Payer: Self-pay

## 2019-10-06 ENCOUNTER — Ambulatory Visit: Payer: BC Managed Care – PPO | Admitting: "Endocrinology

## 2019-10-06 ENCOUNTER — Encounter: Payer: Self-pay | Admitting: "Endocrinology

## 2019-10-06 NOTE — Progress Notes (Signed)
Endocrinology Consult Note       10/06/2019, 4:45 PM  Sandra Trujillo is a 59 y.o.-year-old female, referred by her  Kathyrn Drown, MD  , for evaluation for hypercalcemia/hyperparathyroidism.   Past Medical History:  Diagnosis Date  . GERD (gastroesophageal reflux disease)   . Hyperlipidemia   . Hypertension     Past Surgical History:  Procedure Laterality Date  . CERVICAL DISCECTOMY    . COLONOSCOPY N/A 09/15/2014   SLF: 1. submucosal lesion in the hepatic flexure likely a lipoma 2 . mild diverticulosis in teh sigmoid colon and descending colon 3. the left colon is redundant  . COLONOSCOPY WITH ESOPHAGOGASTRODUODENOSCOPY (EGD)  12/2002   NWG:NFAOZ HH/mildchanges of reflux/otherwise normal. normal colonoscopy and terminal ileoscopy.  . ESOPHAGEAL DILATION N/A 04/02/2015   Procedure: ESOPHAGEAL DILATION;  Surgeon: Danie Binder, MD;  Location: [facility];  Service: Endoscopy;  Laterality: N/A;  . ESOPHAGOGASTRODUODENOSCOPY N/A 09/15/2014   SLF: 1. Schatzki ring at the gastroesophagel junction 2. moderate erosive gastritis/duoentitit and three small gastric ulcers due to ASA/ibuprofen  . ESOPHAGOGASTRODUODENOSCOPY N/A 04/02/2015   Procedure: ESOPHAGOGASTRODUODENOSCOPY (EGD);  Surgeon: Danie Binder, MD;  Location: [facility];  Service: Endoscopy;  Laterality: N/A;  1315 - moved to 2/13 @ 2:00  . SAVORY DILATION N/A 09/15/2014   Procedure: SAVORY DILATION;  Surgeon: Danie Binder, MD;  Location: [facility];  Service: Endoscopy;  Laterality: N/A;    Social History   Tobacco Use  . Smoking status: Current Every Day Smoker    Packs/day: 1.00    Years: 25.00    Pack years: 25.00    Types: Cigarettes  . Smokeless tobacco: Never Used  Vaping Use  . Vaping Use: Never used  Substance Use Topics  . Alcohol use: No    Alcohol/week: 0.0 standard drinks  . Drug use: No    Family History  Problem Relation Age of Onset   . Hypertension Father   . Heart failure Mother   . Stroke Mother   . Hypertension Mother   . Hyperlipidemia Mother   . Colon cancer Neg Hx   . Inflammatory bowel disease Neg Hx   . Liver disease Neg Hx     Outpatient Encounter Medications as of 10/06/2019  Medication Sig  . Cholecalciferol (VITAMIN D) 2000 UNITS CAPS Take 1 capsule by mouth daily.  Marland Kitchen docusate sodium (COLACE) 100 MG capsule Take 100 mg by mouth daily.  . fluticasone (FLONASE) 50 MCG/ACT nasal spray PLACE 2 SPRAYS INTO BOTH NOSTRILS DAILY.  . indapamide (LOZOL) 2.5 MG tablet Take 1 tablet (2.5 mg total) by mouth daily.  Marland Kitchen losartan (COZAAR) 100 MG tablet 1 qd  . pantoprazole (PROTONIX) 40 MG tablet TAKE 1 TABLET(40 MG) BY MOUTH TWICE DAILY BEFORE A MEAL  . rosuvastatin (CRESTOR) 10 MG tablet Take one tablet po each day   No facility-administered encounter medications on file as of 10/06/2019.    Allergies  Allergen Reactions  . Sulfa Antibiotics Other (See Comments)    "Flu like symptoms 10 times worse".  . Lisinopril Cough     HPI  [name] was first diagnosed  with hypercalcemia in 2019.  Patient has no previously known history of parathyroid, pituitary, adrenal dysfunctions; no family history of such dysfunctions.  She was never offered treatment for hypercalcemia. -Review of herreferral package of most recent labs reveals calcium of 11.1 , and recently 10.7 with the corresponding PTH of 50 on August 02, 2019.  -She does not have any recorded bone density.  No prior history of fragility fractures or falls. No history of  kidney stones.  No history of CKD.   she is not on HCTZ or other thiazide therapy.  She has history of vitamin D deficiency, currently on vitamin D supplement 2000 units daily.  No reported change. she is not on calcium supplements,  she eats dairy and green, leafy, vegetables on average amounts.  she does not have a family history of hypercalcemia, pituitary tumors, thyroid cancer, or  osteoporosis.   I reviewed her chart and she also has a history of hypertension, hyperlipidemia on treatment.    ROS:  Constitutional: no weight gain/loss, no fatigue, no subjective hyperthermia, no subjective hypothermia Eyes: no blurry vision, no xerophthalmia ENT: no sore throat, no nodules palpated in throat, no dysphagia/odynophagia, no hoarseness Cardiovascular: no Chest Pain, no Shortness of Breath, no palpitations, no leg swelling Respiratory: no cough, no shortness of breath  Gastrointestinal: no Nausea/Vomiting/Diarhhea Musculoskeletal: no muscle/joint aches Skin: no rashes Neurological: no tremors, no numbness, no tingling, no dizziness Psychiatric: no depression, no anxiety  PE: BP 118/81   Pulse 92   Ht 5\' 4"  (1.626 m)   Wt 178 lb 3.2 oz (80.8 kg)   BMI 30.59 kg/m , Body mass index is 30.59 kg/m. Wt Readings from Last 3 Encounters:  10/06/19 178 lb 3.2 oz (80.8 kg)  07/13/19 179 lb 9.6 oz (81.5 kg)  08/04/18 170 lb (77.1 kg)    Constitutional: + BMI of 30.9, not in acute distress, normal state of mind Eyes: PERRLA, EOMI, no exophthalmos ENT: moist mucous membranes, no gross thyromegaly, no gross cervical lymphadenopathy Cardiovascular: normal precordial activity, Regular Rate and Rhythm, no Murmur/Rubs/Gallops Respiratory:  adequate breathing efforts, no gross chest deformity, Clear to auscultation bilaterally Gastrointestinal: abdomen soft, Non -tender, No distension, Bowel Sounds present Musculoskeletal: no gross deformities, strength intact in all four extremities Skin: moist, warm, no rashes Neurological: no tremor with outstretched hands, Deep tendon reflexes normal in bilateral lower extremities.     CMP ( most recent) CMP     Component Value Date/Time   NA 140 07/20/2019 1024   NA 140 01/10/2013 1120   K 4.4 07/20/2019 1024   K 4.2 01/10/2013 1120   CL 100 07/20/2019 1024   CL 109 (H) 01/10/2013 1120   CO2 26 07/20/2019 1024   CO2 25 01/10/2013  1120   GLUCOSE 96 07/20/2019 1024   GLUCOSE 89 01/10/2013 1120   BUN 19 07/20/2019 1024   BUN 9 01/10/2013 1120   CREATININE 1.25 (H) 07/20/2019 1024   CREATININE 1.07 01/10/2013 1120   CREATININE 1.19 (H) 12/30/2012 0914   CALCIUM 10.7 (H) 08/05/2019 1634   CALCIUM 9.0 01/10/2013 1120   PROT 7.4 07/20/2019 1024   ALBUMIN 4.8 07/20/2019 1024   AST 18 07/20/2019 1024   ALT 20 07/20/2019 1024   ALKPHOS 114 07/20/2019 1024   BILITOT 0.4 07/20/2019 1024   GFRNONAA 48 (L) 07/20/2019 1024   GFRNONAA >60 01/10/2013 1120   GFRAA 56 (L) 07/20/2019 1024   GFRAA >60 01/10/2013 1120     Diabetic Labs (most recent): Lab  Results  Component Value Date   HGBA1C (H) 06/02/2009    5.9 (NOTE)                                                                       According to the ADA Clinical Practice Recommendations for 2011, when HbA1c is used as a screening test:   >=6.5%   Diagnostic of Diabetes Mellitus           (if abnormal result  is confirmed)  5.7-6.4%   Increased risk of developing Diabetes Mellitus  References:Diagnosis and Classification of Diabetes Mellitus,Diabetes PTWS,5681,27(NTZGY 1):S62-S69 and Standards of Medical Care in         Diabetes - 2011,Diabetes Care,2011,34  (Suppl 1):S11-S61.     Lipid Panel ( most recent) Lipid Panel     Component Value Date/Time   CHOL 276 (H) 07/20/2019 1024   TRIG 147 07/20/2019 1024   HDL 51 07/20/2019 1024   CHOLHDL 5.4 (H) 07/20/2019 1024   CHOLHDL 3.7 12/30/2012 0914   VLDL 17 12/30/2012 0914   LDLCALC 198 (H) 07/20/2019 1024   LABVLDL 27 07/20/2019 1024        Assessment: 1. Hypercalcemia / Hyperparathyroidism  Plan: Patient has had several instances of elevated calcium, with the highest level being at 11.1 mg/dL. A corresponding intact PTH level inappropriately normal at 50.    - Patient also  has vitamin D deficiency,  with the last level being 32.5 after supplement.  -It is likely that she has primary  hyperparathyroidism. - No apparent complications from hypercalcemia/hyperparathyroidism: no history of  nephrolithiasis,  osteoporosis,fragility fractures. No abdominal pain, no major mood disorders, no bone pain.  - I discussed with the patient about the physiology of calcium and parathyroid hormone, and possible  effects of  increased PTH/ Calcium , including kidney stones, cardiac dysrhythmias, osteoporosis, abdominal pain, etc.   - The work up so far is not sufficient to reach a conclusion for definitive therapy.  she  needs more studies to confirm and classify the parathyroid dysfunction she may have. I will proceed to obtain  repeat intact PTH/calcium, serum magnesium, serum phosphorus.  It is also essential to obtain 24-hour urine calcium/creatinine to rule out the rare but important cause of mild elevation in calcium and PTH- [place] ( Familial Hypocalciuric Hypercalcemia), which may not require any active intervention.  - I will request for her next DEXA scan to include the distal  33% of  radius for evaluation of cortical bone, which is predominantly affected by hyperparathyroidism.  -If she is confirmed to have primary hyperparathyroidism, she is a surgical candidate. She is advised to continue follow-up with her PMD Dr. Sallee Lange.  - Time spent with the patient: 60 minutes, of which >50% was spent in obtaining information about her symptoms, reviewing her previous labs, evaluations, and treatments, counseling her about her hypercalcemia, vitamin D deficiency, and developing a plan to confirm the diagnosis and long term treatment as necessary.  Please refer to " Patient Self Inventory" in the Media  tab for reviewed elements of pertinent patient history.  Victorino Sparrow Hoyos participated in the discussions, expressed understanding, and voiced agreement with the above plans.  All questions were answered to her satisfaction. she is encouraged  to contact clinic should she have any questions or  concerns prior to her return visit.  - Return in about 2 weeks (around 10/20/2019), or In Person, for Labs Today- Non-Fasting Ok, 24 Hr Urine Ca & Cr, DXA Scan B4 NV.   Glade Lloyd, MD [facility] [address] [place], [place] [address] Phone: [phone]  Fax: [phone]    This note was partially dictated with voice recognition software. Similar sounding words can be transcribed inadequately or may not  be corrected upon review.  10/06/2019, 4:45 PM

## 2019-10-11 LAB — PTH, INTACT AND CALCIUM
Calcium: 11.1 mg/dL — ABNORMAL HIGH (ref 8.7–10.2)
PTH: 65 pg/mL (ref 15–65)

## 2019-10-11 LAB — CREATININE, URINE, 24 HOUR
Creatinine, 24H Ur: 1112 mg/24 hr (ref 800–1800)
Creatinine, Urine: 158.9 mg/dL

## 2019-10-11 LAB — CALCIUM, URINE, 24 HOUR
Calcium, 24H Urine: 31 mg/24 hr — ABNORMAL LOW (ref 47–462)
Calcium, Urine: 4.1 mg/dL

## 2019-10-11 LAB — MAGNESIUM: Magnesium: 2.1 mg/dL (ref 1.6–2.3)

## 2019-10-11 LAB — PHOSPHORUS: Phosphorus: 2.7 mg/dL — ABNORMAL LOW (ref 3.0–4.3)

## 2019-10-14 ENCOUNTER — Other Ambulatory Visit: Payer: Self-pay | Admitting: *Deleted

## 2019-10-14 DIAGNOSIS — E785 Hyperlipidemia, unspecified: Secondary | ICD-10-CM

## 2019-10-14 DIAGNOSIS — Z79899 Other long term (current) drug therapy: Secondary | ICD-10-CM

## 2019-10-21 ENCOUNTER — Ambulatory Visit: Payer: BC Managed Care – PPO | Admitting: "Endocrinology

## 2019-10-25 DIAGNOSIS — Z79899 Other long term (current) drug therapy: Secondary | ICD-10-CM | POA: Diagnosis not present

## 2019-10-25 DIAGNOSIS — E785 Hyperlipidemia, unspecified: Secondary | ICD-10-CM | POA: Diagnosis not present

## 2019-10-26 LAB — HEPATIC FUNCTION PANEL
ALT: 18 IU/L (ref 0–32)
AST: 17 IU/L (ref 0–40)
Albumin: 4.5 g/dL (ref 3.8–4.9)
Alkaline Phosphatase: 101 IU/L (ref 48–121)
Bilirubin Total: 0.4 mg/dL (ref 0.0–1.2)
Bilirubin, Direct: 0.12 mg/dL (ref 0.00–0.40)
Total Protein: 6.7 g/dL (ref 6.0–8.5)

## 2019-10-26 LAB — LIPID PANEL
Chol/HDL Ratio: 4.1 ratio (ref 0.0–4.4)
Cholesterol, Total: 183 mg/dL (ref 100–199)
HDL: 45 mg/dL (ref >39)
LDL Chol Calc (NIH): 116 mg/dL — ABNORMAL HIGH (ref 0–99)
Triglycerides: 122 mg/dL (ref 0–149)
VLDL Cholesterol Cal: 22 mg/dL (ref 5–40)

## 2019-10-29 DIAGNOSIS — M62830 Muscle spasm of back: Secondary | ICD-10-CM | POA: Diagnosis not present

## 2019-10-29 DIAGNOSIS — M5412 Radiculopathy, cervical region: Secondary | ICD-10-CM | POA: Diagnosis not present

## 2019-10-29 DIAGNOSIS — M9901 Segmental and somatic dysfunction of cervical region: Secondary | ICD-10-CM | POA: Diagnosis not present

## 2019-10-29 DIAGNOSIS — M9903 Segmental and somatic dysfunction of lumbar region: Secondary | ICD-10-CM | POA: Diagnosis not present

## 2019-10-31 MED ORDER — ROSUVASTATIN CALCIUM 20 MG PO TABS: 20.0000 mg | ORAL_TABLET | Freq: Every day | ORAL | 5 refills | Status: DC

## 2019-10-31 NOTE — Addendum Note (Signed)
Addended by: Patsy Lager on: 10/31/2019 01:58 PM   Modules accepted: Orders

## 2019-11-01 ENCOUNTER — Other Ambulatory Visit: Payer: Self-pay

## 2019-11-01 ENCOUNTER — Ambulatory Visit (HOSPITAL_COMMUNITY)
Admission: RE | Admit: 2019-11-01 | Discharge: 2019-11-01 | Disposition: A | Discharge status: Home / Self Care | Payer: BC Managed Care – PPO | Source: Ambulatory Visit | Attending: "Endocrinology | Admitting: "Endocrinology

## 2019-11-01 DIAGNOSIS — Z13820 Encounter for screening for osteoporosis: Secondary | ICD-10-CM | POA: Diagnosis not present

## 2019-11-12 DIAGNOSIS — M5412 Radiculopathy, cervical region: Secondary | ICD-10-CM | POA: Diagnosis not present

## 2019-11-12 DIAGNOSIS — M62830 Muscle spasm of back: Secondary | ICD-10-CM | POA: Diagnosis not present

## 2019-11-12 DIAGNOSIS — M9901 Segmental and somatic dysfunction of cervical region: Secondary | ICD-10-CM | POA: Diagnosis not present

## 2019-11-12 DIAGNOSIS — M9903 Segmental and somatic dysfunction of lumbar region: Secondary | ICD-10-CM | POA: Diagnosis not present

## 2019-11-20 ENCOUNTER — Other Ambulatory Visit: Payer: Self-pay | Admitting: Family Medicine

## 2019-11-22 ENCOUNTER — Other Ambulatory Visit: Payer: Self-pay

## 2019-11-22 ENCOUNTER — Ambulatory Visit (INDEPENDENT_AMBULATORY_CARE_PROVIDER_SITE_OTHER): Payer: BC Managed Care – PPO | Admitting: "Endocrinology

## 2019-11-22 ENCOUNTER — Encounter: Payer: Self-pay | Admitting: "Endocrinology

## 2019-11-22 NOTE — Progress Notes (Signed)
11/22/2019, 8:45 PM   Endocrinology follow-up note  Sandra Trujillo is a 59 y.o.-year-old female, referred by her  Sandra Drown, MD  . -She is here for follow-up after she was seen in consultation for evaluation for hypercalcemia/hyperparathyroidism.   Past Medical History:  Diagnosis Date  . GERD (gastroesophageal reflux disease)   . Hyperlipidemia   . Hypertension     Past Surgical History:  Procedure Laterality Date  . CERVICAL DISCECTOMY    . COLONOSCOPY N/A 09/15/2014   SLF: 1. submucosal lesion in the hepatic flexure likely a lipoma 2 . mild diverticulosis in teh sigmoid colon and descending colon 3. the left colon is redundant  . COLONOSCOPY WITH ESOPHAGOGASTRODUODENOSCOPY (EGD)  12/2002   WUX:LKGMW HH/mildchanges of reflux/otherwise normal. normal colonoscopy and terminal ileoscopy.  . ESOPHAGEAL DILATION N/A 04/02/2015   Procedure: ESOPHAGEAL DILATION;  Surgeon: Danie Binder, MD;  [facility]: [facility];  Service: Endoscopy;  Laterality: N/A;  . ESOPHAGOGASTRODUODENOSCOPY N/A 09/15/2014   SLF: 1. Schatzki ring at the gastroesophagel junction 2. moderate erosive gastritis/duoentitit and three small gastric ulcers due to ASA/ibuprofen  . ESOPHAGOGASTRODUODENOSCOPY N/A 04/02/2015   Procedure: ESOPHAGOGASTRODUODENOSCOPY (EGD);  Surgeon: Danie Binder, MD;  Location: [facility];  Service: Endoscopy;  Laterality: N/A;  1315 - moved to 2/13 @ 2:00  . SAVORY DILATION N/A 09/15/2014   Procedure: SAVORY DILATION;  Surgeon: Danie Binder, MD;  Location: [facility];  Service: Endoscopy;  Laterality: N/A;    Social History   Tobacco Use  . Smoking status: Current Every Day Smoker    Packs/day: 1.00    Years: 25.00    Pack years: 25.00    Types: Cigarettes  . Smokeless tobacco: Never Used  Vaping Use  . Vaping Use: Never used  Substance Use Topics  . Alcohol use: No    Alcohol/week: 0.0 standard drinks  .  Drug use: No    Family History  Problem Relation Age of Onset  . Hypertension Father   . Heart failure Mother   . Stroke Mother   . Hypertension Mother   . Hyperlipidemia Mother   . Colon cancer Neg Hx   . Inflammatory bowel disease Neg Hx   . Liver disease Neg Hx     Outpatient Encounter Medications as of 11/22/2019  Medication Sig  . Cholecalciferol (VITAMIN D) 2000 UNITS CAPS Take 1 capsule by mouth daily.  Marland Kitchen docusate sodium (COLACE) 100 MG capsule Take 100 mg by mouth daily.  . fluticasone (FLONASE) 50 MCG/ACT nasal spray SHAKE LIQUID AND USE 2 SPRAYS IN EACH NOSTRIL DAILY  . indapamide (LOZOL) 2.5 MG tablet Take 1 tablet (2.5 mg total) by mouth daily.  Marland Kitchen losartan (COZAAR) 100 MG tablet 1 qd  . pantoprazole (PROTONIX) 40 MG tablet TAKE 1 TABLET(40 MG) BY MOUTH TWICE DAILY BEFORE A MEAL  . rosuvastatin (CRESTOR) 20 MG tablet Take 1 tablet (20 mg total) by mouth daily.   No facility-administered encounter medications on file as of 11/22/2019.    Allergies  Allergen Reactions  . Sulfa Antibiotics Other (See Comments)    "Flu like symptoms 10 times worse".  Marland Kitchen  Lisinopril Cough     HPI  Sandra Trujillo was first diagnosed with hypercalcemia in 2019.  Patient has no previously known history of parathyroid, pituitary, adrenal dysfunctions; no family history of such dysfunctions.  She was never offered treatment for hypercalcemia. -Her previsit labs show calcium of 11.1 increasing from 10.7, with corresponding PTH of 65, increasing from 50.    -Her bone density is normal.  No prior history of fragility fractures or falls. No history of  kidney stones.  No history of CKD.   she is not on HCTZ or other thiazide therapy.  She has history of vitamin D deficiency, currently on vitamin D supplement 2000 units daily.  No reported change. she is not on calcium supplements,  she eats dairy and green, leafy, vegetables on average amounts.  she does not have a family history of  hypercalcemia, pituitary tumors, thyroid cancer, or osteoporosis.   I reviewed her chart and she also has a history of hypertension, hyperlipidemia on treatment.    ROS: Dated as above. PE: BP 115/81   Pulse 87   Ht 5\' 4"  (1.626 m)   Wt 179 lb (81.2 kg)   BMI 30.73 kg/m , Body mass index is 30.73 kg/m. Wt Readings from Last 3 Encounters:  11/22/19 179 lb (81.2 kg)  10/06/19 178 lb 3.2 oz (80.8 kg)  07/13/19 179 lb 9.6 oz (81.5 kg)     Physical Exam- Limited  Constitutional:  Body mass index is 30.73 kg/m. , not in acute distress, normal state of mind Eyes:  EOMI, no exophthalmos Neck: Supple Thyroid: No gross goiter Respiratory: Adequate breathing efforts Musculoskeletal: no gross deformities, strength intact in all four extremities, no gross restriction of joint movements Skin:  no rashes, no hyperemia Neurological: no tremor with outstretched hands      CMP ( most recent) CMP     Component Value Date/Time   NA 140 07/20/2019 1024   NA 140 01/10/2013 1120   K 4.4 07/20/2019 1024   K 4.2 01/10/2013 1120   CL 100 07/20/2019 1024   CL 109 (H) 01/10/2013 1120   CO2 26 07/20/2019 1024   CO2 25 01/10/2013 1120   GLUCOSE 96 07/20/2019 1024   GLUCOSE 89 01/10/2013 1120   BUN 19 07/20/2019 1024   BUN 9 01/10/2013 1120   CREATININE 1.25 (H) 07/20/2019 1024   CREATININE 1.07 01/10/2013 1120   CREATININE 1.19 (H) 12/30/2012 0914   CALCIUM 11.1 (H) 10/10/2019 1532   CALCIUM 9.0 01/10/2013 1120   PROT 6.7 10/25/2019 0809   ALBUMIN 4.5 10/25/2019 0809   AST 17 10/25/2019 0809   ALT 18 10/25/2019 0809   ALKPHOS 101 10/25/2019 0809   BILITOT 0.4 10/25/2019 0809   GFRNONAA 48 (L) 07/20/2019 1024   GFRNONAA >60 01/10/2013 1120   GFRAA 56 (L) 07/20/2019 1024   GFRAA >60 01/10/2013 1120     Diabetic Labs (most recent): Lab Results  Component Value Date   HGBA1C (H) 06/02/2009    5.9 (NOTE)                                                                        According to the ADA Clinical Practice Recommendations for 2011, when HbA1c is  used as a screening test:   >=6.5%   Diagnostic of Diabetes Mellitus           (if abnormal result  is confirmed)  5.7-6.4%   Increased risk of developing Diabetes Mellitus  References:Diagnosis and Classification of Diabetes Mellitus,Diabetes DPOE,4235,36(RWERX 1):S62-S69 and Standards of Medical Care in         Diabetes - 2011,Diabetes VQMG,8676,19  (Suppl 1):S11-S61.     Lipid Panel ( most recent) Lipid Panel     Component Value Date/Time   CHOL 183 10/25/2019 0809   TRIG 122 10/25/2019 0809   HDL 45 10/25/2019 0809   CHOLHDL 4.1 10/25/2019 0809   CHOLHDL 3.7 12/30/2012 0914   VLDL 17 12/30/2012 0914   LDLCALC 116 (H) 10/25/2019 0809   LABVLDL 22 10/25/2019 0809     Recent Results (from the past 2160 hour(s))  Calcium, urine, 24 hour     Status: Abnormal   Collection Time: 10/10/19  2:06 PM  Result Value Ref Range   Calcium, Urine 4.1 Not Estab. mg/dL   Calcium, 24H Urine 31 (L) 47 - 462 mg/24 hr  Creatinine, urine, 24 hour     Status: None   Collection Time: 10/10/19  2:09 PM  Result Value Ref Range   Creatinine, Urine 158.9 Not Estab. mg/dL   Creatinine, 24H Ur 1,112 800 - 1,800 mg/24 hr  PTH, intact and calcium     Status: Abnormal   Collection Time: 10/10/19  3:32 PM  Result Value Ref Range   Calcium 11.1 (H) 8.7 - 10.2 mg/dL   PTH 65 15 - 65 pg/mL   PTH Interp Comment     Comment: Interpretation                 Intact PTH    Calcium                                 (pg/mL)      (mg/dL) Normal                          15 - 65     8.6 - 10.2 Primary Hyperparathyroidism         >65          >10.2 Secondary Hyperparathyroidism       >65          <10.2 Non-Parathyroid Hypercalcemia       <65          >10.2 Hypoparathyroidism                  <15          < 8.6 Non-Parathyroid Hypocalcemia    15 - 65          < 8.6   Magnesium     Status: None   Collection Time: 10/10/19  3:32 PM  Result  Value Ref Range   Magnesium 2.1 1.6 - 2.3 mg/dL  Phosphorus     Status: Abnormal   Collection Time: 10/10/19  3:32 PM  Result Value Ref Range   Phosphorus 2.7 (L) 3.0 - 4.3 mg/dL  Lipid panel     Status: Abnormal   Collection Time: 10/25/19  8:09 AM  Result Value Ref Range   Cholesterol, Total 183 100 - 199 mg/dL   Triglycerides 122 0 - 149 mg/dL   HDL 45 >39 mg/dL  VLDL Cholesterol Cal 22 5 - 40 mg/dL   LDL Chol Calc (NIH) 116 (H) 0 - 99 mg/dL   Chol/HDL Ratio 4.1 0.0 - 4.4 ratio    Comment:                                   T. Chol/HDL Ratio                                             Men  Women                               1/2 Avg.Risk  3.4    3.3                                   Avg.Risk  5.0    4.4                                2X Avg.Risk  9.6    7.1                                3X Avg.Risk 23.4   11.0   Hepatic function panel     Status: None   Collection Time: 10/25/19  8:09 AM  Result Value Ref Range   Total Protein 6.7 6.0 - 8.5 g/dL   Albumin 4.5 3.8 - 4.9 g/dL   Bilirubin Total 0.4 0.0 - 1.2 mg/dL   Bilirubin, Direct 0.12 0.00 - 0.40 mg/dL   Alkaline Phosphatase 101 48 - 121 IU/L    Comment: **Effective October 31, 2019 Alkaline Phosphatase**   reference interval will be changing to:              Age                Female          Female           0 -  5 days         67 - 127       12 - 127           6 - 10 days         1 - 242       21 - 68          11 - 20 days        109 - 357      109 - 357          21 - 30 days         94 - 494       94 - 494           1 -  2 months      149 - 539      149 - 539           3 -  6 months      131 - 452      131 - 452  7 - 11 months      117 - 401      117 - 401   12 months -  6 years       158 - 369      158 - 369           7 - 12 years       150 - 409      150 - 409               13 years       156 - 435       17 - 227               14 years       114 - 375       71 - 161               15 years        18 -  279       57 - 134               16 years        58 - 207       53 - 121               17 years        60 - 161       33 - 113          18 - 20 years        61 - 125       42 - 106              >20 years         44 - 121       44 - 121    AST 17 0 - 40 IU/L   ALT 18 0 - 32 IU/L      Assessment: 1. Hypercalcemia / Hyperparathyroidism  Plan: Patient has had several instances of elevated calcium, with the highest level being at 7.1 mg per DL associated with higher PTH of 65.     - Patient also  has vitamin D deficiency,  with the last level being 32.5 after supplement.  -Her work-up is consistent with hypercalcemia due to primary hyperparathyroidism.  - No apparent complications from hypercalcemia/hyperparathyroidism: no history of  nephrolithiasis,  osteoporosis,fragility fractures. No abdominal pain, no major mood disorders, no bone pain.  -She is a surgical candidate.  However, she wishes to delay her surgery, as she has just started a new job and waiting for her benefits.  - I discussed with the patient about the physiology of calcium and parathyroid hormone, and possible  effects of  increased PTH/ Calcium , including kidney stones, cardiac dysrhythmias, osteoporosis, abdominal pain, etc.   -She will return in 3 months with repeat PTH/calcium, as well as repeat 24-hour urine calcium measurements.  Before this visit, her 24-hour urine calcium was low at 31.  Unlikely for her to have [place] with significant chronic hypercalcemia, high normal PTH.   She is advised to continue follow-up with her PMD Dr. Sallee Lange.     - Time spent on this patient care encounter:  20 minutes of which 50% was spent in  counseling and the rest reviewing  her current and  previous labs / studies and medications  doses and developing a plan for long term care. Rise Mu  participated  in the discussions, expressed understanding, and voiced agreement with the above plans.  All questions were answered to  her satisfaction. she is encouraged to contact clinic should she have any questions or concerns prior to her return visit.  - Return in about 3 months (around 02/22/2020) for 24 Hr Urine Ca & Cr, F/U with Pre-visit Labs.   Glade Lloyd, MD [facility] [address] [place], [place] [address] Phone: [phone]  Fax: [phone]    This note was partially dictated with voice recognition software. Similar sounding words can be transcribed inadequately or may not  be corrected upon review.  11/22/2019, 8:45 PM

## 2019-11-26 DIAGNOSIS — M62830 Muscle spasm of back: Secondary | ICD-10-CM | POA: Diagnosis not present

## 2019-11-26 DIAGNOSIS — M9903 Segmental and somatic dysfunction of lumbar region: Secondary | ICD-10-CM | POA: Diagnosis not present

## 2019-11-26 DIAGNOSIS — M5412 Radiculopathy, cervical region: Secondary | ICD-10-CM | POA: Diagnosis not present

## 2019-11-26 DIAGNOSIS — M9901 Segmental and somatic dysfunction of cervical region: Secondary | ICD-10-CM | POA: Diagnosis not present

## 2019-12-05 ENCOUNTER — Other Ambulatory Visit: Payer: Self-pay | Admitting: Family Medicine

## 2019-12-10 DIAGNOSIS — M62830 Muscle spasm of back: Secondary | ICD-10-CM | POA: Diagnosis not present

## 2019-12-10 DIAGNOSIS — M9903 Segmental and somatic dysfunction of lumbar region: Secondary | ICD-10-CM | POA: Diagnosis not present

## 2019-12-10 DIAGNOSIS — M5412 Radiculopathy, cervical region: Secondary | ICD-10-CM | POA: Diagnosis not present

## 2019-12-10 DIAGNOSIS — M9901 Segmental and somatic dysfunction of cervical region: Secondary | ICD-10-CM | POA: Diagnosis not present

## 2019-12-21 ENCOUNTER — Encounter: Payer: Self-pay | Admitting: Family Medicine

## 2019-12-28 ENCOUNTER — Ambulatory Visit: Payer: BC Managed Care – PPO | Admitting: Family Medicine

## 2019-12-28 ENCOUNTER — Other Ambulatory Visit: Payer: Self-pay

## 2019-12-28 ENCOUNTER — Encounter: Payer: Self-pay | Admitting: Family Medicine

## 2019-12-28 VITALS — BP 128/88 | HR 100 | Temp 96.7°F | Wt 181.0 lb

## 2019-12-28 DIAGNOSIS — B369 Superficial mycosis, unspecified: Secondary | ICD-10-CM | POA: Diagnosis not present

## 2019-12-28 MED ORDER — CLOTRIMAZOLE 1 % EX CREA: 1.0000 "application " | TOPICAL_CREAM | Freq: Two times a day (BID) | CUTANEOUS | 2 refills | Status: DC

## 2019-12-28 NOTE — Progress Notes (Signed)
Patient ID: Sandra Trujillo, female    DOB: 09/10/1960, 59 y.o.   MRN: [id number]   Chief Complaint  Patient presents with  . Rash    Patient had rash appear in July but went away and has now come back. Rash on pam of both hands and now on feet. Itching, burning and painful. Has tried eucerin lotion, hydrocortisone cream with no improvement. Clobetasol cream helps but eventually comes back.    Subjective:  CC: skin infection palms of hands and feet  Presents today with a complaint of rash on the palms of her hands and the bottom of her feet and on the inner sides of her feet.  Onset of this rash was in July 2021, treated resolved and has now returned.  She has tried multiple treatments: hydrocortisone, clobetasol ointment, and some shampoos that her husband have.  She has received some relief, but it seems to always return.  Associated symptoms include itching, burning, hurts.  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  Pertinent negatives include no fever no chills no chest pain no shortness of breath no abdominal pain no urinary symptoms.    Medical History Sandra Trujillo has a past medical history of GERD (gastroesophageal reflux disease), Hyperlipidemia, and Hypertension.   Outpatient Encounter Medications as of 12/28/2019  Medication Sig  . Cholecalciferol (VITAMIN D) 2000 UNITS CAPS Take 1 capsule by mouth daily.  . clotrimazole (CLOTRIMAZOLE ANTI-FUNGAL) 1 % cream Apply 1 application topically 2 (two) times daily.  Marland Kitchen docusate sodium (COLACE) 100 MG capsule Take 100 mg by mouth daily.  . fluticasone (FLONASE) 50 MCG/ACT nasal spray SHAKE LIQUID AND USE 2 SPRAYS IN EACH NOSTRIL DAILY  . indapamide (LOZOL) 2.5 MG tablet TAKE 1 TABLET(2.5 MG) BY MOUTH DAILY  . losartan (COZAAR) 100 MG tablet 1 qd  . pantoprazole (PROTONIX) 40 MG tablet TAKE 1 TABLET(40 MG) BY MOUTH TWICE DAILY BEFORE A MEAL  . rosuvastatin (CRESTOR) 20 MG tablet Take 1 tablet (20 mg  total) by mouth daily.   No facility-administered encounter medications on file as of 12/28/2019.     Review of Systems  Constitutional: Negative for chills and fever.  Respiratory: Negative for shortness of breath.   Cardiovascular: Negative for chest pain.  Gastrointestinal: Negative for abdominal pain.  Genitourinary: Negative for dysuria.  Skin: Positive for rash.       Palms of both hands, both feet,.     Vitals BP 128/88   Pulse 100   Temp (!) 96.7 F (35.9 C)   Wt 181 lb (82.1 kg)   SpO2 100%   BMI 31.07 kg/m   Objective:   Physical Exam Vitals and nursing note reviewed.  Constitutional:      General: She is not in acute distress.    Appearance: Normal appearance.  Cardiovascular:     Rate and Rhythm: Normal rate and regular rhythm.     Heart sounds: Normal heart sounds.  Pulmonary:     Effort: Pulmonary effort is normal.     Breath sounds: Normal breath sounds.  Skin:    General: Skin is warm and dry.     Findings: Erythema and rash present.  Neurological:     Mental Status: She is alert and oriented to person, place, and time.  Psychiatric:        Mood and Affect: Mood normal.        Behavior: Behavior normal.  Thought Content: Thought content normal.        Judgment: Judgment normal.      Assessment and Plan   1. Fungal skin infection - clotrimazole (CLOTRIMAZOLE ANTI-FUNGAL) 1 % cream; Apply 1 application topically 2 (two) times daily.  Dispense: 113 g; Refill: 2    She has tried all the typical treatments for eczema without relief.  Differentials include fungal skin infection vs. Secondary bacterial infection.   Agrees with plan of care discussed today. Understands warning signs to seek further care: Fever, chills, chest pain, shortness of breath, any significant changes in health status. Understands to follow-up if symptoms do not improve, will consider dermatology referral.  She will follow up in 1 month, will consider antibiotic at  that time, depending on response to topical anti fungal.   Sandra Ades, FNP-C

## 2019-12-28 NOTE — Patient Instructions (Signed)
Athlete's Foot  Athlete's foot (tinea pedis) is a fungal infection of the skin on your feet. It often occurs on the skin that is between or underneath your toes. It can also occur on the soles of your feet. Symptoms include itchy or white and flaky areas on the skin. The infection can spread from person to person (is contagious). It can also spread when a person's bare feet come in contact with the fungus on shower floors or on items such as shoes. Follow these instructions at home: Medicines  Apply or take over-the-counter and prescription medicines only as told by your doctor.  Apply your antifungal medicine as told by your doctor. Do not stop using the medicine even if your feet start to get better. Foot care  Do not scratch your feet.  Keep your feet dry: ? Wear cotton or wool socks. Change your socks every day or if they become wet. ? Wear shoes that allow air to move around, such as sandals or canvas tennis shoes.  Wash and dry your feet: ? Every day or as told by your doctor. ? After exercising. ? Including the area between your toes. General instructions  Do not share any of these items that touch your feet: ? Towels. ? Shoes. ? Nail clippers. ? Other personal items.  Protect your feet by wearing sandals in wet areas, such as locker rooms and shared showers.  Keep all follow-up visits as told by your doctor. This is important.  If you have diabetes, keep your blood sugar under control. Contact a doctor if:  You have a fever.  You have swelling, pain, warmth, or redness in your foot.  Your feet are not getting better with treatment.  Your symptoms get worse.  You have new symptoms. Summary  Athlete's foot is a fungal infection of the skin on your feet.  Symptoms include itchy or white and flaky areas on the skin.  Apply your antifungal medicine as told by your doctor.  Keep your feet clean and dry. This information is not intended to replace advice given  to you by your health care provider. Make sure you discuss any questions you have with your health care provider. Document Revised: 01/29/2017 Document Reviewed: 11/24/2016 Elsevier Patient Education  2020 Elsevier Inc.  

## 2020-01-07 DIAGNOSIS — M9903 Segmental and somatic dysfunction of lumbar region: Secondary | ICD-10-CM | POA: Diagnosis not present

## 2020-01-07 DIAGNOSIS — M9901 Segmental and somatic dysfunction of cervical region: Secondary | ICD-10-CM | POA: Diagnosis not present

## 2020-01-07 DIAGNOSIS — M5412 Radiculopathy, cervical region: Secondary | ICD-10-CM | POA: Diagnosis not present

## 2020-01-07 DIAGNOSIS — M62830 Muscle spasm of back: Secondary | ICD-10-CM | POA: Diagnosis not present

## 2020-01-22 ENCOUNTER — Other Ambulatory Visit: Payer: Self-pay | Admitting: Family Medicine

## 2020-01-23 ENCOUNTER — Other Ambulatory Visit: Payer: Self-pay | Admitting: *Deleted

## 2020-01-23 MED ORDER — PANTOPRAZOLE SODIUM 40 MG PO TBEC: DELAYED_RELEASE_TABLET | ORAL | 0 refills | Status: DC

## 2020-01-23 MED ORDER — ROSUVASTATIN CALCIUM 20 MG PO TABS
20.0000 mg | ORAL_TABLET | Freq: Every day | ORAL | 0 refills | Status: DC
Start: 2020-01-23 — End: 2020-04-30

## 2020-01-27 ENCOUNTER — Ambulatory Visit (INDEPENDENT_AMBULATORY_CARE_PROVIDER_SITE_OTHER): Payer: BC Managed Care – PPO | Admitting: Family Medicine

## 2020-01-27 ENCOUNTER — Encounter: Payer: Self-pay | Admitting: Family Medicine

## 2020-01-27 ENCOUNTER — Other Ambulatory Visit: Payer: Self-pay

## 2020-01-27 DIAGNOSIS — B958 Unspecified staphylococcus as the cause of diseases classified elsewhere: Secondary | ICD-10-CM

## 2020-01-27 DIAGNOSIS — L089 Local infection of the skin and subcutaneous tissue, unspecified: Secondary | ICD-10-CM | POA: Diagnosis not present

## 2020-01-27 MED ORDER — MUPIROCIN CALCIUM 2 % EX CREA: 1.0000 "application " | TOPICAL_CREAM | Freq: Two times a day (BID) | CUTANEOUS | 1 refills | Status: DC

## 2020-01-27 NOTE — Patient Instructions (Signed)
MRSA Infection, Diagnosis, Adult Methicillin-resistant Staphylococcus aureus (MRSA) infection is caused by bacteria called Staphylococcus aureus, or staph, that no longer respond to common antibiotic medicines (drug-resistant bacteria). MRSA infection can be hard to treat. Most of the time, MRSA can be on the skin or in the nose without causing problems (colonized). However, if MRSA enters the body through a cut, a sore, or an invasive medical device, it can cause a serious infection. What are the causes? This condition is caused by staph bacteria. Illness may develop after exposure to the bacteria through:  Skin-to-skin contact with someone who is infected with MRSA.  Touching surfaces that have the bacteria on them.  Having a procedure or using equipment that allows MRSA to enter the body.  Having MRSA that lives on your skin and then enters your body through: ? A cut or scratch. ? A surgery or procedure. ? The use of a medical device. Contact with the bacteria may occur:  During a stay in a hospital, rehabilitation facility, nursing home, or other health care facility (health care-associated MRSA).  In daily activities where there is close contact with others, such as sports, child care centers, or at home (community-associated MRSA). What increases the risk? You are more likely to develop this condition if you:  Have a surgery or procedure.  Have an IV or a thin tube (catheter) placed in your body.  Are elderly.  Are on kidney dialysis.  Have recently taken an antibiotic medicine.  Live in a long-term care facility.  Have a chronic wound or skin ulcer.  Have a weak body defense system (immune system).  Play sports that involve skin-to-skin contact.  Live in a crowded place, like a dormitory or [employer]  Share towels, razors, or sports equipment with other people.  Have a history of MRSA infection or colonization. What are the signs or symptoms? Symptoms  of this condition depend on the area that is affected. Symptoms may include:  A pus-filled pimple or boil.  Pus that drains from your skin.  A sore (abscess) under your skin or somewhere in your body.  Fever with or without chills.  Difficulty breathing.  Coughing up blood.  Redness, warmth, swelling, or pain in the affected area. How is this diagnosed? This condition may be diagnosed based on:  A physical exam.  Your medical history.  Taking a sample from the infected area and growing it in a lab (culture). You may also have other tests, including:  Imaging tests, such as X-rays, a CT scan, or an MRI.  Lab tests, such as blood, urine, or phlegm (sputum) tests. You skin or nose may be swabbed when you are admitted to a health care facility for a procedure. This is to screen for MRSA. How is this treated? Treatment depends on the type of MRSA infection you have and how severe, deep, or extensive it is. Treatment may include:  Antibiotic medicines.  Surgery to drain pus from the infected area. Severe infections may require a hospital stay. Follow these instructions at home: Medicines  Take over-the-counter and prescription medicines only as told by your health care provider.  If you were prescribed an antibiotic medicine, use it as told by your health care provider. Do not stop using the antibiotic even if you start to feel better. Prevention Follow these instructions to avoid spreading the infection to others:  Wash your hands frequently with soap and water. If soap and water are not available, use an alcohol-based hand sanitizer.  Avoid close contact with those around you as much as possible. Do not use towels, razors, toothbrushes, bedding, or other items that will be used by others.  Wash towels, bedding, and clothes in the washing machine with detergent and hot water. Dry them in a hot dryer.  Clean surfaces regularly to remove germs (disinfection). Use products  or solutions that contain bleach. Make sure you disinfect bathroom surfaces, food preparation areas, exercise equipment, and doorknobs.  General instructions  If you have a wound, follow instructions from your health care provider about how to take care of your wound. ? Do not pick at scabs. ? Do not try to drain any infection sites or pimples.  Tell all your health care providers that you have MRSA, or if you have ever had a MRSA infection.  Keep all follow-up visits as told by your health care provider. This is important. Contact a health care provider if you:  Do not get better.  Have symptoms that get worse.  Have new symptoms. Get help right away if you have:  Nausea or vomiting, or if you cannot take medicine without vomiting.  Trouble breathing.  Chest pain. These symptoms may represent a serious problem that is an emergency. Do not wait to see if the symptoms will go away. Get medical help right away. Call your local emergency services (911 in the [place] Do not drive yourself to the hospital. Summary  MRSA infection is caused by bacteria called Staphylococcus aureus, or staph, that no longer respond to common antibiotic medicines.  Treatment for this condition depends on the type of MRSA infection you have and how severe, deep, and extensive it is.  If you were prescribed an antibiotic medicine, use it as told by your health care provider. Do not stop using the antibiotic even if you start to feel better.  Follow instructions from your health care provider to avoid spreading the infection to others. This information is not intended to replace advice given to you by your health care provider. Make sure you discuss any questions you have with your health care provider. Document Revised: 04/22/2018 Document Reviewed: 04/23/2018 Elsevier Patient Education  [place].

## 2020-01-27 NOTE — Progress Notes (Signed)
Patient ID: Sandra Trujillo, female    DOB: August 14, 1960, 59 y.o.   MRN: [id number]   Chief Complaint  Patient presents with  . Follow-up    Patient following up on rash on palms and bottom of feet. Patient reports improvement in symptoms since starting the clotrimazole cream. Rash has still spread a little bit and is very itchy but she can tell it is better, still using cream twice per day.    Subjective:  CC: follow-up fungal skin infection  This is not a new problem.  Here to follow-up on fungal skin infection.  Initially seen on November 10, prescribed antifungal cream.  Reports somewhat better, not completely resolved appears to have new areas on the left wrist.  Denies fever, chills, chest pain, shortness of breath.  Reports there is no pustular areas, no drainage.  She is a Marine scientist, uses gloves, probably washes her hands frequently throughout the day, denies any new products at work.    Medical History Sandra Trujillo has a past medical history of GERD (gastroesophageal reflux disease), Hyperlipidemia, and Hypertension.   Outpatient Encounter Medications as of 01/27/2020  Medication Sig  . Cholecalciferol (VITAMIN D) 2000 UNITS CAPS Take 1 capsule by mouth daily.  . clotrimazole (CLOTRIMAZOLE ANTI-FUNGAL) 1 % cream Apply 1 application topically 2 (two) times daily.  Marland Kitchen docusate sodium (COLACE) 100 MG capsule Take 100 mg by mouth daily.  . fluticasone (FLONASE) 50 MCG/ACT nasal spray SHAKE LIQUID AND USE 2 SPRAYS IN EACH NOSTRIL DAILY  . indapamide (LOZOL) 2.5 MG tablet TAKE 1 TABLET(2.5 MG) BY MOUTH DAILY  . losartan (COZAAR) 100 MG tablet 1 qd  . mupirocin cream (BACTROBAN) 2 % Apply 1 application topically 2 (two) times daily.  . pantoprazole (PROTONIX) 40 MG tablet TAKE 1 TABLET(40 MG) BY MOUTH TWICE DAILY BEFORE A MEAL  . rosuvastatin (CRESTOR) 20 MG tablet Take 1 tablet (20 mg total) by mouth daily.   No facility-administered encounter medications on file as of 01/27/2020.      Review of Systems  Constitutional: Negative for chills and fever.  Respiratory: Negative for shortness of breath.   Cardiovascular: Negative for chest pain.  Skin: Positive for rash.     Vitals BP 128/86   Pulse (!) 103   Temp (!) 96.8 F (36 C)   Wt 179 lb 9.6 oz (81.5 kg)   SpO2 99%   BMI 30.83 kg/m   Objective:   Physical Exam Vitals and nursing note reviewed.  Constitutional:      Appearance: Normal appearance.  Cardiovascular:     Rate and Rhythm: Normal rate and regular rhythm.     Heart sounds: Normal heart sounds.  Pulmonary:     Effort: Pulmonary effort is normal.     Breath sounds: Normal breath sounds.  Skin:    General: Skin is warm and dry.     Findings: Rash present.     Comments: Bilateral palms of hand and wrist.  Neurological:     General: No focal deficit present.     Mental Status: She is alert.  Psychiatric:        Behavior: Behavior normal.      Assessment and Plan   1. Staph skin infection - mupirocin cream (BACTROBAN) 2 %; Apply 1 application topically 2 (two) times daily.  Dispense: 15 g; Refill: 1 - Ambulatory referral to Dermatology   Rash somewhat improved, not completely resolved. Appears to have 'new" area on left wrist. Concerned this is not fungal infection.  Will treat for staph skin infection while await referral to derm. 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  Agrees with plan of care discussed today. Understands warning signs to seek further care: chest pain, shortness of breath, any significant change in health. Understands to follow-up with derm, here if needed.   Pecolia Ades, FNP-C

## 2020-02-03 LAB — PTH, INTACT AND CALCIUM
Calcium: 11.2 mg/dL — ABNORMAL HIGH (ref 8.7–10.2)
PTH: 36 pg/mL (ref 15–65)

## 2020-02-04 DIAGNOSIS — M5412 Radiculopathy, cervical region: Secondary | ICD-10-CM | POA: Diagnosis not present

## 2020-02-04 DIAGNOSIS — M62830 Muscle spasm of back: Secondary | ICD-10-CM | POA: Diagnosis not present

## 2020-02-04 DIAGNOSIS — M9903 Segmental and somatic dysfunction of lumbar region: Secondary | ICD-10-CM | POA: Diagnosis not present

## 2020-02-04 DIAGNOSIS — M9901 Segmental and somatic dysfunction of cervical region: Secondary | ICD-10-CM | POA: Diagnosis not present

## 2020-02-15 ENCOUNTER — Telehealth: Payer: Self-pay

## 2020-02-15 NOTE — Telephone Encounter (Signed)
Called pt, left VM, patient needs to complete the other 2 labs.

## 2020-02-15 NOTE — Telephone Encounter (Signed)
Yes she needs them as well

## 2020-02-15 NOTE — Telephone Encounter (Signed)
I see this patient did labs on 12/17, did she need to still do the other 2?

## 2020-02-20 DIAGNOSIS — Z23 Encounter for immunization: Secondary | ICD-10-CM | POA: Diagnosis not present

## 2020-02-21 LAB — CALCIUM, URINE, 24 HOUR
Calcium, 24H Urine: 107 mg/24 hr (ref 0–320)
Calcium, Urine: 10.2 mg/dL

## 2020-02-21 LAB — CREATININE, URINE, 24 HOUR
Creatinine, 24H Ur: 1465 mg/24 hr (ref 800–1800)
Creatinine, Urine: 139.5 mg/dL

## 2020-02-23 ENCOUNTER — Ambulatory Visit: Payer: BC Managed Care – PPO | Admitting: "Endocrinology

## 2020-02-23 ENCOUNTER — Encounter: Payer: Self-pay | Admitting: "Endocrinology

## 2020-02-23 ENCOUNTER — Other Ambulatory Visit: Payer: Self-pay

## 2020-02-23 DIAGNOSIS — E210 Primary hyperparathyroidism: Secondary | ICD-10-CM | POA: Insufficient documentation

## 2020-02-23 NOTE — Progress Notes (Signed)
02/23/2020, 5:17 PM   Endocrinology follow-up note  Sandra Trujillo is a 60 y.o.-year-old female, referred by her  Babs Sciara, MD  . -She is here for follow-up after she was seen in consultation for evaluation for hypercalcemia/hyperparathyroidism.   Past Medical History:  Diagnosis Date  . GERD (gastroesophageal reflux disease)   . Hyperlipidemia   . Hypertension     Past Surgical History:  Procedure Laterality Date  . CERVICAL DISCECTOMY    . COLONOSCOPY N/A 09/15/2014   SLF: 1. submucosal lesion in the hepatic flexure likely a lipoma 2 . mild diverticulosis in teh sigmoid colon and descending colon 3. the left colon is redundant  . COLONOSCOPY WITH ESOPHAGOGASTRODUODENOSCOPY (EGD)  12/2002   TIW:PYKDX HH/mildchanges of reflux/otherwise normal. normal colonoscopy and terminal ileoscopy.  . ESOPHAGEAL DILATION N/A 04/02/2015   Procedure: ESOPHAGEAL DILATION;  Surgeon: West Bali, MD;  [facility];  Service: Endoscopy;  Laterality: N/A;  . ESOPHAGOGASTRODUODENOSCOPY N/A 09/15/2014   SLF: 1. Schatzki ring at the gastroesophagel junction 2. moderate erosive gastritis/duoentitit and three small gastric ulcers due to ASA/ibuprofen  . ESOPHAGOGASTRODUODENOSCOPY N/A 04/02/2015   Procedure: ESOPHAGOGASTRODUODENOSCOPY (EGD);  Surgeon: West Bali, MD;  [facility];  Service: Endoscopy;  Laterality: N/A;  1315 - moved to 2/13 @ 2:00  . SAVORY DILATION N/A 09/15/2014   Procedure: SAVORY DILATION;  Surgeon: West Bali, MD;  [facility];  Service: Endoscopy;  Laterality: N/A;    Social History   Tobacco Use  . Smoking status: Current Every Day Smoker    Packs/day: 1.00    Years: 25.00    Pack years: 25.00    Types: Cigarettes  . Smokeless tobacco: Never Used  Vaping Use  . Vaping Use: Never used  Substance Use Topics  . Alcohol use: No    Alcohol/week: 0.0 standard drinks  .  Drug use: No    Family History  Problem Relation Age of Onset  . Hypertension Father   . Heart failure Mother   . Stroke Mother   . Hypertension Mother   . Hyperlipidemia Mother   . Colon cancer Neg Hx   . Inflammatory bowel disease Neg Hx   . Liver disease Neg Hx     Outpatient Encounter Medications as of 02/23/2020  Medication Sig  . Cholecalciferol (VITAMIN D) 2000 UNITS CAPS Take 1 capsule by mouth daily.  . clotrimazole (CLOTRIMAZOLE ANTI-FUNGAL) 1 % cream Apply 1 application topically 2 (two) times daily.  Marland Kitchen docusate sodium (COLACE) 100 MG capsule Take 100 mg by mouth daily.  . fluticasone (FLONASE) 50 MCG/ACT nasal spray SHAKE LIQUID AND USE 2 SPRAYS IN EACH NOSTRIL DAILY  . indapamide (LOZOL) 2.5 MG tablet TAKE 1 TABLET(2.5 MG) BY MOUTH DAILY  . losartan (COZAAR) 100 MG tablet 1 qd  . pantoprazole (PROTONIX) 40 MG tablet TAKE 1 TABLET(40 MG) BY MOUTH TWICE DAILY BEFORE A MEAL  . rosuvastatin (CRESTOR) 20 MG tablet Take 1 tablet (20 mg total) by mouth daily.  . [DISCONTINUED] mupirocin cream (BACTROBAN) 2 % Apply 1 application topically 2 (two) times daily.   No facility-administered encounter medications  on file as of 02/23/2020.    Allergies  Allergen Reactions  . Sulfa Antibiotics Other (See Comments)    "Flu like symptoms 10 times worse".  . Lisinopril Cough     HPI  Sandra Trujillo was first diagnosed with hypercalcemia in 2019.  Patient has no previously known history of parathyroid, pituitary, adrenal dysfunctions; no family history of such dysfunctions.  She was never offered treatment for hypercalcemia. -Her previsit labs show calcium of 11.2 compared to her last visit calcium of 11.1, associated with lower PTH of 36 compared to last visit PTH of 65.   -Her bone density is normal.  Her 24-hour urine calcium was 107, 31 during last measurement. No prior history of fragility fractures or falls. No history of  kidney stones.  No history of CKD.   she is not on  HCTZ or other thiazide therapy.  She has history of vitamin D deficiency, currently on vitamin D supplement 2000 units daily.  No reported change. she is not on calcium supplements,  she eats dairy and green, leafy, vegetables on average amounts.  she does not have a family history of hypercalcemia, pituitary tumors, thyroid cancer, or osteoporosis.   I reviewed her chart and she also has a history of hypertension, hyperlipidemia on treatment.    ROS: Dated as above. PE: BP (!) 142/88 (BP Location: Left Arm, Patient Position: Sitting)   Pulse 100   Ht 5\' 4"  (1.626 m)   Wt 175 lb 6.4 oz (79.6 kg)   BMI 30.11 kg/m , Body mass index is 30.11 kg/m. Wt Readings from Last 3 Encounters:  02/23/20 175 lb 6.4 oz (79.6 kg)  01/27/20 179 lb 9.6 oz (81.5 kg)  12/28/19 181 lb (82.1 kg)     Physical Exam- Limited  Constitutional:  Body mass index is 30.11 kg/m. , not in acute distress, normal state of mind Eyes:  EOMI, no exophthalmos Neck: Supple Thyroid: No gross goiter Respiratory: Adequate breathing efforts Musculoskeletal: no gross deformities, strength intact in all four extremities, no gross restriction of joint movements Skin:  no rashes, no hyperemia Neurological: no tremor with outstretched hands      CMP ( most recent) CMP     Component Value Date/Time   NA 140 07/20/2019 1024   NA 140 01/10/2013 1120   K 4.4 07/20/2019 1024   K 4.2 01/10/2013 1120   CL 100 07/20/2019 1024   CL 109 (H) 01/10/2013 1120   CO2 26 07/20/2019 1024   CO2 25 01/10/2013 1120   GLUCOSE 96 07/20/2019 1024   GLUCOSE 89 01/10/2013 1120   BUN 19 07/20/2019 1024   BUN 9 01/10/2013 1120   CREATININE 1.25 (H) 07/20/2019 1024   CREATININE 1.07 01/10/2013 1120   CREATININE 1.19 (H) 12/30/2012 0914   CALCIUM 11.2 (H) 02/02/2020 0820   CALCIUM 9.0 01/10/2013 1120   PROT 6.7 10/25/2019 0809   ALBUMIN 4.5 10/25/2019 0809   AST 17 10/25/2019 0809   ALT 18 10/25/2019 0809   ALKPHOS 101 10/25/2019  0809   BILITOT 0.4 10/25/2019 0809   GFRNONAA 48 (L) 07/20/2019 1024   GFRNONAA >60 01/10/2013 1120   GFRAA 56 (L) 07/20/2019 1024   GFRAA >60 01/10/2013 1120     Diabetic Labs (most recent): Lab Results  Component Value Date   HGBA1C (H) 06/02/2009    5.9 (NOTE)  According to the ADA Clinical Practice Recommendations for 2011, when HbA1c is used as a screening test:   >=6.5%   Diagnostic of Diabetes Mellitus           (if abnormal result  is confirmed)  5.7-6.4%   Increased risk of developing Diabetes Mellitus  References:Diagnosis and Classification of Diabetes Mellitus,Diabetes [id number] 1):S62-S69 and Standards of Medical Care in         Diabetes - 2011,Diabetes [id number]  (Suppl 1):S11-S61.     Lipid Panel ( most recent) Lipid Panel     Component Value Date/Time   CHOL 183 10/25/2019 0809   TRIG 122 10/25/2019 0809   HDL 45 10/25/2019 0809   CHOLHDL 4.1 10/25/2019 0809   CHOLHDL 3.7 12/30/2012 0914   VLDL 17 12/30/2012 0914   LDLCALC 116 (H) 10/25/2019 0809   LABVLDL 22 10/25/2019 0809     Recent Results (from the past 2160 hour(s))  PTH, intact and calcium     Status: Abnormal   Collection Time: 02/02/20  8:20 AM  Result Value Ref Range   Calcium 11.2 (H) 8.7 - 10.2 mg/dL   PTH 36 15 - 65 pg/mL   PTH Interp Comment     Comment: Interpretation                 Intact PTH    Calcium                                 (pg/mL)      (mg/dL) Normal                          15 - 65     8.6 - 10.2 Primary Hyperparathyroidism         >65          >10.2 Secondary Hyperparathyroidism       >65          <10.2 Non-Parathyroid Hypercalcemia       <65          >10.2 Hypoparathyroidism                  <15          < 8.6 Non-Parathyroid Hypocalcemia    15 - 65          < 8.6   Calcium, urine, 24 hour     Status: None   Collection Time: 02/20/20  8:30 AM  Result Value Ref Range   Calcium, Urine 10.2  Not Estab. mg/dL   Calcium, 24H Urine 107 0 - 320 mg/24 hr  Creatinine, urine, 24 hour     Status: None   Collection Time: 02/20/20  8:30 AM  Result Value Ref Range   Creatinine, Urine 139.5 Not Estab. mg/dL   Creatinine, 24H Ur 1,465 800 - 1,800 mg/24 hr      Assessment: 1. Hypercalcemia / Hyperparathyroidism  Plan: Patient has had several instances of elevated calcium, with the highest level being at 11.2 mg per DL associated with higher PTH of 65.  Her recent labs show PTH of 36.   - Patient also  has vitamin D deficiency-currently on vitamin D supplement.  -Her repeat 24-hour urine calcium measurement reveals 107, higher than last measurement at 31. -Her work-up is consistent with hypercalcemia due to primary hyperparathyroidism. - I discussed with the patient about the physiology of calcium and  parathyroid hormone, and possible  effects of  increased PTH/ Calcium , including kidney stones, cardiac dysrhythmias, osteoporosis, abdominal pain, etc.  - No apparent complications from hypercalcemia/hyperparathyroidism: no history of  nephrolithiasis,  osteoporosis,fragility fractures. No abdominal pain, no major mood disorders, no bone pain.  -She is a surgical candidate.  However, she would be continued on observation status until next measurement in 6 months.    She is advised to continue follow-up with her PMD Dr. Sallee Lange.     - Time spent on this patient care encounter:  20 minutes of which 50% was spent in  counseling and the rest reviewing  her current and  previous labs / studies and medications  doses and developing a plan for long term care. Victorino Sparrow Sulser  participated in the discussions, expressed understanding, and voiced agreement with the above plans.  All questions were answered to her satisfaction. she is encouraged to contact clinic should she have any questions or concerns prior to her return visit.  - Return in about 6 months (around 08/22/2020) for F/U with  Pre-visit Labs.   Glade Lloyd, MD [facility] [address] [place], [place] [address] Phone: [phone]  Fax: [phone]    This note was partially dictated with voice recognition software. Similar sounding words can be transcribed inadequately or may not  be corrected upon review.  02/23/2020, 5:17 PM

## 2020-02-25 DIAGNOSIS — M5412 Radiculopathy, cervical region: Secondary | ICD-10-CM | POA: Diagnosis not present

## 2020-02-25 DIAGNOSIS — M62830 Muscle spasm of back: Secondary | ICD-10-CM | POA: Diagnosis not present

## 2020-02-25 DIAGNOSIS — M9903 Segmental and somatic dysfunction of lumbar region: Secondary | ICD-10-CM | POA: Diagnosis not present

## 2020-02-25 DIAGNOSIS — M9901 Segmental and somatic dysfunction of cervical region: Secondary | ICD-10-CM | POA: Diagnosis not present

## 2020-03-06 ENCOUNTER — Encounter: Payer: Self-pay | Admitting: Family Medicine

## 2020-03-09 NOTE — Telephone Encounter (Signed)
Nurses Although I prescribed the blood pressure medicine I have not seen this rash recently It is hard to know if this rash is dyshidrotic eczema Or other causes including due to medication  Most drug related rashes are systemic meaning that they are all over the body not just hands and feet  I will be happy to see Shailene for a office visit to look at this rash and give her my opinion on whether or not the medication could be causing this  It was also recommended by nurse practitioner that the patient be seen by dermatology to get their opinion that seems reasonable.  Please communicate with Dealie we will be happy to see her back to look at this rash on my schedule thank you Thanks-Dr. Nicki Reaper

## 2020-03-24 DIAGNOSIS — M9903 Segmental and somatic dysfunction of lumbar region: Secondary | ICD-10-CM | POA: Diagnosis not present

## 2020-03-24 DIAGNOSIS — M5412 Radiculopathy, cervical region: Secondary | ICD-10-CM | POA: Diagnosis not present

## 2020-03-24 DIAGNOSIS — M9901 Segmental and somatic dysfunction of cervical region: Secondary | ICD-10-CM | POA: Diagnosis not present

## 2020-03-24 DIAGNOSIS — M62830 Muscle spasm of back: Secondary | ICD-10-CM | POA: Diagnosis not present

## 2020-04-04 ENCOUNTER — Ambulatory Visit (INDEPENDENT_AMBULATORY_CARE_PROVIDER_SITE_OTHER): Payer: BC Managed Care – PPO | Admitting: Family Medicine

## 2020-04-04 DIAGNOSIS — R059 Cough, unspecified: Secondary | ICD-10-CM | POA: Diagnosis not present

## 2020-04-04 DIAGNOSIS — J019 Acute sinusitis, unspecified: Secondary | ICD-10-CM

## 2020-04-04 MED ORDER — AMOXICILLIN-POT CLAVULANATE 875-125 MG PO TABS: 1.0000 | ORAL_TABLET | Freq: Two times a day (BID) | ORAL | 0 refills | Status: AC

## 2020-04-04 NOTE — Progress Notes (Signed)
   Subjective:    Patient ID: Sandra Trujillo, female    DOB: 19-Sep-1960, 60 y.o.   MRN: [id number]  Cough This is a new problem. The current episode started in the past 7 days. Associated symptoms include nasal congestion.   Patient with head congestion drainage coughing present for the past 10 days denies high fever chills sweats wheezing difficulty breathing    Review of Systems  Respiratory: Positive for cough.   Please see above     Objective:   Physical Exam Mild sinus tenderness eardrums normal throat normal neck supple lungs clear       Assessment & Plan:  Covid test taken per protocol Antibiotics for sinus infection Follow-up if progressive troubles or worse Warning signs discussed

## 2020-04-05 ENCOUNTER — Other Ambulatory Visit: Payer: Self-pay | Admitting: Family Medicine

## 2020-04-05 ENCOUNTER — Encounter: Payer: Self-pay | Admitting: Family Medicine

## 2020-04-05 MED ORDER — HYDROCODONE-HOMATROPINE 5-1.5 MG/5ML PO SYRP: 5.0000 mL | ORAL_SOLUTION | Freq: Four times a day (QID) | ORAL | 0 refills | Status: AC | PRN

## 2020-04-06 LAB — SPECIMEN STATUS REPORT

## 2020-04-06 LAB — NOVEL CORONAVIRUS, NAA: SARS-CoV-2, NAA: NOT DETECTED

## 2020-04-06 LAB — SARS-COV-2, NAA 2 DAY TAT

## 2020-04-17 ENCOUNTER — Other Ambulatory Visit: Payer: Self-pay

## 2020-04-17 ENCOUNTER — Ambulatory Visit
Admission: EM | Admit: 2020-04-17 | Discharge: 2020-04-17 | Disposition: A | Discharge status: Home / Self Care | Payer: BC Managed Care – PPO | Attending: Emergency Medicine | Admitting: Emergency Medicine

## 2020-04-17 DIAGNOSIS — R6889 Other general symptoms and signs: Secondary | ICD-10-CM

## 2020-04-17 DIAGNOSIS — Z20822 Contact with and (suspected) exposure to covid-19: Secondary | ICD-10-CM | POA: Diagnosis not present

## 2020-04-17 MED ORDER — ONDANSETRON HCL 4 MG PO TABS: 4.0000 mg | ORAL_TABLET | Freq: Four times a day (QID) | ORAL | 0 refills | Status: DC

## 2020-04-17 NOTE — ED Triage Notes (Signed)
Headache, abd pain, lower back pain, chills since yesterday.  diarrhea, nauseated.

## 2020-04-17 NOTE — Discharge Instructions (Signed)
COVID testing ordered.  It will take between 5-7 days for test results.  Someone will contact you regarding abnormal results.    In the meantime: You should remain isolated in your home for 10 days from symptom onset AND greater than 72 hours after symptoms resolution (absence of fever without the use of fever-reducing medication and improvement in respiratory symptoms), whichever is longer Get plenty of rest and push fluids Zofran prescribed for nausea Use OTC zyrtec for nasal congestion, runny nose, and/or sore throat Use OTC flonase for nasal congestion and runny nose Use medications daily for symptom relief Use OTC medications like ibuprofen or tylenol as needed fever or pain Call or go to the ED if you have any new or worsening symptoms such as fever, cough, shortness of breath, chest tightness, chest pain, turning blue, changes in mental status, etc..Marland Kitchen

## 2020-04-17 NOTE — ED Provider Notes (Signed)
[place]   [id number] 04/17/20 Arrival Time: 34   CC: COVID symptoms  SUBJECTIVE: History from: patient.  Sandra Trujillo is a 60 y.o. female who presents with headache, abdominal discomfort, nausea, low back pain, chills, and diarrhea x 1 day.  Denies sick exposure to COVID, flu or strep.  Works at a Teacher, music.  Has tried OTC medication without relief.  Reports previous symptoms in the past.   Denies fever, chills, sinus pain, rhinorrhea, sore throat, SOB, wheezing, chest pain, changes in bladder habits.    ROS: As per HPI.  All other pertinent ROS negative.     Past Medical History:  Diagnosis Date  . GERD (gastroesophageal reflux disease)   . Hyperlipidemia   . Hypertension    Past Surgical History:  Procedure Laterality Date  . CERVICAL DISCECTOMY    . COLONOSCOPY N/A 09/15/2014   SLF: 1. submucosal lesion in the hepatic flexure likely a lipoma 2 . mild diverticulosis in teh sigmoid colon and descending colon 3. the left colon is redundant  . COLONOSCOPY WITH ESOPHAGOGASTRODUODENOSCOPY (EGD)  12/2002   YBO:FBPZW HH/mildchanges of reflux/otherwise normal. normal colonoscopy and terminal ileoscopy.  . ESOPHAGEAL DILATION N/A 04/02/2015   Procedure: ESOPHAGEAL DILATION;  Surgeon: Danie Binder, MD;  [facility];  Service: Endoscopy;  Laterality: N/A;  . ESOPHAGOGASTRODUODENOSCOPY N/A 09/15/2014   SLF: 1. Schatzki ring at the gastroesophagel junction 2. moderate erosive gastritis/duoentitit and three small gastric ulcers due to ASA/ibuprofen  . ESOPHAGOGASTRODUODENOSCOPY N/A 04/02/2015   Procedure: ESOPHAGOGASTRODUODENOSCOPY (EGD);  Surgeon: Danie Binder, MD;  [facility];  Service: Endoscopy;  Laterality: N/A;  1315 - moved to 2/13 @ 2:00  . SAVORY DILATION N/A 09/15/2014   Procedure: SAVORY DILATION;  Surgeon: Danie Binder, MD;  [facility];  Service: Endoscopy;  Laterality: N/A;   Allergies  Allergen Reactions  . Sulfa  Antibiotics Other (See Comments)    "Flu like symptoms 10 times worse".  . Lisinopril Cough   No current facility-administered medications on file prior to encounter.   Current Outpatient Medications on File Prior to Encounter  Medication Sig Dispense Refill  . Cholecalciferol (VITAMIN D) 2000 UNITS CAPS Take 1 capsule by mouth daily.    . clotrimazole (CLOTRIMAZOLE ANTI-FUNGAL) 1 % cream Apply 1 application topically 2 (two) times daily. 113 g 2  . docusate sodium (COLACE) 100 MG capsule Take 100 mg by mouth daily.    . fluticasone (FLONASE) 50 MCG/ACT nasal spray SHAKE LIQUID AND USE 2 SPRAYS IN EACH NOSTRIL DAILY 48 g 1  . indapamide (LOZOL) 2.5 MG tablet TAKE 1 TABLET(2.5 MG) BY MOUTH DAILY 90 tablet 0  . losartan (COZAAR) 100 MG tablet 1 qd 90 tablet 1  . pantoprazole (PROTONIX) 40 MG tablet TAKE 1 TABLET(40 MG) BY MOUTH TWICE DAILY BEFORE A MEAL 180 tablet 0  . rosuvastatin (CRESTOR) 20 MG tablet Take 1 tablet (20 mg total) by mouth daily. 30 tablet 0   Social History   Socioeconomic History  . Marital status: Married    Spouse name: Not on file  . Number of children: 1  . Years of education: Not on file  . Highest education level: Not on file  Occupational History  . Occupation: [employer]  Tobacco Use  . Smoking status: Current Every Day Smoker    Packs/day: 1.00    Years: 25.00    Pack years: 25.00    Types: Cigarettes  . Smokeless tobacco: Never Used  Vaping Use  . Vaping Use: Never used  Substance and Sexual Activity  . Alcohol use: No    Alcohol/week: 0.0 standard drinks  . Drug use: No  . Sexual activity: Not on file  Other Topics Concern  . Not on file  Social History Narrative  . Not on file   Social Determinants of Health   Financial Resource Strain: Not on file  Food Insecurity: Not on file  Transportation Needs: Not on file  Physical Activity: Not on file  Stress: Not on file  Social Connections: Not on file  Intimate Partner Violence: Not on file    Family History  Problem Relation Age of Onset  . Hypertension Father   . Heart failure Mother   . Stroke Mother   . Hypertension Mother   . Hyperlipidemia Mother   . Colon cancer Neg Hx   . Inflammatory bowel disease Neg Hx   . Liver disease Neg Hx     OBJECTIVE:  Vitals:   04/17/20 1611  BP: (!) 154/98  Pulse: (!) 104  Resp: 18  Temp: 97.9 F (36.6 C)  TempSrc: Oral  SpO2: 95%    General appearance: alert; appears fatigued, but nontoxic; speaking in full sentences and tolerating own secretions HEENT: NCAT; Ears: EACs clear; Eyes: PERRL.  EOM grossly intact. Nose: nares patent without rhinorrhea, Throat: oropharynx clear, tonsils non erythematous or enlarged, uvula midline  Neck: supple without LAD Lungs: unlabored respirations, symmetrical air entry; cough: absent; no respiratory distress; CTAB Heart: regular rate and rhythm.   Abdomen: soft, nondistended, normal active bowel sounds; nontender to palpation; no guarding  Skin: warm and dry Psychological: alert and cooperative; normal mood and affect  ASSESSMENT & PLAN:  1. Exposure to COVID-19 virus   2. Flu-like symptoms     Meds ordered this encounter  Medications  . ondansetron (ZOFRAN) 4 MG tablet    Sig: Take 1 tablet (4 mg total) by mouth every 6 (six) hours.    Dispense:  12 tablet    Refill:  0    Order Specific Question:   Supervising Provider    Answer:   Raylene Everts [[id number]]   COVID testing ordered.  It will take between 5-7 days for test results.  Someone will contact you regarding abnormal results.    In the meantime: You should remain isolated in your home for 10 days from symptom onset AND greater than 72 hours after symptoms resolution (absence of fever without the use of fever-reducing medication and improvement in respiratory symptoms), whichever is longer Get plenty of rest and push fluids Zofran prescribed for nausea Use OTC zyrtec for nasal congestion, runny nose, and/or sore  throat Use OTC flonase for nasal congestion and runny nose Use medications daily for symptom relief Use OTC medications like ibuprofen or tylenol as needed fever or pain Call or go to the ED if you have any new or worsening symptoms such as fever, cough, shortness of breath, chest tightness, chest pain, turning blue, changes in mental status, etc...   Reviewed expectations re: course of current medical issues. Questions answered. Outlined signs and symptoms indicating need for more acute intervention. Patient verbalized understanding. After Visit Summary given.         Lestine Box, PA-C 04/17/20 1709

## 2020-04-18 LAB — COVID-19, FLU A+B NAA
Influenza A, NAA: NOT DETECTED
Influenza B, NAA: NOT DETECTED
SARS-CoV-2, NAA: NOT DETECTED

## 2020-04-21 DIAGNOSIS — M5412 Radiculopathy, cervical region: Secondary | ICD-10-CM | POA: Diagnosis not present

## 2020-04-21 DIAGNOSIS — M9903 Segmental and somatic dysfunction of lumbar region: Secondary | ICD-10-CM | POA: Diagnosis not present

## 2020-04-21 DIAGNOSIS — M62830 Muscle spasm of back: Secondary | ICD-10-CM | POA: Diagnosis not present

## 2020-04-21 DIAGNOSIS — M9901 Segmental and somatic dysfunction of cervical region: Secondary | ICD-10-CM | POA: Diagnosis not present

## 2020-04-29 ENCOUNTER — Other Ambulatory Visit: Payer: Self-pay | Admitting: Family Medicine

## 2020-05-19 DIAGNOSIS — M9903 Segmental and somatic dysfunction of lumbar region: Secondary | ICD-10-CM | POA: Diagnosis not present

## 2020-05-19 DIAGNOSIS — M5412 Radiculopathy, cervical region: Secondary | ICD-10-CM | POA: Diagnosis not present

## 2020-05-19 DIAGNOSIS — M9901 Segmental and somatic dysfunction of cervical region: Secondary | ICD-10-CM | POA: Diagnosis not present

## 2020-05-19 DIAGNOSIS — M62830 Muscle spasm of back: Secondary | ICD-10-CM | POA: Diagnosis not present

## 2020-06-23 DIAGNOSIS — M9903 Segmental and somatic dysfunction of lumbar region: Secondary | ICD-10-CM | POA: Diagnosis not present

## 2020-06-23 DIAGNOSIS — M9901 Segmental and somatic dysfunction of cervical region: Secondary | ICD-10-CM | POA: Diagnosis not present

## 2020-06-23 DIAGNOSIS — M5412 Radiculopathy, cervical region: Secondary | ICD-10-CM | POA: Diagnosis not present

## 2020-06-23 DIAGNOSIS — M62830 Muscle spasm of back: Secondary | ICD-10-CM | POA: Diagnosis not present

## 2020-07-21 DIAGNOSIS — M5412 Radiculopathy, cervical region: Secondary | ICD-10-CM | POA: Diagnosis not present

## 2020-07-21 DIAGNOSIS — M9901 Segmental and somatic dysfunction of cervical region: Secondary | ICD-10-CM | POA: Diagnosis not present

## 2020-07-21 DIAGNOSIS — M9903 Segmental and somatic dysfunction of lumbar region: Secondary | ICD-10-CM | POA: Diagnosis not present

## 2020-07-21 DIAGNOSIS — M62830 Muscle spasm of back: Secondary | ICD-10-CM | POA: Diagnosis not present

## 2020-07-21 DIAGNOSIS — M955 Acquired deformity of pelvis: Secondary | ICD-10-CM | POA: Diagnosis not present

## 2020-07-21 DIAGNOSIS — M9905 Segmental and somatic dysfunction of pelvic region: Secondary | ICD-10-CM | POA: Diagnosis not present

## 2020-07-24 DIAGNOSIS — B351 Tinea unguium: Secondary | ICD-10-CM | POA: Diagnosis not present

## 2020-07-24 DIAGNOSIS — B353 Tinea pedis: Secondary | ICD-10-CM | POA: Diagnosis not present

## 2020-07-25 ENCOUNTER — Other Ambulatory Visit: Payer: Self-pay | Admitting: Family Medicine

## 2020-07-26 ENCOUNTER — Other Ambulatory Visit: Payer: Self-pay | Admitting: Family Medicine

## 2020-07-26 NOTE — Telephone Encounter (Signed)
Please contact patient to have her set up appt for HTN. Thank you!

## 2020-07-27 NOTE — Telephone Encounter (Signed)
Routing back

## 2020-07-27 NOTE — Telephone Encounter (Signed)
LVM to schedule appt for Med Check

## 2020-07-30 NOTE — Telephone Encounter (Signed)
Sent my chart message to schedule appointment.

## 2020-08-13 DIAGNOSIS — F17219 Nicotine dependence, cigarettes, with unspecified nicotine-induced disorders: Secondary | ICD-10-CM | POA: Insufficient documentation

## 2020-08-14 ENCOUNTER — Ambulatory Visit: Payer: BC Managed Care – PPO | Admitting: Family Medicine

## 2020-08-14 ENCOUNTER — Encounter: Payer: Self-pay | Admitting: Family Medicine

## 2020-08-14 ENCOUNTER — Ambulatory Visit (HOSPITAL_COMMUNITY)
Admission: RE | Admit: 2020-08-14 | Discharge: 2020-08-14 | Disposition: A | Discharge status: Home / Self Care | Payer: BC Managed Care – PPO | Source: Ambulatory Visit | Attending: Family Medicine | Admitting: Family Medicine

## 2020-08-14 ENCOUNTER — Other Ambulatory Visit: Payer: Self-pay

## 2020-08-14 VITALS — BP 120/76 | Ht 64.0 in | Wt 182.6 lb

## 2020-08-14 DIAGNOSIS — Q740 Other congenital malformations of upper limb(s), including shoulder girdle: Secondary | ICD-10-CM

## 2020-08-14 DIAGNOSIS — K219 Gastro-esophageal reflux disease without esophagitis: Secondary | ICD-10-CM | POA: Diagnosis not present

## 2020-08-14 DIAGNOSIS — I10 Essential (primary) hypertension: Secondary | ICD-10-CM

## 2020-08-14 DIAGNOSIS — E785 Hyperlipidemia, unspecified: Secondary | ICD-10-CM | POA: Diagnosis not present

## 2020-08-14 DIAGNOSIS — M7989 Other specified soft tissue disorders: Secondary | ICD-10-CM | POA: Diagnosis not present

## 2020-08-14 DIAGNOSIS — M19012 Primary osteoarthritis, left shoulder: Secondary | ICD-10-CM | POA: Diagnosis not present

## 2020-08-14 MED ORDER — LOSARTAN POTASSIUM 100 MG PO TABS: ORAL_TABLET | ORAL | 2 refills | Status: DC

## 2020-08-14 MED ORDER — INDAPAMIDE 2.5 MG PO TABS: ORAL_TABLET | ORAL | 2 refills | Status: DC

## 2020-08-14 MED ORDER — PANTOPRAZOLE SODIUM 40 MG PO TBEC: DELAYED_RELEASE_TABLET | ORAL | 2 refills | Status: DC

## 2020-08-14 NOTE — Progress Notes (Signed)
x  Subjective:    Patient ID: Sandra Trujillo, female    DOB: [birth year], 60 y.o.   MRN: [id number]  Hypertension This is a chronic problem. The current episode started more than 1 year ago. Risk factors for coronary artery disease include dyslipidemia and post-menopausal state. Treatments tried: losartan, lozol.   Essential hypertension, benign - Plan: Comprehensive metabolic panel, Lipid panel  Hyperlipidemia, unspecified hyperlipidemia type - Plan: Comprehensive metabolic panel, Lipid panel  Gastroesophageal reflux disease without esophagitis  Serum calcium elevated - Plan: Comprehensive metabolic panel, PTH, Intact and Calcium  Abnormal prominence of clavicle - Plan: DG Chest 2 View, DG Clavicle Left  Patient is working hard to quit smoking She is noted that the proximal clavicle is become tender and enlarged.  She denies any weight loss fever chills Review of Systems     Objective:   Physical Exam  General-in no acute distress Eyes-no discharge Lungs-respiratory rate normal, CTA CV-no murmurs,RRR Extremities skin warm dry no edema Neuro grossly normal Behavior normal, alert  Enlarged proximal clavicle with forearm large nodule on the bone     Assessment & Plan:  1. Essential hypertension, benign Blood pressure good control continue current measures watch diet - Comprehensive metabolic panel - Lipid panel  2. Hyperlipidemia, unspecified hyperlipidemia type Check lab work.  Continue current medication watch diet - Comprehensive metabolic panel - Lipid panel  3. Gastroesophageal reflux disease without esophagitis Patient states without it and she has a very difficult time with reflux so therefore continue the medication  4. Serum calcium elevated History of elevated serum calciums therefore we will check that along with PTH - Comprehensive metabolic panel - PTH, Intact and Calcium  5. Abnormal prominence of clavicle Prominence of the clavicle needs further  evaluation with her being a smoker need to make sure there is not something underlying within the lungs as well - DG Chest 2 View - DG Clavicle Left  Recently she quit smoking which is a good thing we hope she will be able to sustain that she is using Chantix through her work program  She keeps up with her female health checkups she will send [place] any problems otherwise follow-up within 9 months

## 2020-08-15 NOTE — Telephone Encounter (Signed)
Refills were sent in y the provider at office visit 08/14/20

## 2020-08-15 NOTE — Telephone Encounter (Signed)
Patient had medication follow up yesterday 08/14/2020

## 2020-08-22 ENCOUNTER — Ambulatory Visit: Payer: BC Managed Care – PPO | Admitting: "Endocrinology

## 2020-08-25 DIAGNOSIS — M9905 Segmental and somatic dysfunction of pelvic region: Secondary | ICD-10-CM | POA: Diagnosis not present

## 2020-08-25 DIAGNOSIS — M9903 Segmental and somatic dysfunction of lumbar region: Secondary | ICD-10-CM | POA: Diagnosis not present

## 2020-08-25 DIAGNOSIS — M5412 Radiculopathy, cervical region: Secondary | ICD-10-CM | POA: Diagnosis not present

## 2020-08-25 DIAGNOSIS — M9901 Segmental and somatic dysfunction of cervical region: Secondary | ICD-10-CM | POA: Diagnosis not present

## 2020-08-25 DIAGNOSIS — M955 Acquired deformity of pelvis: Secondary | ICD-10-CM | POA: Diagnosis not present

## 2020-08-25 DIAGNOSIS — M62830 Muscle spasm of back: Secondary | ICD-10-CM | POA: Diagnosis not present

## 2020-08-27 DIAGNOSIS — Z5181 Encounter for therapeutic drug level monitoring: Secondary | ICD-10-CM | POA: Diagnosis not present

## 2020-09-04 DIAGNOSIS — I10 Essential (primary) hypertension: Secondary | ICD-10-CM | POA: Diagnosis not present

## 2020-09-04 DIAGNOSIS — B353 Tinea pedis: Secondary | ICD-10-CM | POA: Diagnosis not present

## 2020-09-04 DIAGNOSIS — Z30431 Encounter for routine checking of intrauterine contraceptive device: Secondary | ICD-10-CM | POA: Diagnosis not present

## 2020-09-04 DIAGNOSIS — Z1159 Encounter for screening for other viral diseases: Secondary | ICD-10-CM | POA: Diagnosis not present

## 2020-09-04 DIAGNOSIS — Z118 Encounter for screening for other infectious and parasitic diseases: Secondary | ICD-10-CM | POA: Diagnosis not present

## 2020-09-04 DIAGNOSIS — Z114 Encounter for screening for human immunodeficiency virus [HIV]: Secondary | ICD-10-CM | POA: Diagnosis not present

## 2020-09-04 DIAGNOSIS — Z1231 Encounter for screening mammogram for malignant neoplasm of breast: Secondary | ICD-10-CM | POA: Diagnosis not present

## 2020-09-04 DIAGNOSIS — Z01419 Encounter for gynecological examination (general) (routine) without abnormal findings: Secondary | ICD-10-CM | POA: Diagnosis not present

## 2020-09-04 DIAGNOSIS — E785 Hyperlipidemia, unspecified: Secondary | ICD-10-CM | POA: Diagnosis not present

## 2020-09-04 DIAGNOSIS — Z6831 Body mass index (BMI) 31.0-31.9, adult: Secondary | ICD-10-CM | POA: Diagnosis not present

## 2020-09-04 DIAGNOSIS — Z113 Encounter for screening for infections with a predominantly sexual mode of transmission: Secondary | ICD-10-CM | POA: Diagnosis not present

## 2020-09-04 DIAGNOSIS — B351 Tinea unguium: Secondary | ICD-10-CM | POA: Diagnosis not present

## 2020-09-05 LAB — PTH, INTACT AND CALCIUM: PTH: 79 pg/mL — ABNORMAL HIGH (ref 15–65)

## 2020-09-05 LAB — COMPREHENSIVE METABOLIC PANEL
ALT: 18 IU/L (ref 0–32)
AST: 15 IU/L (ref 0–40)
Albumin/Globulin Ratio: 1.8 (ref 1.2–2.2)
Albumin: 4.4 g/dL (ref 3.8–4.9)
Alkaline Phosphatase: 97 IU/L (ref 44–121)
BUN/Creatinine Ratio: 16 (ref 9–23)
BUN: 21 mg/dL (ref 6–24)
Bilirubin Total: 0.3 mg/dL (ref 0.0–1.2)
CO2: 25 mmol/L (ref 20–29)
Calcium: 10.8 mg/dL — ABNORMAL HIGH (ref 8.7–10.2)
Chloride: 102 mmol/L (ref 96–106)
Creatinine, Ser: 1.35 mg/dL — ABNORMAL HIGH (ref 0.57–1.00)
Globulin, Total: 2.5 g/dL (ref 1.5–4.5)
Glucose: 111 mg/dL — ABNORMAL HIGH (ref 65–99)
Potassium: 3.9 mmol/L (ref 3.5–5.2)
Sodium: 142 mmol/L (ref 134–144)
Total Protein: 6.9 g/dL (ref 6.0–8.5)
eGFR: 46 mL/min/{1.73_m2} — ABNORMAL LOW (ref >59)

## 2020-09-05 LAB — LIPID PANEL
Chol/HDL Ratio: 4.6 ratio — ABNORMAL HIGH (ref 0.0–4.4)
Cholesterol, Total: 230 mg/dL — ABNORMAL HIGH (ref 100–199)
HDL: 50 mg/dL (ref >39)
LDL Chol Calc (NIH): 153 mg/dL — ABNORMAL HIGH (ref 0–99)
Triglycerides: 151 mg/dL — ABNORMAL HIGH (ref 0–149)
VLDL Cholesterol Cal: 27 mg/dL (ref 5–40)

## 2020-09-10 DIAGNOSIS — F17219 Nicotine dependence, cigarettes, with unspecified nicotine-induced disorders: Secondary | ICD-10-CM | POA: Diagnosis not present

## 2020-09-11 ENCOUNTER — Other Ambulatory Visit: Payer: Self-pay

## 2020-09-11 DIAGNOSIS — I10 Essential (primary) hypertension: Secondary | ICD-10-CM

## 2020-09-11 DIAGNOSIS — E785 Hyperlipidemia, unspecified: Secondary | ICD-10-CM

## 2020-09-11 DIAGNOSIS — E210 Primary hyperparathyroidism: Secondary | ICD-10-CM

## 2020-09-11 MED ORDER — ROSUVASTATIN CALCIUM 40 MG PO TABS
40.0000 mg | ORAL_TABLET | Freq: Every day | ORAL | 3 refills | Status: DC
Start: 2020-09-11 — End: 2021-05-14

## 2020-09-11 NOTE — Progress Notes (Signed)
Patient returned the call and was informed per drs lab results and recommendations, she confirmed has been taking crestor 20 mg 1 daily . New prescription sent and labs ordered. Pt verbalized understanding.

## 2020-09-14 DIAGNOSIS — E210 Primary hyperparathyroidism: Secondary | ICD-10-CM | POA: Diagnosis not present

## 2020-09-15 LAB — PTH, INTACT AND CALCIUM: PTH: 54 pg/mL (ref 15–65)

## 2020-09-15 LAB — COMPREHENSIVE METABOLIC PANEL
ALT: 16 IU/L (ref 0–32)
AST: 16 IU/L (ref 0–40)
Albumin/Globulin Ratio: 2 (ref 1.2–2.2)
Albumin: 4.6 g/dL (ref 3.8–4.9)
Alkaline Phosphatase: 91 IU/L (ref 44–121)
BUN/Creatinine Ratio: 14 (ref 9–23)
BUN: 18 mg/dL (ref 6–24)
Bilirubin Total: 0.3 mg/dL (ref 0.0–1.2)
CO2: 24 mmol/L (ref 20–29)
Calcium: 11 mg/dL — ABNORMAL HIGH (ref 8.7–10.2)
Chloride: 100 mmol/L (ref 96–106)
Creatinine, Ser: 1.33 mg/dL — ABNORMAL HIGH (ref 0.57–1.00)
Globulin, Total: 2.3 g/dL (ref 1.5–4.5)
Glucose: 121 mg/dL — ABNORMAL HIGH (ref 65–99)
Potassium: 3.9 mmol/L (ref 3.5–5.2)
Sodium: 139 mmol/L (ref 134–144)
Total Protein: 6.9 g/dL (ref 6.0–8.5)
eGFR: 47 mL/min/{1.73_m2} — ABNORMAL LOW (ref >59)

## 2020-09-15 LAB — VITAMIN D 25 HYDROXY (VIT D DEFICIENCY, FRACTURES): Vit D, 25-Hydroxy: 43.9 ng/mL (ref 30.0–100.0)

## 2020-09-20 ENCOUNTER — Encounter: Payer: Self-pay | Admitting: "Endocrinology

## 2020-09-20 ENCOUNTER — Other Ambulatory Visit: Payer: Self-pay

## 2020-09-20 ENCOUNTER — Ambulatory Visit: Payer: BC Managed Care – PPO | Admitting: "Endocrinology

## 2020-09-20 DIAGNOSIS — E210 Primary hyperparathyroidism: Secondary | ICD-10-CM | POA: Diagnosis not present

## 2020-09-20 NOTE — Progress Notes (Signed)
09/20/2020, 5:33 PM   Endocrinology follow-up note  Sandra Trujillo is a 60 y.o.-year-old female, referred by her  Kathyrn Drown, MD  . -She is here for follow-up after she was seen in consultation for evaluation for hypercalcemia/hyperparathyroidism.   Past Medical History:  Diagnosis Date   GERD (gastroesophageal reflux disease)    Hyperlipidemia    Hypertension     Past Surgical History:  Procedure Laterality Date   CERVICAL DISCECTOMY     COLONOSCOPY N/A 09/15/2014   SLF: 1. submucosal lesion in the hepatic flexure likely a lipoma 2 . mild diverticulosis in teh sigmoid colon and descending colon 3. the left colon is redundant   COLONOSCOPY WITH ESOPHAGOGASTRODUODENOSCOPY (EGD)  12/2002   GTX:MIWOE HH/mildchanges of reflux/otherwise normal. normal colonoscopy and terminal ileoscopy.   ESOPHAGEAL DILATION N/A 04/02/2015   Procedure: ESOPHAGEAL DILATION;  Surgeon: Danie Binder, MD;  Location: [facility];  Service: Endoscopy;  Laterality: N/A;   ESOPHAGOGASTRODUODENOSCOPY N/A 09/15/2014   SLF: 1. Schatzki ring at the gastroesophagel junction 2. moderate erosive gastritis/duoentitit and three small gastric ulcers due to ASA/ibuprofen   ESOPHAGOGASTRODUODENOSCOPY N/A 04/02/2015   Procedure: ESOPHAGOGASTRODUODENOSCOPY (EGD);  Surgeon: Danie Binder, MD;  Location: [facility];  Service: Endoscopy;  Laterality: N/A;  1315 - moved to 2/13 @ 2:00   SAVORY DILATION N/A 09/15/2014   Procedure: SAVORY DILATION;  Surgeon: Danie Binder, MD;  Location: [facility];  Service: Endoscopy;  Laterality: N/A;    Social History   Tobacco Use   Smoking status: Every Day    Packs/day: 1.00    Years: 25.00    Pack years: 25.00    Types: Cigarettes   Smokeless tobacco: Never  Vaping Use   Vaping Use: Never used  Substance Use Topics   Alcohol use: No    Alcohol/week: 0.0 standard drinks   Drug use: No    Family History   Problem Relation Age of Onset   Hypertension Father    Heart failure Mother    Stroke Mother    Hypertension Mother    Hyperlipidemia Mother    Colon cancer Neg Hx    Inflammatory bowel disease Neg Hx    Liver disease Neg Hx     Outpatient Encounter Medications as of 09/20/2020  Medication Sig   Cholecalciferol (VITAMIN D) 2000 UNITS CAPS Take 1 capsule by mouth daily.   clotrimazole (CLOTRIMAZOLE ANTI-FUNGAL) 1 % cream Apply 1 application topically 2 (two) times daily.   docusate sodium (COLACE) 100 MG capsule Take 100 mg by mouth daily.   fluticasone (FLONASE) 50 MCG/ACT nasal spray SHAKE LIQUID AND USE 2 SPRAYS IN EACH NOSTRIL DAILY   indapamide (LOZOL) 2.5 MG tablet TAKE 1 TABLET(2.5 MG) BY MOUTH DAILY   losartan (COZAAR) 100 MG tablet TAKE 1 TABLET BY MOUTH EVERY DAY   pantoprazole (PROTONIX) 40 MG tablet TAKE 1 TABLET(40 MG) BY MOUTH TWICE DAILY BEFORE A MEAL   rosuvastatin (CRESTOR) 40 MG tablet Take 1 tablet (40 mg total) by mouth daily.   No facility-administered encounter medications on file as of 09/20/2020.    Allergies  Allergen Reactions  Sulfa Antibiotics Other (See Comments)    "Flu like symptoms 10 times worse".   Lisinopril Cough     HPI  Sandra Trujillo was first diagnosed with hypercalcemia in 2019.  Patient has no previously known history of parathyroid, pituitary, adrenal dysfunctions; no family history of such dysfunctions.  She was never offered treatment for hypercalcemia. -Her previsit labs show persistent hypercalcemia at 11 mg per DL.  Her PTH is slightly better at 54 improving from 79.     -Her bone density is normal.  Her 24-hour urine calcium was 107, 31 during last measurement. No prior history of fragility fractures or falls. No history of  kidney stones. She has no new complaints today. No history of CKD.   she is not on HCTZ or other thiazide therapy.  She has history of vitamin D deficiency, currently on vitamin D supplement 2000 units  daily.  No reported change. she is not on calcium supplements,  she eats dairy and green, leafy, vegetables on average amounts.  she does not have a family history of hypercalcemia, pituitary tumors, thyroid cancer, or osteoporosis.   I reviewed her chart and she also has a history of hypertension, hyperlipidemia on treatment.    ROS: Dated as above. PE: BP 116/88   Pulse 84   Ht '5\' 4"'  (1.626 m)   Wt 182 lb (82.6 kg)   BMI 31.24 kg/m , Body mass index is 31.24 kg/m. Wt Readings from Last 3 Encounters:  09/20/20 182 lb (82.6 kg)  08/14/20 182 lb 9.6 oz (82.8 kg)  02/23/20 175 lb 6.4 oz (79.6 kg)     Physical Exam- Limited   CMP ( most recent) CMP     Component Value Date/Time   NA 139 09/14/2020 1444   NA 140 01/10/2013 1120   K 3.9 09/14/2020 1444   K 4.2 01/10/2013 1120   CL 100 09/14/2020 1444   CL 109 (H) 01/10/2013 1120   CO2 24 09/14/2020 1444   CO2 25 01/10/2013 1120   GLUCOSE 121 (H) 09/14/2020 1444   GLUCOSE 89 01/10/2013 1120   BUN 18 09/14/2020 1444   BUN 9 01/10/2013 1120   CREATININE 1.33 (H) 09/14/2020 1444   CREATININE 1.07 01/10/2013 1120   CREATININE 1.19 (H) 12/30/2012 0914   CALCIUM 11.0 (H) 09/14/2020 1444   CALCIUM 9.0 01/10/2013 1120   PROT 6.9 09/14/2020 1444   ALBUMIN 4.6 09/14/2020 1444   AST 16 09/14/2020 1444   ALT 16 09/14/2020 1444   ALKPHOS 91 09/14/2020 1444   BILITOT 0.3 09/14/2020 1444   GFRNONAA 48 (L) 07/20/2019 1024   GFRNONAA >60 01/10/2013 1120   GFRAA 56 (L) 07/20/2019 1024   GFRAA >60 01/10/2013 1120     Diabetic Labs (most recent): Lab Results  Component Value Date   HGBA1C (H) 06/02/2009    5.9 (NOTE)                                                                       According to the ADA Clinical Practice Recommendations for 2011, when HbA1c is used as a screening test:   >=6.5%   Diagnostic of Diabetes Mellitus           (if abnormal result  is confirmed)  5.7-6.4%   Increased risk of developing Diabetes  [place]  References:Diagnosis and Classification of Diabetes Mellitus,Diabetes WNIO,2703,50(KXFGH 1):S62-S69 and Standards of Medical Care in         Diabetes - 2011,Diabetes WEXH,3716,96  (Suppl 1):S11-S61.     Lipid Panel ( most recent) Lipid Panel     Component Value Date/Time   CHOL 230 (H) 09/04/2020 1538   TRIG 151 (H) 09/04/2020 1538   HDL 50 09/04/2020 1538   CHOLHDL 4.6 (H) 09/04/2020 1538   CHOLHDL 3.7 12/30/2012 0914   VLDL 17 12/30/2012 0914   LDLCALC 153 (H) 09/04/2020 1538   LABVLDL 27 09/04/2020 1538     Recent Results (from the past 2160 hour(s))  Comprehensive metabolic panel     Status: Abnormal   Collection Time: 09/04/20  3:38 PM  Result Value Ref Range   Glucose 111 (H) 65 - 99 mg/dL   BUN 21 6 - 24 mg/dL   Creatinine, Ser 1.35 (H) 0.57 - 1.00 mg/dL   eGFR 46 (L) >59 mL/min/1.73   BUN/Creatinine Ratio 16 9 - 23   Sodium 142 134 - 144 mmol/L   Potassium 3.9 3.5 - 5.2 mmol/L   Chloride 102 96 - 106 mmol/L   CO2 25 20 - 29 mmol/L   Calcium 10.8 (H) 8.7 - 10.2 mg/dL   Total Protein 6.9 6.0 - 8.5 g/dL   Albumin 4.4 3.8 - 4.9 g/dL   Globulin, Total 2.5 1.5 - 4.5 g/dL   Albumin/Globulin Ratio 1.8 1.2 - 2.2   Bilirubin Total 0.3 0.0 - 1.2 mg/dL   Alkaline Phosphatase 97 44 - 121 IU/L   AST 15 0 - 40 IU/L   ALT 18 0 - 32 IU/L  Lipid panel     Status: Abnormal   Collection Time: 09/04/20  3:38 PM  Result Value Ref Range   Cholesterol, Total 230 (H) 100 - 199 mg/dL   Triglycerides 151 (H) 0 - 149 mg/dL   HDL 50 >39 mg/dL   VLDL Cholesterol Cal 27 5 - 40 mg/dL   LDL Chol Calc (NIH) 153 (H) 0 - 99 mg/dL   Chol/HDL Ratio 4.6 (H) 0.0 - 4.4 ratio    Comment:                                   T. Chol/HDL Ratio                                             Men  Women                               1/2 Avg.Risk  3.4    3.3                                   Avg.Risk  5.0    4.4                                2X Avg.Risk  9.6    7.1  3X Avg.Risk 23.4   11.0   PTH, Intact and Calcium     Status: Abnormal   Collection Time: 09/04/20  3:38 PM  Result Value Ref Range   PTH 79 (H) 15 - 65 pg/mL   PTH Interp Comment     Comment: Interpretation                 Intact PTH    Calcium                                 (pg/mL)      (mg/dL) Normal                          15 - 65     8.6 - 10.2 Primary Hyperparathyroidism         >65          >10.2 Secondary Hyperparathyroidism       >65          <10.2 Non-Parathyroid Hypercalcemia       <65          >10.2 Hypoparathyroidism                  <15          < 8.6 Non-Parathyroid Hypocalcemia    15 - 65          < 8.6   Comprehensive metabolic panel     Status: Abnormal   Collection Time: 09/14/20  2:44 PM  Result Value Ref Range   Glucose 121 (H) 65 - 99 mg/dL   BUN 18 6 - 24 mg/dL   Creatinine, Ser 1.33 (H) 0.57 - 1.00 mg/dL   eGFR 47 (L) >59 mL/min/1.73   BUN/Creatinine Ratio 14 9 - 23   Sodium 139 134 - 144 mmol/L   Potassium 3.9 3.5 - 5.2 mmol/L   Chloride 100 96 - 106 mmol/L   CO2 24 20 - 29 mmol/L   Calcium 11.0 (H) 8.7 - 10.2 mg/dL   Total Protein 6.9 6.0 - 8.5 g/dL   Albumin 4.6 3.8 - 4.9 g/dL   Globulin, Total 2.3 1.5 - 4.5 g/dL   Albumin/Globulin Ratio 2.0 1.2 - 2.2   Bilirubin Total 0.3 0.0 - 1.2 mg/dL   Alkaline Phosphatase 91 44 - 121 IU/L   AST 16 0 - 40 IU/L   ALT 16 0 - 32 IU/L  PTH, intact and calcium     Status: None   Collection Time: 09/14/20  2:44 PM  Result Value Ref Range   PTH 54 15 - 65 pg/mL   PTH Interp Comment     Comment: Interpretation                 Intact PTH    Calcium                                 (pg/mL)      (mg/dL) Normal                          15 - 65     8.6 - 10.2 Primary Hyperparathyroidism         >65          >10.2 Secondary Hyperparathyroidism       >  65          <10.2 Non-Parathyroid Hypercalcemia       <65          >10.2 Hypoparathyroidism                  <15          < 8.6 Non-Parathyroid Hypocalcemia    15 - 65           < 8.6   VITAMIN D 25 Hydroxy (Vit-D Deficiency, Fractures)     Status: None   Collection Time: 09/14/20  2:44 PM  Result Value Ref Range   Vit D, 25-Hydroxy 43.9 30.0 - 100.0 ng/mL    Comment: Vitamin D deficiency has been defined by the [place] practice guideline as a level of serum 25-OH vitamin D less than 20 ng/mL (1,2). The Endocrine Society went on to further define vitamin D insufficiency as a level between 21 and 29 ng/mL (2). 1. IOM (Institute of Medicine). 2010. Dietary reference    intakes for calcium and D. [place]: The    [employer]. 2. Holick MF, Binkley [place], Bischoff-Ferrari HA, et al.    Evaluation, treatment, and prevention of vitamin D    deficiency: an Endocrine Society clinical practice    guideline. JCEM. 2011 Jul; 96(7):1911-30.       Assessment: 1. Hypercalcemia / Hyperparathyroidism  Plan: Patient has had several instances of elevated calcium, with the highest level being at 11.2 mg per DL associated with higher PTH of 65.  Her recent labs show calcium at 11, PTH at 54.     - Patient also  has vitamin D deficiency-currently on vitamin D supplement.  -Her repeat 24-hour urine calcium measurement reveals 107, higher than last measurement at 31. -Her work-up is consistent with hypercalcemia due to primary hyperparathyroidism.   - I discussed with the patient about the physiology of calcium and parathyroid hormone, and possible  effects of  increased PTH/ Calcium , including kidney stones, cardiac dysrhythmias, osteoporosis, abdominal pain, etc.  - No apparent complications from hypercalcemia/hyperparathyroidism: no history of  nephrolithiasis,  osteoporosis,fragility fractures. No abdominal pain, no major mood disorders, no bone pain.  -She will continue on observation until her next visit in 4 months with repeat PTH/calcium, and 24-hour urine calcium measurement.     She is advised to  continue follow-up with her PMD Dr. Sallee Lange.   I spent 22 minutes in the care of the patient today including review of labs from Thyroid Function, CMP, and other relevant labs ; imaging/biopsy records (current and previous including abstractions from other facilities); face-to-face time discussing  her lab results and symptoms, medications doses, her options of short and long term treatment based on the latest standards of care / guidelines;   and documenting the encounter.  Sandra Trujillo  participated in the discussions, expressed understanding, and voiced agreement with the above plans.  All questions were answered to her satisfaction. she is encouraged to contact clinic should she have any questions or concerns prior to her return visit.   - Return in about 4 months (around 01/20/2021) for F/U with Pre-visit Labs, [place] [facility], MD [facility] [address] [place],  [address] Phone: [phone]  Fax: [phone]    This note was partially dictated with voice recognition software. Similar sounding words can be transcribed inadequately or may  not  be corrected upon review.  09/20/2020, 5:33 PM

## 2020-09-21 ENCOUNTER — Other Ambulatory Visit: Payer: Self-pay

## 2020-09-21 ENCOUNTER — Other Ambulatory Visit: Payer: Self-pay | Admitting: *Deleted

## 2020-09-21 MED ORDER — FLUTICASONE PROPIONATE 50 MCG/ACT NA SUSP
NASAL | 0 refills | Status: DC
Start: 2020-09-21 — End: 2021-05-14
  Filled 2020-09-21: qty 16, 30d supply, fill #0

## 2020-09-22 DIAGNOSIS — M62830 Muscle spasm of back: Secondary | ICD-10-CM | POA: Diagnosis not present

## 2020-09-22 DIAGNOSIS — M9901 Segmental and somatic dysfunction of cervical region: Secondary | ICD-10-CM | POA: Diagnosis not present

## 2020-09-22 DIAGNOSIS — M9905 Segmental and somatic dysfunction of pelvic region: Secondary | ICD-10-CM | POA: Diagnosis not present

## 2020-09-22 DIAGNOSIS — M955 Acquired deformity of pelvis: Secondary | ICD-10-CM | POA: Diagnosis not present

## 2020-09-22 DIAGNOSIS — M9903 Segmental and somatic dysfunction of lumbar region: Secondary | ICD-10-CM | POA: Diagnosis not present

## 2020-09-22 DIAGNOSIS — M5412 Radiculopathy, cervical region: Secondary | ICD-10-CM | POA: Diagnosis not present

## 2020-10-08 ENCOUNTER — Other Ambulatory Visit: Payer: Self-pay

## 2020-10-27 DIAGNOSIS — M9903 Segmental and somatic dysfunction of lumbar region: Secondary | ICD-10-CM | POA: Diagnosis not present

## 2020-10-27 DIAGNOSIS — M5412 Radiculopathy, cervical region: Secondary | ICD-10-CM | POA: Diagnosis not present

## 2020-10-27 DIAGNOSIS — M9901 Segmental and somatic dysfunction of cervical region: Secondary | ICD-10-CM | POA: Diagnosis not present

## 2020-10-27 DIAGNOSIS — M62830 Muscle spasm of back: Secondary | ICD-10-CM | POA: Diagnosis not present

## 2020-11-10 DIAGNOSIS — M62830 Muscle spasm of back: Secondary | ICD-10-CM | POA: Diagnosis not present

## 2020-11-10 DIAGNOSIS — M9901 Segmental and somatic dysfunction of cervical region: Secondary | ICD-10-CM | POA: Diagnosis not present

## 2020-11-10 DIAGNOSIS — M5412 Radiculopathy, cervical region: Secondary | ICD-10-CM | POA: Diagnosis not present

## 2020-11-10 DIAGNOSIS — M9903 Segmental and somatic dysfunction of lumbar region: Secondary | ICD-10-CM | POA: Diagnosis not present

## 2020-11-24 DIAGNOSIS — M62830 Muscle spasm of back: Secondary | ICD-10-CM | POA: Diagnosis not present

## 2020-11-24 DIAGNOSIS — M5412 Radiculopathy, cervical region: Secondary | ICD-10-CM | POA: Diagnosis not present

## 2020-11-24 DIAGNOSIS — M9901 Segmental and somatic dysfunction of cervical region: Secondary | ICD-10-CM | POA: Diagnosis not present

## 2020-11-24 DIAGNOSIS — M9903 Segmental and somatic dysfunction of lumbar region: Secondary | ICD-10-CM | POA: Diagnosis not present

## 2020-12-22 DIAGNOSIS — M62830 Muscle spasm of back: Secondary | ICD-10-CM | POA: Diagnosis not present

## 2020-12-22 DIAGNOSIS — M9901 Segmental and somatic dysfunction of cervical region: Secondary | ICD-10-CM | POA: Diagnosis not present

## 2020-12-22 DIAGNOSIS — M9903 Segmental and somatic dysfunction of lumbar region: Secondary | ICD-10-CM | POA: Diagnosis not present

## 2020-12-22 DIAGNOSIS — M5412 Radiculopathy, cervical region: Secondary | ICD-10-CM | POA: Diagnosis not present

## 2020-12-24 DIAGNOSIS — R1314 Dysphagia, pharyngoesophageal phase: Secondary | ICD-10-CM | POA: Insufficient documentation

## 2020-12-24 DIAGNOSIS — L309 Dermatitis, unspecified: Secondary | ICD-10-CM | POA: Diagnosis not present

## 2020-12-24 DIAGNOSIS — B351 Tinea unguium: Secondary | ICD-10-CM | POA: Diagnosis not present

## 2021-01-02 DIAGNOSIS — L280 Lichen simplex chronicus: Secondary | ICD-10-CM | POA: Diagnosis not present

## 2021-01-02 DIAGNOSIS — R21 Rash and other nonspecific skin eruption: Secondary | ICD-10-CM | POA: Diagnosis not present

## 2021-01-02 DIAGNOSIS — L401 Generalized pustular psoriasis: Secondary | ICD-10-CM | POA: Diagnosis not present

## 2021-01-06 DIAGNOSIS — E210 Primary hyperparathyroidism: Secondary | ICD-10-CM | POA: Diagnosis not present

## 2021-01-07 DIAGNOSIS — I10 Essential (primary) hypertension: Secondary | ICD-10-CM | POA: Diagnosis not present

## 2021-01-07 DIAGNOSIS — E210 Primary hyperparathyroidism: Secondary | ICD-10-CM | POA: Diagnosis not present

## 2021-01-07 DIAGNOSIS — E785 Hyperlipidemia, unspecified: Secondary | ICD-10-CM | POA: Diagnosis not present

## 2021-01-08 LAB — BASIC METABOLIC PANEL
BUN/Creatinine Ratio: 18 (ref 9–23)
BUN: 24 mg/dL (ref 6–24)
CO2: 26 mmol/L (ref 20–29)
Calcium: 11.3 mg/dL — ABNORMAL HIGH (ref 8.7–10.2)
Chloride: 103 mmol/L (ref 96–106)
Creatinine, Ser: 1.31 mg/dL — ABNORMAL HIGH (ref 0.57–1.00)
Glucose: 102 mg/dL — ABNORMAL HIGH (ref 70–99)
Potassium: 4 mmol/L (ref 3.5–5.2)
Sodium: 142 mmol/L (ref 134–144)
eGFR: 48 mL/min/{1.73_m2} — ABNORMAL LOW (ref >59)

## 2021-01-08 LAB — LIPID PANEL
Chol/HDL Ratio: 3.2 ratio (ref 0.0–4.4)
Cholesterol, Total: 159 mg/dL (ref 100–199)
HDL: 50 mg/dL (ref >39)
LDL Chol Calc (NIH): 93 mg/dL (ref 0–99)
Triglycerides: 87 mg/dL (ref 0–149)
VLDL Cholesterol Cal: 16 mg/dL (ref 5–40)

## 2021-01-08 LAB — COMPREHENSIVE METABOLIC PANEL
ALT: 21 IU/L (ref 0–32)
AST: 16 IU/L (ref 0–40)
Albumin/Globulin Ratio: 2.4 — ABNORMAL HIGH (ref 1.2–2.2)
Albumin: 4.7 g/dL (ref 3.8–4.9)
Alkaline Phosphatase: 92 IU/L (ref 44–121)
BUN/Creatinine Ratio: 18 (ref 9–23)
BUN: 23 mg/dL (ref 6–24)
Bilirubin Total: 0.5 mg/dL (ref 0.0–1.2)
CO2: 24 mmol/L (ref 20–29)
Calcium: 11.2 mg/dL — ABNORMAL HIGH (ref 8.7–10.2)
Chloride: 102 mmol/L (ref 96–106)
Creatinine, Ser: 1.27 mg/dL — ABNORMAL HIGH (ref 0.57–1.00)
Globulin, Total: 2 g/dL (ref 1.5–4.5)
Glucose: 103 mg/dL — ABNORMAL HIGH (ref 70–99)
Potassium: 3.9 mmol/L (ref 3.5–5.2)
Sodium: 141 mmol/L (ref 134–144)
Total Protein: 6.7 g/dL (ref 6.0–8.5)
eGFR: 49 mL/min/{1.73_m2} — ABNORMAL LOW (ref >59)

## 2021-01-08 LAB — HEPATIC FUNCTION PANEL
ALT: 22 IU/L (ref 0–32)
AST: 21 IU/L (ref 0–40)
Albumin: 4.6 g/dL (ref 3.8–4.9)
Alkaline Phosphatase: 94 IU/L (ref 44–121)
Bilirubin Total: 0.4 mg/dL (ref 0.0–1.2)
Bilirubin, Direct: 0.14 mg/dL (ref 0.00–0.40)
Total Protein: 7 g/dL (ref 6.0–8.5)

## 2021-01-08 LAB — PTH, INTACT AND CALCIUM
PTH: 53 pg/mL (ref 15–65)
PTH: 56 pg/mL (ref 15–65)

## 2021-01-08 LAB — CALCIUM, URINE, 24 HOUR
Calcium, 24H Urine: 37 mg/24 hr (ref 0–320)
Calcium, Urine: 3.7 mg/dL

## 2021-01-08 LAB — CREATININE, URINE, 24 HOUR
Creatinine, 24H Ur: 1557 mg/24 hr (ref 800–1800)
Creatinine, Urine: 155.7 mg/dL

## 2021-01-19 DIAGNOSIS — M5412 Radiculopathy, cervical region: Secondary | ICD-10-CM | POA: Diagnosis not present

## 2021-01-19 DIAGNOSIS — M62830 Muscle spasm of back: Secondary | ICD-10-CM | POA: Diagnosis not present

## 2021-01-19 DIAGNOSIS — M9903 Segmental and somatic dysfunction of lumbar region: Secondary | ICD-10-CM | POA: Diagnosis not present

## 2021-01-19 DIAGNOSIS — M9901 Segmental and somatic dysfunction of cervical region: Secondary | ICD-10-CM | POA: Diagnosis not present

## 2021-01-23 ENCOUNTER — Ambulatory Visit: Payer: BC Managed Care – PPO | Admitting: "Endocrinology

## 2021-01-23 ENCOUNTER — Encounter: Payer: Self-pay | Admitting: "Endocrinology

## 2021-01-23 ENCOUNTER — Other Ambulatory Visit: Payer: Self-pay

## 2021-01-23 DIAGNOSIS — E210 Primary hyperparathyroidism: Secondary | ICD-10-CM

## 2021-01-23 DIAGNOSIS — L2089 Other atopic dermatitis: Secondary | ICD-10-CM | POA: Diagnosis not present

## 2021-01-23 NOTE — Progress Notes (Signed)
01/23/2021, 2:26 PM   Endocrinology follow-up note  Sandra Trujillo is a 60 y.o.-year-old female, referred by her  Kathyrn Drown, MD  . -She is here for follow-up after she was seen in consultation for evaluation for hypercalcemia/hyperparathyroidism.   Past Medical History:  Diagnosis Date   GERD (gastroesophageal reflux disease)    Hyperlipidemia    Hypertension     Past Surgical History:  Procedure Laterality Date   CERVICAL DISCECTOMY     COLONOSCOPY N/A 09/15/2014   SLF: 1. submucosal lesion in the hepatic flexure likely a lipoma 2 . mild diverticulosis in teh sigmoid colon and descending colon 3. the left colon is redundant   COLONOSCOPY WITH ESOPHAGOGASTRODUODENOSCOPY (EGD)  12/2002   PQD:IYMEB HH/mildchanges of reflux/otherwise normal. normal colonoscopy and terminal ileoscopy.   ESOPHAGEAL DILATION N/A 04/02/2015   Procedure: ESOPHAGEAL DILATION;  Surgeon: Danie Binder, MD;  [facility];  Service: Endoscopy;  Laterality: N/A;   ESOPHAGOGASTRODUODENOSCOPY N/A 09/15/2014   SLF: 1. Schatzki ring at the gastroesophagel junction 2. moderate erosive gastritis/duoentitit and three small gastric ulcers due to ASA/ibuprofen   ESOPHAGOGASTRODUODENOSCOPY N/A 04/02/2015   Procedure: ESOPHAGOGASTRODUODENOSCOPY (EGD);  Surgeon: Danie Binder, MD;  [facility];  Service: Endoscopy;  Laterality: N/A;  1315 - moved to 2/13 @ 2:00   SAVORY DILATION N/A 09/15/2014   Procedure: SAVORY DILATION;  Surgeon: Danie Binder, MD;  [facility];  Service: Endoscopy;  Laterality: N/A;    Social History   Tobacco Use   Smoking status: Every Day    Packs/day: 1.00    Years: 25.00    Pack years: 25.00    Types: Cigarettes   Smokeless tobacco: Never  Vaping Use   Vaping Use: Never used  Substance Use Topics   Alcohol use: No    Alcohol/week: 0.0 standard drinks   Drug use: No    Family History   Problem Relation Age of Onset   Hypertension Father    Heart failure Mother    Stroke Mother    Hypertension Mother    Hyperlipidemia Mother    Colon cancer Neg Hx    Inflammatory bowel disease Neg Hx    Liver disease Neg Hx     Outpatient Encounter Medications as of 01/23/2021  Medication Sig   Cholecalciferol (VITAMIN D) 2000 UNITS CAPS Take 1 capsule by mouth daily.   clobetasol ointment (TEMOVATE) 0.05 % Apply topically 2 (two) times daily.   docusate sodium (COLACE) 100 MG capsule Take 100 mg by mouth daily.   fluticasone (FLONASE) 50 MCG/ACT nasal spray SHAKE LIQUID AND USE 2 SPRAYS IN EACH NOSTRIL DAILY   indapamide (LOZOL) 2.5 MG tablet TAKE 1 TABLET(2.5 MG) BY MOUTH DAILY   losartan (COZAAR) 100 MG tablet TAKE 1 TABLET BY MOUTH EVERY DAY   pantoprazole (PROTONIX) 40 MG tablet TAKE 1 TABLET(40 MG) BY MOUTH TWICE DAILY BEFORE A MEAL   rosuvastatin (CRESTOR) 40 MG tablet Take 1 tablet (40 mg total) by mouth daily.   [DISCONTINUED] clotrimazole (CLOTRIMAZOLE ANTI-FUNGAL) 1 % cream Apply 1 application topically 2 (two) times daily.   No facility-administered encounter medications  on file as of 01/23/2021.    Allergies  Allergen Reactions   Sulfa Antibiotics Other (See Comments)    "Flu like symptoms 10 times worse".   Lisinopril Cough     HPI  Sandra Trujillo was first diagnosed with hypercalcemia in 2019.  She was put on observation status to solidify diagnostic parameters.  She presents with persistent elevated calcium at 11.2, PTH between 53-79.  24-hour urine calcium highest was 107.    She has normal bone density.    No prior history of fragility fractures or falls. No history of  kidney stones. She has no new complaints today. No history of CKD.   she is not on HCTZ or other thiazide therapy.  She has history of vitamin D deficiency, currently on vitamin D supplement 2000 units daily.  No reported change. she is not on calcium supplements,  she eats dairy and  green, leafy, vegetables on average amounts.  she does not have a family history of hypercalcemia, pituitary tumors, thyroid cancer, or osteoporosis.   I reviewed her chart and she also has a history of hypertension, hyperlipidemia on treatment.    ROS: Dated as above. PE: BP (!) 144/80   Pulse 84   Ht 5' 4" (1.626 m)   Wt 187 lb 12.8 oz (85.2 kg)   BMI 32.24 kg/m , Body mass index is 32.24 kg/m. Wt Readings from Last 3 Encounters:  01/23/21 187 lb 12.8 oz (85.2 kg)  09/20/20 182 lb (82.6 kg)  08/14/20 182 lb 9.6 oz (82.8 kg)     Physical Exam- Limited   CMP ( most recent) CMP     Component Value Date/Time   NA 141 01/07/2021 0827   NA 140 01/10/2013 1120   K 3.9 01/07/2021 0827   K 4.2 01/10/2013 1120   CL 102 01/07/2021 0827   CL 109 (H) 01/10/2013 1120   CO2 24 01/07/2021 0827   CO2 25 01/10/2013 1120   GLUCOSE 103 (H) 01/07/2021 0827   GLUCOSE 89 01/10/2013 1120   BUN 23 01/07/2021 0827   BUN 9 01/10/2013 1120   CREATININE 1.27 (H) 01/07/2021 0827   CREATININE 1.07 01/10/2013 1120   CREATININE 1.19 (H) 12/30/2012 0914   CALCIUM 11.2 (H) 01/07/2021 0827   CALCIUM 9.0 01/10/2013 1120   PROT 6.7 01/07/2021 0827   ALBUMIN 4.7 01/07/2021 0827   AST 16 01/07/2021 0827   ALT 21 01/07/2021 0827   ALKPHOS 92 01/07/2021 0827   BILITOT 0.5 01/07/2021 0827   GFRNONAA 48 (L) 07/20/2019 1024   GFRNONAA >60 01/10/2013 1120   GFRAA 56 (L) 07/20/2019 1024   GFRAA >60 01/10/2013 1120     Diabetic Labs (most recent): Lab Results  Component Value Date   HGBA1C (H) 06/02/2009    5.9 (NOTE)                                                                       According to the ADA Clinical Practice Recommendations for 2011, when HbA1c is used as a screening test:   >=6.5%   Diagnostic of Diabetes Mellitus           (if abnormal result  is confirmed)  5.7-6.4%   Increased risk of developing  Diabetes Mellitus  References:Diagnosis and Classification of Diabetes  [place],Diabetes ZOXW,9604,54(UJWJX 1):S62-S69 and Standards of Medical Care in         Diabetes - 2011,Diabetes BJYN,8295,62  (Suppl 1):S11-S61.     Lipid Panel ( most recent) Lipid Panel     Component Value Date/Time   CHOL 159 01/07/2021 0823   TRIG 87 01/07/2021 0823   HDL 50 01/07/2021 0823   CHOLHDL 3.2 01/07/2021 0823   CHOLHDL 3.7 12/30/2012 0914   VLDL 17 12/30/2012 0914   LDLCALC 93 01/07/2021 0823   LABVLDL 16 01/07/2021 0823     Recent Results (from the past 2160 hour(s))  Creatinine, urine, 24 hour     Status: None   Collection Time: 01/06/21  7:30 AM  Result Value Ref Range   Creatinine, Urine 155.7 Not Estab. mg/dL   Creatinine, 24H Ur 1,557 800 - 1,800 mg/24 hr  Calcium, urine, 24 hour     Status: None   Collection Time: 01/06/21  7:30 AM  Result Value Ref Range   Calcium, Urine 3.7 Not Estab. mg/dL   Calcium, 24H Urine 37 0 - 320 mg/24 hr  Basic metabolic panel     Status: Abnormal   Collection Time: 01/07/21  8:23 AM  Result Value Ref Range   Glucose 102 (H) 70 - 99 mg/dL   BUN 24 6 - 24 mg/dL   Creatinine, Ser 1.31 (H) 0.57 - 1.00 mg/dL   eGFR 48 (L) >59 mL/min/1.73   BUN/Creatinine Ratio 18 9 - 23   Sodium 142 134 - 144 mmol/L   Potassium 4.0 3.5 - 5.2 mmol/L   Chloride 103 96 - 106 mmol/L   CO2 26 20 - 29 mmol/L   Calcium 11.3 (H) 8.7 - 10.2 mg/dL  PTH, Intact and Calcium     Status: None   Collection Time: 01/07/21  8:23 AM  Result Value Ref Range   PTH 53 15 - 65 pg/mL   PTH Interp Comment     Comment: Interpretation                 Intact PTH    Calcium                                 (pg/mL)      (mg/dL) Normal                          15 - 65     8.6 - 10.2 Primary Hyperparathyroidism         >65          >10.2 Secondary Hyperparathyroidism       >65          <10.2 Non-Parathyroid Hypercalcemia       <65          >10.2 Hypoparathyroidism                  <15          < 8.6 Non-Parathyroid Hypocalcemia    15 - 65          < 8.6   Lipid  panel     Status: None   Collection Time: 01/07/21  8:23 AM  Result Value Ref Range   Cholesterol, Total 159 100 - 199 mg/dL   Triglycerides 87 0 - 149 mg/dL   HDL 50 >39 mg/dL   VLDL  Cholesterol Cal 16 5 - 40 mg/dL   LDL Chol Calc (NIH) 93 0 - 99 mg/dL   Chol/HDL Ratio 3.2 0.0 - 4.4 ratio    Comment:                                   T. Chol/HDL Ratio                                             Men  Women                               1/2 Avg.Risk  3.4    3.3                                   Avg.Risk  5.0    4.4                                2X Avg.Risk  9.6    7.1                                3X Avg.Risk 23.4   11.0   Hepatic function panel     Status: None   Collection Time: 01/07/21  8:23 AM  Result Value Ref Range   Total Protein 7.0 6.0 - 8.5 g/dL   Albumin 4.6 3.8 - 4.9 g/dL   Bilirubin Total 0.4 0.0 - 1.2 mg/dL   Bilirubin, Direct 0.14 0.00 - 0.40 mg/dL   Alkaline Phosphatase 94 44 - 121 IU/L   AST 21 0 - 40 IU/L   ALT 22 0 - 32 IU/L  Comprehensive metabolic panel     Status: Abnormal   Collection Time: 01/07/21  8:27 AM  Result Value Ref Range   Glucose 103 (H) 70 - 99 mg/dL   BUN 23 6 - 24 mg/dL   Creatinine, Ser 1.27 (H) 0.57 - 1.00 mg/dL   eGFR 49 (L) >59 mL/min/1.73   BUN/Creatinine Ratio 18 9 - 23   Sodium 141 134 - 144 mmol/L   Potassium 3.9 3.5 - 5.2 mmol/L   Chloride 102 96 - 106 mmol/L   CO2 24 20 - 29 mmol/L   Calcium 11.2 (H) 8.7 - 10.2 mg/dL   Total Protein 6.7 6.0 - 8.5 g/dL   Albumin 4.7 3.8 - 4.9 g/dL   Globulin, Total 2.0 1.5 - 4.5 g/dL   Albumin/Globulin Ratio 2.4 (H) 1.2 - 2.2   Bilirubin Total 0.5 0.0 - 1.2 mg/dL   Alkaline Phosphatase 92 44 - 121 IU/L   AST 16 0 - 40 IU/L   ALT 21 0 - 32 IU/L  PTH, intact and calcium     Status: None   Collection Time: 01/07/21  8:27 AM  Result Value Ref Range   PTH 56 15 - 65 pg/mL   PTH Interp Comment     Comment: Interpretation                 Intact PTH    Calcium                                  (  pg/mL)      (mg/dL) Normal                          15 - 65     8.6 - 10.2 Primary Hyperparathyroidism         >65          >10.2 Secondary Hyperparathyroidism       >65          <10.2 Non-Parathyroid Hypercalcemia       <65          >10.2 Hypoparathyroidism                  <15          < 8.6 Non-Parathyroid Hypocalcemia    15 - 65          < 8.6       Assessment: 1. Hypercalcemia / Hyperparathyroidism  Plan: Patient has had several instances of elevated calcium, with the highest level being at 11.3 mg per DL associated with higher PTH of 65.  She returns with persistent hypercalcemia 11.2, PTH at 56.     - Patient also  has vitamin D deficiency-currently on vitamin D supplement.  -Her highest 24-hour urine calcium measurement revealed 107 mg / 24 hours.  She has normal bone density. -Her work-up is consistent with hypercalcemia due to primary hyperparathyroidism.   - I discussed with the patient about the physiology of calcium and parathyroid hormone, and possible  effects of  increased PTH/ Calcium , including kidney stones, cardiac dysrhythmias, osteoporosis, abdominal pain, etc.  - No apparent complications from hypercalcemia/hyperparathyroidism: no history of  nephrolithiasis,  osteoporosis,fragility fractures. No abdominal pain, no major mood disorders, no bone pain.  -At this time, she is offered her definitive treatment option.  She is surgical candidate.  I discussed and referred her to Dr. Armandina Gemma of [facility] surgery.    She is advised to continue follow-up with her PMD Dr. Sallee Lange.     - Return in about 9 weeks (around 03/27/2021) for F/U with Labs after Surgery.   Glade Lloyd, MD [facility] [address] [place], [place] [address] Phone: [phone]  Fax: [phone]    This note was partially dictated with voice recognition software. Similar sounding words can be transcribed inadequately  or may not  be corrected upon review.  01/23/2021, 2:26 PM

## 2021-01-29 ENCOUNTER — Ambulatory Visit
Admission: EM | Admit: 2021-01-29 | Discharge: 2021-01-29 | Disposition: A | Discharge status: Home / Self Care | Payer: BC Managed Care – PPO | Attending: Family Medicine | Admitting: Family Medicine

## 2021-01-29 ENCOUNTER — Other Ambulatory Visit: Payer: Self-pay

## 2021-01-29 DIAGNOSIS — J209 Acute bronchitis, unspecified: Secondary | ICD-10-CM | POA: Diagnosis not present

## 2021-01-29 MED ORDER — ALBUTEROL SULFATE HFA 108 (90 BASE) MCG/ACT IN AERS: 2.0000 | INHALATION_SPRAY | Freq: Four times a day (QID) | RESPIRATORY_TRACT | 0 refills | Status: DC | PRN

## 2021-01-29 MED ORDER — PROMETHAZINE-DM 6.25-15 MG/5ML PO SYRP: 5.0000 mL | ORAL_SOLUTION | Freq: Four times a day (QID) | ORAL | 0 refills | Status: DC | PRN

## 2021-01-29 MED ORDER — PREDNISONE 20 MG PO TABS: 40.0000 mg | ORAL_TABLET | Freq: Every day | ORAL | 0 refills | Status: DC

## 2021-01-29 NOTE — ED Triage Notes (Signed)
Pt presents with cough and sore throat with hoarseness for almost 2 weeks

## 2021-01-29 NOTE — ED Provider Notes (Signed)
[facility]    CSN: [id number] Arrival date & time: 01/29/21  1355      History   Chief Complaint Chief Complaint  Patient presents with   Cough    HPI Sandra Trujillo is a 60 y.o. female.   Patient presenting today with 2-week history of persistent hacking cough, occasional wheezes, sore throat, hoarse voice.  Started as a cold type illness which overall is improving but the symptoms linger.  Denies chest pain, shortness of breath, fever, chills, body aches, abdominal pain, nausea vomiting or diarrhea.  Tried over-the-counter cold and congestion medications with no relief.  No known history of seasonal allergies or asthma though has had bronchitis in the past.   Past Medical History:  Diagnosis Date   GERD (gastroesophageal reflux disease)    Hyperlipidemia    Hypertension     Patient Active Problem List   Diagnosis Date Noted   Hyperparathyroidism, primary ([place]) 02/23/2020   Staph skin infection 01/27/2020   Hypercalcemia 10/06/2019   Thunderclap headache 07/13/2019   Esophageal dysphagia 03/19/2015   Abdominal pain, epigastric 03/19/2015   PUD (peptic ulcer disease) 03/19/2015   Dysphagia, pharyngoesophageal phase    GERD (gastroesophageal reflux disease) 08/14/2014   Encounter for screening colonoscopy 08/14/2014   Vitamin D deficiency 05/31/2014   Multiple thyroid nodules 05/22/2014   Gastritis 05/22/2014   Dysphagia 05/22/2014   Essential hypertension, benign 12/16/2012   Hyperlipidemia 12/16/2012   History of tobacco abuse 12/16/2012    Past Surgical History:  Procedure Laterality Date   CERVICAL DISCECTOMY     COLONOSCOPY N/A 09/15/2014   SLF: 1. submucosal lesion in the hepatic flexure likely a lipoma 2 . mild diverticulosis in teh sigmoid colon and descending colon 3. the left colon is redundant   COLONOSCOPY WITH ESOPHAGOGASTRODUODENOSCOPY (EGD)  12/2002   SVX:BLTJQ HH/mildchanges of reflux/otherwise normal. normal colonoscopy and terminal  ileoscopy.   ESOPHAGEAL DILATION N/A 04/02/2015   Procedure: ESOPHAGEAL DILATION;  Surgeon: Danie Binder, MD;  [facility];  Service: Endoscopy;  Laterality: N/A;   ESOPHAGOGASTRODUODENOSCOPY N/A 09/15/2014   SLF: 1. Schatzki ring at the gastroesophagel junction 2. moderate erosive gastritis/duoentitit and three small gastric ulcers due to ASA/ibuprofen   ESOPHAGOGASTRODUODENOSCOPY N/A 04/02/2015   Procedure: ESOPHAGOGASTRODUODENOSCOPY (EGD);  Surgeon: Danie Binder, MD;  [facility];  Service: Endoscopy;  Laterality: N/A;  1315 - moved to 2/13 @ 2:00   SAVORY DILATION N/A 09/15/2014   Procedure: SAVORY DILATION;  Surgeon: Danie Binder, MD;  [facility];  Service: Endoscopy;  Laterality: N/A;    OB History   No obstetric history on file.      Home Medications    Prior to Admission medications   Medication Sig Start Date End Date Taking? Authorizing Provider  albuterol (VENTOLIN HFA) 108 (90 Base) MCG/ACT inhaler Inhale 2 puffs into the lungs every 6 (six) hours as needed for wheezing or shortness of breath. 01/29/21  Yes Volney American, PA-C  predniSONE (DELTASONE) 20 MG tablet Take 2 tablets (40 mg total) by mouth daily with breakfast. 01/29/21  Yes Volney American, PA-C  promethazine-dextromethorphan (PROMETHAZINE-DM) 6.25-15 MG/5ML syrup Take 5 mLs by mouth 4 (four) times daily as needed. 01/29/21  Yes Volney American, PA-C  Cholecalciferol (VITAMIN D) 2000 UNITS CAPS Take 1 capsule by mouth daily.    [provider]  clobetasol ointment (TEMOVATE) 0.05 % Apply topically 2 (two) times daily. 01/02/21   [provider]  docusate sodium (COLACE) 100 MG capsule Take 100 mg by mouth daily.    [provider]  fluticasone (FLONASE) 50 MCG/ACT nasal spray SHAKE LIQUID AND USE 2 SPRAYS IN EACH NOSTRIL DAILY 09/21/20   Sallee Lange A, MD  indapamide (LOZOL) 2.5 MG tablet TAKE 1 TABLET(2.5 MG) BY MOUTH DAILY  08/14/20   Kathyrn Drown, MD  losartan (COZAAR) 100 MG tablet TAKE 1 TABLET BY MOUTH EVERY DAY 08/14/20   Kathyrn Drown, MD  pantoprazole (PROTONIX) 40 MG tablet TAKE 1 TABLET(40 MG) BY MOUTH TWICE DAILY BEFORE A MEAL 08/14/20   Kathyrn Drown, MD  rosuvastatin (CRESTOR) 40 MG tablet Take 1 tablet (40 mg total) by mouth daily. 09/11/20   Kathyrn Drown, MD    Family History Family History  Problem Relation Age of Onset   Hypertension Father    Heart failure Mother    Stroke Mother    Hypertension Mother    Hyperlipidemia Mother    Colon cancer Neg Hx    Inflammatory bowel disease Neg Hx    Liver disease Neg Hx     Social History Social History   Tobacco Use   Smoking status: Every Day    Packs/day: 1.00    Years: 25.00    Pack years: 25.00    Types: Cigarettes   Smokeless tobacco: Never  Vaping Use   Vaping Use: Never used  Substance Use Topics   Alcohol use: No    Alcohol/week: 0.0 standard drinks   Drug use: No     Allergies   Sulfa antibiotics and Lisinopril   Review of Systems Review of Systems Per HPI  Physical Exam Triage Vital Signs ED Triage Vitals  Enc Vitals Group     BP 01/29/21 1546 (!) 144/90     Pulse Rate 01/29/21 1546 88     Resp 01/29/21 1546 14     Temp 01/29/21 1546 98.4 F (36.9 C)     Temp Source 01/29/21 1546 Oral     SpO2 01/29/21 1546 96 %     Weight --      Height --      Head Circumference --      Peak Flow --      Pain Score 01/29/21 1550 0     Pain Loc --      Pain Edu? --      Excl. in [place]? --    No data found.  Updated Vital Signs BP (!) 144/90 (BP Location: Right Arm)    Pulse 88    Temp 98.4 F (36.9 C) (Oral)    Resp 14    SpO2 96%   Visual Acuity Right Eye Distance:   Left Eye Distance:   Bilateral Distance:    Right Eye Near:   Left Eye Near:    Bilateral Near:     Physical Exam Vitals and nursing note reviewed.  Constitutional:      Appearance: Normal appearance.  HENT:     Head: Atraumatic.      Right Ear: Tympanic membrane and external ear normal.     Left Ear: Tympanic membrane and external ear normal.     Nose: Rhinorrhea present.     Mouth/Throat:     Mouth: Mucous membranes are moist.     Pharynx: Posterior oropharyngeal erythema present.  Eyes:     Extraocular Movements: Extraocular movements intact.     Conjunctiva/sclera: Conjunctivae normal.  Cardiovascular:     Rate and Rhythm: Normal  rate and regular rhythm.     Heart sounds: Normal heart sounds.  Pulmonary:     Effort: Pulmonary effort is normal.     Breath sounds: Wheezing present. No rales.     Comments: Minimal scattered wheezes bilaterally Musculoskeletal:        General: Normal range of motion.     Cervical back: Normal range of motion and neck supple.  Skin:    General: Skin is warm and dry.  Neurological:     Mental Status: She is alert and oriented to person, place, and time.  Psychiatric:        Mood and Affect: Mood normal.        Thought Content: Thought content normal.   UC Treatments / Results  Labs (all labs ordered are listed, but only abnormal results are displayed) Labs Reviewed - No data to display  EKG   Radiology No results found.  Procedures Procedures (including critical care time)  Medications Ordered in UC Medications - No data to display  Initial Impression / Assessment and Plan / UC Course  I have reviewed the triage vital signs and the nursing notes.  Pertinent labs & imaging results that were available during my care of the patient were reviewed by me and considered in my medical decision making (see chart for details).     Overall vital signs very reassuring today, suspect bronchitis and possibly some underlying seasonal allergies causing persisting symptoms.  We will treat with prednisone, albuterol, Phenergan DM.  Discussed starting a good allergy regimen with Zyrtec and Flonase additionally in case this is perpetuating symptoms.  Return for acutely worsening  symptoms.  Final Clinical Impressions(s) / UC Diagnoses   Final diagnoses:  Acute bronchitis, unspecified organism   Discharge Instructions   None    ED Prescriptions     Medication Sig Dispense Auth. Provider   predniSONE (DELTASONE) 20 MG tablet Take 2 tablets (40 mg total) by mouth daily with breakfast. 10 tablet Volney American, PA-C   albuterol (VENTOLIN HFA) 108 (90 Base) MCG/ACT inhaler Inhale 2 puffs into the lungs every 6 (six) hours as needed for wheezing or shortness of breath. [place], [place]   promethazine-dextromethorphan (PROMETHAZINE-DM) 6.25-15 MG/5ML syrup Take 5 mLs by mouth 4 (four) times daily as needed. 100 mL Volney American, [place]      PDMP not reviewed this encounter.   Volney American, [place] 01/29/21 1652

## 2021-02-04 ENCOUNTER — Encounter: Payer: Self-pay | Admitting: Family Medicine

## 2021-02-04 MED ORDER — DOXYCYCLINE HYCLATE 100 MG PO TABS: 100.0000 mg | ORAL_TABLET | Freq: Two times a day (BID) | ORAL | 0 refills | Status: DC

## 2021-02-04 MED ORDER — VALACYCLOVIR HCL 1 G PO TABS: 1000.0000 mg | ORAL_TABLET | Freq: Three times a day (TID) | ORAL | 0 refills | Status: DC

## 2021-02-04 NOTE — Telephone Encounter (Signed)
Hi Anastasija  It is possible this could be shingles.  It is also possible there could be MRSA causing this.  Valtrex 1 tablet 3 times daily for 7 days will take care of shingles  Doxycycline 1 twice daily for 7 days would cover for the possibility of MRSA  If significantly worse recommend follow-up  Your medication was sent to the [facility] in [place] listed in your chart on [address]. at shadow [facility] things go better both feel free to call to be checked should it get worse thanks-Dr. Nicki Reaper

## 2021-02-22 ENCOUNTER — Ambulatory Visit (INDEPENDENT_AMBULATORY_CARE_PROVIDER_SITE_OTHER): Payer: BC Managed Care – PPO | Admitting: Family Medicine

## 2021-02-22 ENCOUNTER — Telehealth: Payer: Self-pay | Admitting: *Deleted

## 2021-02-22 ENCOUNTER — Other Ambulatory Visit: Payer: Self-pay | Admitting: Family Medicine

## 2021-02-22 ENCOUNTER — Other Ambulatory Visit: Payer: Self-pay

## 2021-02-22 DIAGNOSIS — R059 Cough, unspecified: Secondary | ICD-10-CM | POA: Diagnosis not present

## 2021-02-22 DIAGNOSIS — J019 Acute sinusitis, unspecified: Secondary | ICD-10-CM

## 2021-02-22 DIAGNOSIS — J3089 Other allergic rhinitis: Secondary | ICD-10-CM

## 2021-02-22 MED ORDER — AZELASTINE HCL 0.1 % NA SOLN: 2.0000 | Freq: Two times a day (BID) | NASAL | 12 refills | Status: DC

## 2021-02-22 MED ORDER — CLARITHROMYCIN 500 MG PO TABS: 500.0000 mg | ORAL_TABLET | Freq: Two times a day (BID) | ORAL | 0 refills | Status: DC

## 2021-02-22 NOTE — Telephone Encounter (Signed)
Sandra Trujillo, Sandra Trujillo are scheduled for a virtual visit with your provider today.    Just as we do with appointments in the office, we must obtain your consent to participate.  Your consent will be active for this visit and any virtual visit you may have with one of our providers in the next 365 days.    If you have a MyChart account, I can also send a copy of this consent to you electronically.  All virtual visits are billed to your insurance company just like a traditional visit in the office.  As this is a virtual visit, video technology does not allow for your provider to perform a traditional examination.  This may limit your provider's ability to fully assess your condition.  If your provider identifies any concerns that need to be evaluated in person or the need to arrange testing such as labs, EKG, etc, we will make arrangements to do so.    Although advances in technology are sophisticated, we cannot ensure that it will always work on either your end or our end.  If the connection with a video visit is poor, we may have to switch to a telephone visit.  With either a video or telephone visit, we are not always able to ensure that we have a secure connection.   I need to obtain your verbal consent now.   Are you willing to proceed with your visit today?   Sandra Trujillo has provided verbal consent on 02/22/2021 for a virtual visit (video or telephone).   Ned Card, LPN 10/21/5036  88:28 PM

## 2021-02-22 NOTE — Progress Notes (Signed)
° °  Subjective:    Patient ID: Sandra Trujillo, female    DOB: 03-18-1960, 61 y.o.   MRN: [id number]  HPI Patient following up from Urgent Care for cough and congestion. Patient states still coughing at night, but not as bad as it was. She is having congestion in chest and throat in the morning and voice changes at night.  Virtual Visit via Telephone Note  I connected with Victorino Sparrow Dabbs on 02/22/21 at  2:50 PM EST by telephone and verified that I am speaking with the correct person using two identifiers.  Location: Patient: home Provider: office   I discussed the limitations, risks, security and privacy concerns of performing an evaluation and management service by telephone and the availability of in person appointments. I also discussed with the patient that there may be a patient responsible charge related to this service. The patient expressed understanding and agreed to proceed.   History of Present Illness:    Observations/Objective:   Assessment and Plan:   Follow Up Instructions:    I discussed the assessment and treatment plan with the patient. The patient was provided an opportunity to ask questions and all were answered. The patient agreed with the plan and demonstrated an understanding of the instructions.   The patient was advised to call back or seek an in-person evaluation if the symptoms worsen or if the condition fails to improve as anticipated.  I provided  18minutes of non-face-to-face time during this encounter.     Review of Systems     Objective:   Physical Exam  Today's visit was via telephone Physical exam was not possible for this visit       Assessment & Plan:  Ongoing cough could be related to recent viral illness It could be secondary rhinosinusitis There could also be allergy involved Continue allergy medicine and allergy spray Add Astelin spray In addition to this Biaxin for the next 10 days If not dramatically better within 2  weeks patient is notify us of what we will do is proceed forward with chest x-ray and if ongoing troubles recommend pulmonary function as well as pulmonary consult for possible CT scan Patient was strongly encouraged to quit smoking In addition to this she was counseled regarding smoking plus also CAT scans to screen for lung cancer she will consider this currently

## 2021-02-23 DIAGNOSIS — M62830 Muscle spasm of back: Secondary | ICD-10-CM | POA: Diagnosis not present

## 2021-02-23 DIAGNOSIS — M9903 Segmental and somatic dysfunction of lumbar region: Secondary | ICD-10-CM | POA: Diagnosis not present

## 2021-02-23 DIAGNOSIS — M9901 Segmental and somatic dysfunction of cervical region: Secondary | ICD-10-CM | POA: Diagnosis not present

## 2021-02-23 DIAGNOSIS — M5412 Radiculopathy, cervical region: Secondary | ICD-10-CM | POA: Diagnosis not present

## 2021-02-26 DIAGNOSIS — E213 Hyperparathyroidism, unspecified: Secondary | ICD-10-CM | POA: Diagnosis not present

## 2021-02-28 ENCOUNTER — Other Ambulatory Visit: Payer: Self-pay | Admitting: Surgery

## 2021-02-28 DIAGNOSIS — E213 Hyperparathyroidism, unspecified: Secondary | ICD-10-CM

## 2021-03-01 DIAGNOSIS — R051 Acute cough: Secondary | ICD-10-CM | POA: Diagnosis not present

## 2021-03-01 DIAGNOSIS — Z20822 Contact with and (suspected) exposure to covid-19: Secondary | ICD-10-CM | POA: Diagnosis not present

## 2021-03-05 NOTE — Telephone Encounter (Signed)
error 

## 2021-03-12 ENCOUNTER — Encounter: Payer: Self-pay | Admitting: Family Medicine

## 2021-03-12 DIAGNOSIS — R059 Cough, unspecified: Secondary | ICD-10-CM

## 2021-03-12 NOTE — Telephone Encounter (Signed)
Nurses At this point I would recommend several things 1.-Pulmonary function testing 2.-Consult with pulmonary 3.-CT scan of the chest because of chronic cough with history of smoking  Please see if Fanta is open to pursuing these measures.  I believe these would be reasonable to help pinpoint more so what is going on and to make sure that there is not underlying condition that we are not aware of that is causing this.  If she is open to the above please go ahead with setting these up Thanks-Dr. Nicki Reaper

## 2021-03-13 ENCOUNTER — Encounter (HOSPITAL_COMMUNITY)
Admission: RE | Admit: 2021-03-13 | Discharge: 2021-03-13 | Disposition: A | Discharge status: Home / Self Care | Payer: BC Managed Care – PPO | Source: Ambulatory Visit | Attending: Surgery | Admitting: Surgery

## 2021-03-13 ENCOUNTER — Other Ambulatory Visit: Payer: Self-pay

## 2021-03-13 DIAGNOSIS — E041 Nontoxic single thyroid nodule: Secondary | ICD-10-CM | POA: Diagnosis not present

## 2021-03-13 DIAGNOSIS — E213 Hyperparathyroidism, unspecified: Secondary | ICD-10-CM | POA: Insufficient documentation

## 2021-03-13 MED ORDER — TECHNETIUM TC 99M SESTAMIBI - CARDIOLITE
25.2000 | Freq: Once | INTRAVENOUS | Status: AC | PRN
  Administered 2021-03-13: 08:00:00 25.2 via INTRAVENOUS

## 2021-03-20 ENCOUNTER — Telehealth: Payer: Self-pay | Admitting: Family Medicine

## 2021-03-20 DIAGNOSIS — R059 Cough, unspecified: Secondary | ICD-10-CM

## 2021-03-20 NOTE — Telephone Encounter (Signed)
Left message to return call 

## 2021-03-20 NOTE — Addendum Note (Signed)
Addended by: Dairl Ponder on: 03/20/2021 03:14 PM   Modules accepted: Orders

## 2021-03-20 NOTE — Telephone Encounter (Signed)
Sandra Chancy, MD; Vicente Males, LPN; Dairl Ponder, RN; Anastasio Auerbach R, LPN Good Morning - This Patient's CT was denied by her insurance. The reasoning states " this test should be used when a Chest X-Ray failed to provide a diagnosis. Based on this information, this test is not medically necessary". Please Advise.

## 2021-03-20 NOTE — Telephone Encounter (Signed)
Nurses Please explained to patient that her insurance company requires that she do a chest x-ray first because of chronic coughing then more than likely they would approve the CT scan but without the chest x-ray they will not pursue the CT scan  Therefore go ahead with chest x-ray because of cough as long as patient agrees

## 2021-03-23 DIAGNOSIS — M9901 Segmental and somatic dysfunction of cervical region: Secondary | ICD-10-CM | POA: Diagnosis not present

## 2021-03-23 DIAGNOSIS — M5412 Radiculopathy, cervical region: Secondary | ICD-10-CM | POA: Diagnosis not present

## 2021-03-23 DIAGNOSIS — M9903 Segmental and somatic dysfunction of lumbar region: Secondary | ICD-10-CM | POA: Diagnosis not present

## 2021-03-23 DIAGNOSIS — M9905 Segmental and somatic dysfunction of pelvic region: Secondary | ICD-10-CM | POA: Diagnosis not present

## 2021-03-23 DIAGNOSIS — M955 Acquired deformity of pelvis: Secondary | ICD-10-CM | POA: Diagnosis not present

## 2021-03-23 DIAGNOSIS — M62830 Muscle spasm of back: Secondary | ICD-10-CM | POA: Diagnosis not present

## 2021-03-26 NOTE — Progress Notes (Signed)
USN and sestamibi both suggest the presence of a left parathyroid adenoma, though neither are definitive.  I would like to obtain a 4D-CT scan of the neck to confirm prior to recommending surgery.  Claiborne Billings - please schedule 4D-CT scan with parathyroid protocol.  I will contact the patient when the results are available.  [place], MD [facility] [place] [facility] Office: [phone]

## 2021-03-26 NOTE — Progress Notes (Signed)
USN and sestamibi both suggest the presence of a left parathyroid adenoma, though neither are definitive.  I would like to obtain a 4D-CT scan of the neck to confirm prior to recommending surgery.  Claiborne Billings - please schedule 4D-CT scan with parathyroid protocol.  I will contact the patient when the results are available.  [place], MD [facility] [place] [facility] Office: [phone]

## 2021-03-28 ENCOUNTER — Ambulatory Visit: Payer: BC Managed Care – PPO | Admitting: "Endocrinology

## 2021-03-29 ENCOUNTER — Other Ambulatory Visit: Payer: Self-pay | Admitting: Surgery

## 2021-03-29 DIAGNOSIS — E213 Hyperparathyroidism, unspecified: Secondary | ICD-10-CM

## 2021-03-29 NOTE — Addendum Note (Signed)
Addended by: Vicente Males on: 03/29/2021 04:53 PM   Modules accepted: Orders

## 2021-03-29 NOTE — Telephone Encounter (Signed)
Left message to return call. Sent my chart message

## 2021-03-29 NOTE — Telephone Encounter (Signed)
Pt replied via my chart. Pt is OK with chest xray. Chest xray ordered.

## 2021-04-01 ENCOUNTER — Other Ambulatory Visit: Payer: Self-pay

## 2021-04-01 ENCOUNTER — Ambulatory Visit (HOSPITAL_COMMUNITY)
Admission: RE | Admit: 2021-04-01 | Discharge: 2021-04-01 | Disposition: A | Discharge status: Home / Self Care | Payer: BC Managed Care – PPO | Source: Ambulatory Visit | Attending: Family Medicine | Admitting: Family Medicine

## 2021-04-01 DIAGNOSIS — R0602 Shortness of breath: Secondary | ICD-10-CM | POA: Diagnosis not present

## 2021-04-01 DIAGNOSIS — R059 Cough, unspecified: Secondary | ICD-10-CM | POA: Diagnosis not present

## 2021-04-08 ENCOUNTER — Other Ambulatory Visit: Payer: Self-pay | Admitting: Family Medicine

## 2021-04-20 DIAGNOSIS — M5136 Other intervertebral disc degeneration, lumbar region: Secondary | ICD-10-CM | POA: Diagnosis not present

## 2021-04-20 DIAGNOSIS — M5412 Radiculopathy, cervical region: Secondary | ICD-10-CM | POA: Diagnosis not present

## 2021-04-20 DIAGNOSIS — M62830 Muscle spasm of back: Secondary | ICD-10-CM | POA: Diagnosis not present

## 2021-04-20 DIAGNOSIS — M9905 Segmental and somatic dysfunction of pelvic region: Secondary | ICD-10-CM | POA: Diagnosis not present

## 2021-04-20 DIAGNOSIS — M9901 Segmental and somatic dysfunction of cervical region: Secondary | ICD-10-CM | POA: Diagnosis not present

## 2021-04-20 DIAGNOSIS — M9903 Segmental and somatic dysfunction of lumbar region: Secondary | ICD-10-CM | POA: Diagnosis not present

## 2021-04-20 DIAGNOSIS — M955 Acquired deformity of pelvis: Secondary | ICD-10-CM | POA: Diagnosis not present

## 2021-04-30 ENCOUNTER — Encounter: Payer: Self-pay | Admitting: Internal Medicine

## 2021-04-30 ENCOUNTER — Ambulatory Visit: Payer: BC Managed Care – PPO | Admitting: Internal Medicine

## 2021-04-30 ENCOUNTER — Other Ambulatory Visit: Payer: Self-pay

## 2021-04-30 VITALS — BP 148/84 | HR 74 | Temp 98.2°F | Ht 64.0 in | Wt 191.0 lb

## 2021-04-30 DIAGNOSIS — R058 Other specified cough: Secondary | ICD-10-CM | POA: Diagnosis not present

## 2021-04-30 NOTE — Assessment & Plan Note (Signed)
Active smoker ?- Onset dec 2022 with bad virus some better with pred/albuterol ?- 04/30/2021  After extensive coaching inhaler device,  effectiveness =    90% > continue prn saba and add max rx for pnds/gerd with f/u pfts ordered  ? ?ddx is between cough variant asthma vs Upper airway cough syndrome (previously labeled PNDS),  is so named because it's frequently impossible to sort out how much is  CR/sinusitis with freq throat clearing (which can be related to primary GERD)   vs  causing  secondary (" extra esophageal")  GERD from wide swings in gastric pressure that occur with throat clearing, often  promoting self use of mint and menthol lozenges that reduce the lower esophageal sphincter tone and exacerbate the problem further in a cyclical fashion.  ? ?These are the same pts (now being labeled as having "irritable larynx syndrome" by some cough centers) who not infrequently have a history of having failed to tolerate ace inhibitors,  dry powder inhalers or biphosphonates or report having atypical/extraesophageal reflux symptoms that don't respond to standard doses of PPI  and are easily confused as having aecopd or asthma flares by even experienced allergists/ pulmonologists (myself included).  ? ?Of the three most common causes of  Sub-acute / recurrent or chronic cough, only one (GERD)  can actually contribute to/ trigger  the other two (asthma and post nasal drip syndrome)  and perpetuate the cylce of cough. ? ?While not intuitively obvious, many patients with chronic low grade reflux do not cough until there is a primary insult that disturbs the protective epithelial barrier and exposes sensitive nerve endings.   This is typically viral but can due to PNDS and  either may apply here.  ? ?>>> The point is that once this occurs, it is difficult to eliminate the cycle  using anything but a maximally effective acid suppression regimen at least in the short run, accompanied by an appropriate diet to address non  acid GERD and control / eliminate pnds with 1st gen H1 blockers per guidelines. ? ?If not improving can try on symbicor 80 2bid while waiting on pfts  ? ?    ?  ? ?Each maintenance medication was reviewed in detail including emphasizing most importantly the difference between maintenance and prns and under what circumstances the prns are to be triggered using an action plan format where appropriate. ? ?Total time for H and P, chart review, counseling, reviewing hfa device(s) and generating customized AVS unique to this office visit / same day charting=   45 min  ?     ?

## 2021-04-30 NOTE — Progress Notes (Signed)
? ?Sandra Trujillo, female    DOB: [birth year],    MRN: [id number] ? ? 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 ? ?Eval virtual visits rx biaxin/astelin  ? ? ?History of Present Illness  ?04/30/2021  Pulmonary/ 1st office eval/ [facility] Novas / [facility]  ?Chief Complaint  ?Patient presents with  ? Consult  ?  Ref by Dr. Wolfgang Phoenix for cough that has been present form Dec 2022-Jan 2023. Intermittent cough after that. "Comes and goes" per patient.   ?Dyspnea:  still able to ex with trainer on treadmill x 6 min x half a mile flat grade ?Cough: at hs with pnds s production  ?Sleep: better now once gets to sleep on  ?SABA use: albuterol per UC helps some / prednisone also heled some ?Has gerd only taking ppi qd ? ?No obvious other patterns in day to day or daytime variability or assoc excess/ purulent sputum or mucus plugs or hemoptysis or cp or chest tightness, subjective wheeze or overt sinus or hb symptoms.  ? ?Sleeping (once asleep) without nocturnal  or early am exacerbation  of respiratory  c/o's or need for noct saba. Also denies any obvious fluctuation of symptoms with weather or environmental changes or other aggravating or alleviating factors except as outlined above  ? ?No unusual exposure hx or h/o childhood pna/ asthma or knowledge of premature birth. ? ?Current Allergies, Complete Past Medical History, Past Surgical History, Family History, and Social History were reviewed in [employer] record. ? ?ROS  The following are not active complaints unless bolded ?Hoarseness, sore throat, dysphagia, dental problems, itching, sneezing,  nasal congestion or discharge of excess mucus or purulent secretions, ear ache,   fever, chills, sweats, unintended wt loss or wt gain, classically pleuritic or exertional cp,   orthopnea pnd or arm/hand swelling  or leg swelling, presyncope, palpitations, abdominal pain, anorexia, nausea, vomiting, diarrhea  or change in bowel habits or change in bladder habits, change in stools or change in urine, dysuria, hematuria,  rash, arthralgias, visual complaints, headache, numbness, weakness or ataxia or problems with walking or coordination,  change in mood or  memory. ?      ?   ? ?  ? ?Past Medical History:  ?Diagnosis Date  ? GERD (gastroesophageal reflux disease)   ? Hyperlipidemia   ? Hypertension   ? ? ?Outpatient Medications Prior to Visit  ?Medication Sig Dispense Refill  ? albuterol (VENTOLIN HFA) 108 (90 Base) MCG/ACT inhaler Inhale 2 puffs into the lungs every 6 (six) hours as needed for wheezing or shortness of breath. 18 g 0  ? azelastine (ASTELIN) 0.1 % nasal spray Place 2 sprays into both nostrils 2 (two) times daily. 30 mL 12  ? Cholecalciferol (VITAMIN D) 2000 UNITS CAPS Take 1 capsule by mouth daily.    ? clarithromycin (BIAXIN) 500 MG tablet Take 1 tablet (500 mg total) by mouth 2 (two) times daily. 20 tablet 0  ? clobetasol ointment (TEMOVATE) 0.05 % Apply topically 2 (two) times daily. (Patient not taking: Reported on 02/22/2021)    ? docusate sodium (COLACE) 100 MG capsule Take 100 mg by mouth daily.    ? fluticasone (FLONASE) 50 MCG/ACT nasal spray SHAKE LIQUID AND USE 2 SPRAYS IN EACH NOSTRIL DAILY 48 g 0  ?  indapamide (LOZOL) 2.5 MG tablet TAKE 1 TABLET(2.5 MG) BY MOUTH DAILY 90 tablet 2  ? losartan (COZAAR) 100 MG tablet TAKE 1 TABLET BY MOUTH EVERY DAY 90 tablet 2  ? pantoprazole (PROTONIX) 40 MG tablet TAKE 1 TABLET(40 MG) BY MOUTH TWICE DAILY BEFORE A MEAL 180 tablet 1  ? rosuvastatin (CRESTOR) 40 MG tablet Take 1 tablet (40 mg total) by mouth daily. 90 tablet 3  ? ?No facility-administered medications prior to visit.  ? ? ? ?Objective:  ?  ? ?BP (!) 148/84 (BP Location: Left Arm, Patient Position: Sitting)   Pulse 74   Temp 98.2 ?F (36.8 ?C) (Temporal)   Ht '5\' 4"'$   (1.626 m)   Wt 191 lb (86.6 kg)   SpO2 99% Comment: ra  BMI 32.79 kg/m?  ? ?SpO2: 99 % (ra) ? ? HEENT : pt wearing mask not removed for exam due to covid -19 concerns.  ? ? ?NECK :  without JVD/Nodes/TM/ nl carotid upstrokes bilaterally ? ? ?LUNGS: no acc muscle use,  Nl contour chest which is clear to A and P bilaterally without cough on insp or exp maneuvers ? ? ?CV:  RRR  no s3 or murmur or increase in P2, and no edema  ? ?ABD:  soft and nontender with nl inspiratory excursion in the supine position. No bruits or organomegaly appreciated, bowel sounds nl ? ?MS:  Nl gait/ ext warm without deformities, calf tenderness, cyanosis or clubbing ?No obvious joint restrictions  ? ?SKIN: warm and dry without lesions   ? ?NEURO:  alert, approp, nl sensorium with  no motor or cerebellar deficits apparent.  ? ? ? ?I personally reviewed images and agree with radiology impression as follows:  ?CXR:   04/01/21  ?No radiographic evidence of acute cardiopulmonary disease. ? ? ? ?   ?Assessment  ? ?Upper airway cough syndrome ?Active smoker ?- Onset dec 2022 with bad virus some better with pred/albuterol ?- 04/30/2021  After extensive coaching inhaler device,  effectiveness =    90% > continue prn saba and add max rx for pnds/gerd with f/u pfts ordered  ? ?ddx is between cough variant asthma vs Upper airway cough syndrome (previously labeled PNDS),  is so named because it's frequently impossible to sort out how much is  CR/sinusitis with freq throat clearing (which can be related to primary GERD)   vs  causing  secondary (" extra esophageal")  GERD from wide swings in gastric pressure that occur with throat clearing, often  promoting self use of mint and menthol lozenges that reduce the lower esophageal sphincter tone and exacerbate the problem further in a cyclical fashion.  ? ?These are the same pts (now being labeled as having "irritable larynx syndrome" by some cough centers) who not infrequently have a history of having  failed to tolerate ace inhibitors,  dry powder inhalers or biphosphonates or report having atypical/extraesophageal reflux symptoms that don't respond to standard doses of PPI  and are easily confused as having aecopd or asthma flares by even experienced allergists/ pulmonologists (myself included).  ? ?Of the three most common causes of  Sub-acute / recurrent or chronic cough, only one (GERD)  can actually contribute to/ trigger  the other two (asthma and post nasal drip syndrome)  and perpetuate the cylce of cough. ? ?While not intuitively obvious, many patients with chronic low grade reflux do not cough until there is a primary insult that disturbs the protective epithelial barrier and exposes sensitive nerve endings.  This is typically viral but can due to PNDS and  either may apply here.  ? ?>>> The point is that once this occurs, it is difficult to eliminate the cycle  using anything but a maximally effective acid suppression regimen at least in the short run, accompanied by an appropriate diet to address non acid GERD and control / eliminate pnds with 1st gen H1 blockers per guidelines. ? ?If not improving can try on symbicor 80 2bid while waiting on pfts  ? ?    ?  ? ?Each maintenance medication was reviewed in detail including emphasizing most importantly the difference between maintenance and prns and under what circumstances the prns are to be triggered using an action plan format where appropriate. ? ?Total time for H and P, chart review, counseling, reviewing hfa device(s) and generating customized AVS unique to this office visit / same day charting=   45 min  ?     ? ? ? ? ?Christinia Gully, MD ?04/30/2021 ?   ?

## 2021-04-30 NOTE — Patient Instructions (Addendum)
Change protonix to 40 mg Take 30- 60 min before your first and last meals of the day as long as your are coughing then ok resume previous dosing. ? ? ?GERD (REFLUX)  is an extremely common cause of respiratory symptoms just like yours , many times with no obvious heartburn at all.  ? ? It can be treated with medication, but also with lifestyle changes including elevation of the head of your bed (ideally with 6 -8inch blocks under the headboard of your bed),  Smoking cessation, avoidance of late meals, excessive alcohol, and avoid fatty foods, chocolate, peppermint, colas, red wine, and acidic juices such as orange juice.  ?NO MINT OR MENTHOL PRODUCTS SO NO COUGH DROPS  NO SPEARMINT ?USE SUGARLESS CANDY INSTEAD (Jolley ranchers or Stover's or Life Savers) or even ice chips will also do - the key is to swallow to prevent all throat clearing. ?NO OIL BASED VITAMINS - use powdered substitutes.  Avoid fish oil when coughing.  ? ? ?For drainage / throat tickle try take CHLORPHENIRAMINE  4 mg  ("Allergy Relief" '4mg'$   at [facility] should be easiest to find in the blue box usually on bottom shelf)  take one every 4 hours as needed - extremely effective and inexpensive over the counter- may cause drowsiness so start with just a dose or two an hour before bedtime and see how you tolerate it before trying in daytime.  ? ? Only use your albuterol as a rescue medication to be used if you can't catch your breath by resting or doing a relaxed purse lip breathing pattern.  ?- The less you use it, the better it will work when you need it. ?- Ok to use up to 2 puffs  every 4 hours if you must but call for immediate appointment if use goes up over your usual need ?- Don't leave home without it !!  (think of it like the spare tire for your car)  ? ?Ok to try albuterol 15 min before an activity (on alternating days)  that you know would usually make you short of breath and see if it makes any difference and if makes none then  don't take albuterol after activity unless you can't catch your breath as this means it's the resting that helps, not the albuterol. ? ?If not happy next step is symbicort 80 Take 2 puffs first thing in am and then another 2 puffs about 12 hours later.  ? ?The key is to stop smoking completely before smoking completely stops you and discuss screening low dose CT scan with Dr Wolfgang Phoenix  ? ?  ? ?Schedule PFTs and I will call you the results ? ? ?      ? ?  ? ?

## 2021-05-01 ENCOUNTER — Ambulatory Visit
Admission: RE | Admit: 2021-05-01 | Discharge: 2021-05-01 | Disposition: A | Discharge status: Home / Self Care | Payer: BC Managed Care – PPO | Source: Ambulatory Visit | Attending: Surgery | Admitting: Surgery

## 2021-05-01 ENCOUNTER — Other Ambulatory Visit: Payer: BC Managed Care – PPO

## 2021-05-01 DIAGNOSIS — E213 Hyperparathyroidism, unspecified: Secondary | ICD-10-CM

## 2021-05-01 MED ORDER — IOPAMIDOL (ISOVUE-300) INJECTION 61%
75.0000 mL | Freq: Once | INTRAVENOUS | Status: AC | PRN
  Administered 2021-05-01: 75 mL via INTRAVENOUS

## 2021-05-03 ENCOUNTER — Telehealth: Payer: Self-pay | Admitting: Family Medicine

## 2021-05-03 NOTE — Telephone Encounter (Signed)
Sent my chart message to schedule appointment 05/03/21 ?

## 2021-05-03 NOTE — Telephone Encounter (Signed)
Please contact pt to have her set up appt for follow up on HTN. Then may route back to nurses. Thank you ?

## 2021-05-06 ENCOUNTER — Ambulatory Visit: Payer: Self-pay | Admitting: Surgery

## 2021-05-06 NOTE — Progress Notes (Signed)
USN, sestamibi, and CT scans all identify possible parathyroid adenomas at the inferior poles of the thyroid, both on the left and on there right.  Patient will need exploration with parathyroidectomy.  Will plan to look at both the right and left lower areas at the time of surgery.  Still will be an outpatient procedure at this point. ? ?Will enter orders and send to schedulers to contact patient. ? ?tmg ? ?Armandina Gemma, MD ?[facility] ?A [facility] practice ?Office: [phone] ?

## 2021-05-14 ENCOUNTER — Ambulatory Visit: Payer: BC Managed Care – PPO | Admitting: Family Medicine

## 2021-05-14 ENCOUNTER — Encounter: Payer: Self-pay | Admitting: Family Medicine

## 2021-05-14 VITALS — BP 146/91 | HR 79 | Temp 97.7°F | Wt 188.2 lb

## 2021-05-14 DIAGNOSIS — I10 Essential (primary) hypertension: Secondary | ICD-10-CM

## 2021-05-14 DIAGNOSIS — K219 Gastro-esophageal reflux disease without esophagitis: Secondary | ICD-10-CM | POA: Diagnosis not present

## 2021-05-14 DIAGNOSIS — E785 Hyperlipidemia, unspecified: Secondary | ICD-10-CM | POA: Diagnosis not present

## 2021-05-14 DIAGNOSIS — N183 Chronic kidney disease, stage 3 unspecified: Secondary | ICD-10-CM | POA: Insufficient documentation

## 2021-05-14 MED ORDER — AZELASTINE HCL 0.1 % NA SOLN
2.0000 | Freq: Two times a day (BID) | NASAL | 12 refills | Status: DC
Start: 2021-05-14 — End: 2021-12-09

## 2021-05-14 MED ORDER — ROSUVASTATIN CALCIUM 40 MG PO TABS: 40.0000 mg | ORAL_TABLET | Freq: Every day | ORAL | 3 refills | Status: DC

## 2021-05-14 MED ORDER — PANTOPRAZOLE SODIUM 40 MG PO TBEC: 40.0000 mg | DELAYED_RELEASE_TABLET | Freq: Two times a day (BID) | ORAL | 3 refills | Status: DC

## 2021-05-14 MED ORDER — INDAPAMIDE 2.5 MG PO TABS: ORAL_TABLET | ORAL | 3 refills | Status: DC

## 2021-05-14 MED ORDER — FLUTICASONE PROPIONATE 50 MCG/ACT NA SUSP: NASAL | 6 refills | Status: DC

## 2021-05-14 MED ORDER — LOSARTAN POTASSIUM 100 MG PO TABS
ORAL_TABLET | ORAL | 3 refills | Status: DC
Start: 2021-05-14 — End: 2022-05-07

## 2021-05-14 NOTE — Assessment & Plan Note (Signed)
BP elevated today as she has not taken her medication.  Continue losartan and indapamide.  Refilled today. ?

## 2021-05-14 NOTE — Assessment & Plan Note (Signed)
Stable.  Continue Crestor.  Refilled today. ?

## 2021-05-14 NOTE — Patient Instructions (Signed)
Continue your medications. ? ?Follow up in 6 months with Dr. Nicki Reaper. ? ?Best of luck on the upcoming surgery. ? ?Take care ? ?Dr. Lacinda Axon  ?

## 2021-05-14 NOTE — Progress Notes (Signed)
? ?Subjective:  ?Patient ID: Sandra Trujillo, female    DOB: 1960/05/14  Age: 61 y.o. MRN: [id number] ? ?CC: ?Chief Complaint  ?Patient presents with  ? Hypertension  ?  Pt needing refill on chronic meds. Mikal Plane is managed by another provider.   ? ? ?HPI: ? ?61 year old female with hypertension, history of peptic ulcer disease, hyperparathyroidism with upcoming surgery for parathyroidectomy, hyperlipidemia, tobacco abuse presents for follow-up. ? ?Patient was encouraged to come in for follow-up regarding hypertension.  She is in need of medication refill. ? ?Blood pressure mildly elevated today.  Patient states that she has not taken her medication today.  She is currently on indapamide and losartan.  Needs refills. ? ?Last lipid panel revealed LDL of 93.  She is currently on Crestor and tolerating without difficulty. ? ?Patient has GERD and history of peptic ulcer disease.  Stable on Protonix.  Needs refill. ? ? ?Patient Active Problem List  ? Diagnosis Date Noted  ? CKD (chronic kidney disease) stage 3, GFR 30-59 ml/min (HCC) 05/14/2021  ? Upper airway cough syndrome 04/30/2021  ? Hyperparathyroidism, primary ([place]) 02/23/2020  ? PUD (peptic ulcer disease) 03/19/2015  ? GERD (gastroesophageal reflux disease) 08/14/2014  ? Vitamin D deficiency 05/31/2014  ? Multiple thyroid nodules 05/22/2014  ? Essential hypertension, benign 12/16/2012  ? Hyperlipidemia 12/16/2012  ? History of tobacco abuse 12/16/2012  ? ? ?Social Hx   ?Social History  ? ?Socioeconomic History  ? Marital status: Married  ?  Spouse name: Not on file  ? Number of children: 1  ? Years of education: Not on file  ? Highest education level: Not on file  ?Occupational History  ? Occupation: [employer]  ?Tobacco Use  ? Smoking status: Every Day  ?  Packs/day: 1.00  ?  Years: 25.00  ?  Pack years: 25.00  ?  Types: Cigarettes  ? Smokeless tobacco: Never  ? Tobacco comments:  ?  1/2 ppd per patient 04/30/2021 MR   ?Vaping Use  ? Vaping Use: Never used  ?Substance and  Sexual Activity  ? Alcohol use: No  ?  Alcohol/week: 0.0 standard drinks  ? Drug use: No  ? Sexual activity: Not on file  ?Other Topics Concern  ? Not on file  ?Social History Narrative  ? Not on file  ? ?Social Determinants of Health  ? ?Financial Resource Strain: Not on file  ?Food Insecurity: Not on file  ?Transportation Needs: Not on file  ?Physical Activity: Not on file  ?Stress: Not on file  ?Social Connections: Not on file  ? ? ?Review of Systems  ?Constitutional: Negative.   ?Respiratory: Negative.    ?Cardiovascular: Negative.   ? ? ?Objective:  ?BP (!) 146/91   Pulse 79   Temp 97.7 ?F (36.5 ?C)   Wt 188 lb 3.2 oz (85.4 kg)   SpO2 98%   BMI 32.30 kg/m?  ? ? ?  05/14/2021  ?  1:09 PM 04/30/2021  ?  2:58 PM 01/29/2021  ?  3:46 PM  ?BP/Weight  ?Systolic BP 448 185 631  ?Diastolic BP 91 84 90  ?Wt. (Lbs) 188.2 191   ?BMI 32.3 kg/m2 32.79 kg/m2   ? ? ?Physical Exam ?Vitals and nursing note reviewed.  ?Constitutional:   ?   General: She is not in acute distress. ?   Appearance: Normal appearance.  ?HENT:  ?   Head: Normocephalic and atraumatic.  ?Cardiovascular:  ?   Rate and Rhythm: Normal rate and regular rhythm.  ?  Pulmonary:  ?   Effort: Pulmonary effort is normal.  ?   Breath sounds: Normal breath sounds. No wheezing, rhonchi or rales.  ?Neurological:  ?   Mental Status: She is alert.  ?Psychiatric:     ?   Mood and Affect: Mood normal.     ?   Behavior: Behavior normal.  ? ? ? ? ?Assessment & Plan:  ? ?Problem List Items Addressed This Visit   ? ?  ? Cardiovascular and Mediastinum  ? Essential hypertension, benign - Primary  ?  BP elevated today as she has not taken her medication.  Continue losartan and indapamide.  Refilled today. ?  ?  ? Relevant Medications  ? indapamide (LOZOL) 2.5 MG tablet  ? losartan (COZAAR) 100 MG tablet  ? rosuvastatin (CRESTOR) 40 MG tablet  ?  ? Digestive  ? GERD (gastroesophageal reflux disease)  ?  Stable.  Continue Protonix. ?  ?  ? Relevant Medications  ? pantoprazole  (PROTONIX) 40 MG tablet  ?  ? Other  ? Hyperlipidemia  ?  Stable.  Continue Crestor.  Refilled today. ?  ?  ? Relevant Medications  ? indapamide (LOZOL) 2.5 MG tablet  ? losartan (COZAAR) 100 MG tablet  ? rosuvastatin (CRESTOR) 40 MG tablet  ? ? ?Meds ordered this encounter  ?Medications  ? azelastine (ASTELIN) 0.1 % nasal spray  ?  Sig: Place 2 sprays into both nostrils 2 (two) times daily.  ?  Dispense:  30 mL  ?  Refill:  12  ? fluticasone (FLONASE) 50 MCG/ACT nasal spray  ?  Sig: SHAKE LIQUID AND USE 2 SPRAYS IN EACH NOSTRIL DAILY  ?  Dispense:  16 g  ?  Refill:  6  ? indapamide (LOZOL) 2.5 MG tablet  ?  Sig: TAKE 1 TABLET(2.5 MG) BY MOUTH DAILY  ?  Dispense:  90 tablet  ?  Refill:  3  ? losartan (COZAAR) 100 MG tablet  ?  Sig: TAKE 1 TABLET BY MOUTH EVERY DAY  ?  Dispense:  90 tablet  ?  Refill:  3  ? pantoprazole (PROTONIX) 40 MG tablet  ?  Sig: Take 1 tablet (40 mg total) by mouth 2 (two) times daily before a meal.  ?  Dispense:  180 tablet  ?  Refill:  3  ? rosuvastatin (CRESTOR) 40 MG tablet  ?  Sig: Take 1 tablet (40 mg total) by mouth daily.  ?  Dispense:  90 tablet  ?  Refill:  3  ? ? ?Follow-up:  Return in about 6 months (around 11/14/2021). ? ?Thersa Salt DO ?[place] ? ?

## 2021-05-14 NOTE — Assessment & Plan Note (Signed)
Stable ?Continue Protonix ?

## 2021-05-15 NOTE — Telephone Encounter (Signed)
Patient had appointment on 05/14/21 ?

## 2021-05-18 DIAGNOSIS — M9901 Segmental and somatic dysfunction of cervical region: Secondary | ICD-10-CM | POA: Diagnosis not present

## 2021-05-18 DIAGNOSIS — M9903 Segmental and somatic dysfunction of lumbar region: Secondary | ICD-10-CM | POA: Diagnosis not present

## 2021-05-18 DIAGNOSIS — M5412 Radiculopathy, cervical region: Secondary | ICD-10-CM | POA: Diagnosis not present

## 2021-05-18 DIAGNOSIS — M955 Acquired deformity of pelvis: Secondary | ICD-10-CM | POA: Diagnosis not present

## 2021-05-18 DIAGNOSIS — M9905 Segmental and somatic dysfunction of pelvic region: Secondary | ICD-10-CM | POA: Diagnosis not present

## 2021-05-18 DIAGNOSIS — M62830 Muscle spasm of back: Secondary | ICD-10-CM | POA: Diagnosis not present

## 2021-05-18 DIAGNOSIS — M5136 Other intervertebral disc degeneration, lumbar region: Secondary | ICD-10-CM | POA: Diagnosis not present

## 2021-05-20 NOTE — Telephone Encounter (Signed)
Patient had medication follow up with Dr. Lacinda Axon on 05/14/21 ?

## 2021-05-28 ENCOUNTER — Ambulatory Visit (INDEPENDENT_AMBULATORY_CARE_PROVIDER_SITE_OTHER): Payer: BC Managed Care – PPO | Admitting: Internal Medicine

## 2021-05-28 DIAGNOSIS — R058 Other specified cough: Secondary | ICD-10-CM | POA: Diagnosis not present

## 2021-05-28 LAB — PULMONARY FUNCTION TEST
DL/VA % pred: 115 %
DL/VA: 4.89 ml/min/mmHg/L
DLCO cor % pred: 115 %
DLCO cor: 23.54 ml/min/mmHg
DLCO unc % pred: 115 %
DLCO unc: 23.54 ml/min/mmHg
FEF 25-75 Post: 3.35 L/sec
FEF 25-75 Pre: 2.59 L/sec
FEF2575-%Change-Post: 29 %
FEF2575-%Pred-Post: 155 %
FEF2575-%Pred-Pre: 120 %
FEV1-%Change-Post: 7 %
FEV1-%Pred-Post: 113 %
FEV1-%Pred-Pre: 106 %
FEV1-Post: 2.44 L
FEV1-Pre: 2.28 L
FEV1FVC-%Change-Post: 0 %
FEV1FVC-%Pred-Pre: 105 %
FEV6-%Change-Post: 7 %
FEV6-%Pred-Post: 109 %
FEV6-%Pred-Pre: 102 %
FEV6-Post: 2.88 L
FEV6-Pre: 2.69 L
FEV6FVC-%Pred-Post: 103 %
FEV6FVC-%Pred-Pre: 103 %
FVC-%Change-Post: 7 %
FVC-%Pred-Post: 106 %
FVC-%Pred-Pre: 99 %
FVC-Post: 2.88 L
FVC-Pre: 2.69 L
Post FEV1/FVC ratio: 85 %
Post FEV6/FVC ratio: 100 %
Pre FEV1/FVC ratio: 85 %
Pre FEV6/FVC Ratio: 100 %
RV % pred: 90 %
RV: 1.76 L
TLC % pred: 95 %
TLC: 4.83 L

## 2021-05-28 NOTE — Patient Instructions (Signed)
Full PFT performed today. °

## 2021-05-28 NOTE — Progress Notes (Signed)
Full PFT performed today. °

## 2021-06-12 NOTE — Patient Instructions (Addendum)
DUE TO COVID-19 ONLY TWO VISITORS  (aged 59 and older)  IS ALLOWED TO COME WITH YOU AND STAY IN THE WAITING ROOM ONLY DURING PRE OP AND PROCEDURE.   ?**NO VISITORS ARE ALLOWED IN THE SHORT STAY AREA OR RECOVERY ROOM!!** ? ?You are not required to quarantine ?Hand Hygiene often ?Do NOT share personal items ?Notify your provider if you are in close contact with someone who has COVID or you develop fever 100.4 or greater, new onset of sneezing, cough, sore throat, shortness of breath or body aches. ? ?     ? Your procedure is scheduled on: 06-28-21 ? ? Report to [facility] Main Entrance ? ?  Report to admitting at 5:15 AM ? ? Call this number if you have problems the morning of surgery [phone] ? ? Do not eat food :After Midnight. ? ? After Midnight you may have the following liquids until 4:30 AM DAY OF SURGERY ? ?Water ?Black Coffee (sugar ok, NO MILK/CREAM OR CREAMERS)  ?Tea (sugar ok, NO MILK/CREAM OR CREAMERS) regular and decaf                             ?Plain Jell-O (NO RED)                                           ?Fruit ices (not with fruit pulp, NO RED)                                     ?Popsicles (NO RED)                                                                  ?Juice: apple, WHITE grape, WHITE cranberry ?Sports drinks like Gatorade (NO RED) ?Clear broth(vegetable,chicken,beef) ? ?             ?FOLLOW ANY ADDITIONAL PRE OP INSTRUCTIONS YOU RECEIVED FROM YOUR SURGEON'S OFFICE!!! ?  ?  ?Oral Hygiene is also important to reduce your risk of infection.                                    ?Remember - BRUSH YOUR TEETH THE MORNING OF SURGERY WITH YOUR REGULAR TOOTHPASTE ? ? Do NOT smoke after Midnight ? ? Take these medicines the morning of surgery with A SIP OF WATER:  Pantoprazole, Rosuvastatin.  Okay to use inhalers and nasal spray ?                 ?           You may not have any metal on your body including hair pins, jewelry, and body piercing ? ?           Do not wear make-up,  lotions, powders, perfumes or deodorant ? ?Do not wear nail polish including gel and S&S, artificial/acrylic nails, or any other type of covering on natural nails including finger and toenails. If you have artificial nails,  gel coating, etc. that needs to be removed by a nail salon please have this removed prior to surgery or surgery may need to be canceled/ delayed if the surgeon/ anesthesia feels like they are unable to be safely monitored.  ? ?Do not shave  48 hours prior to surgery.  ? ? Do not bring valuables to the hospital. [place]. ? ? Contacts, dentures or bridgework may not be worn into surgery. ? ?Patients discharged on the day of surgery will not be allowed to drive home.  Someone NEEDS to stay with you for the first 24 hours after anesthesia. ? ?Please read over the following fact sheets you were given: IF [place]  ? ?[place] - Preparing for Surgery ?Before surgery, you can play an important role.  Because skin is not sterile, your skin needs to be as free of germs as possible.  You can reduce the number of germs on your skin by washing with CHG (chlorahexidine gluconate) soap before surgery.  CHG is an antiseptic cleaner which kills germs and bonds with the skin to continue killing germs even after washing. ?Please DO NOT use if you have an allergy to CHG or antibacterial soaps.  If your skin becomes reddened/irritated stop using the CHG and inform your nurse when you arrive at Short Stay. ?Do not shave (including legs and underarms) for at least 48 hours prior to the first CHG shower.  You may shave your face/neck. ? ?Please follow these instructions carefully: ? 1.  Shower with CHG Soap the night before surgery and the  morning of surgery. ? 2.  If you choose to wash your hair, wash your hair first as usual with your normal  shampoo. ? 3.  After you shampoo, rinse your hair and body  thoroughly to remove the shampoo.                            ? 4.  Use CHG as you would any other liquid soap.  You can apply chg directly to the skin and wash.  Gently with a scrungie or clean washcloth. ? 5.  Apply the CHG Soap to your body ONLY FROM THE NECK DOWN.   Do   not use on face/ open      ?                     Wound or open sores. Avoid contact with eyes, ears mouth and   genitals (private parts).  ?                     Production manager,  Genitals (private parts) with your normal soap. ?            6.  Wash thoroughly, paying special attention to the area where your    surgery  will be performed. ? 7.  Thoroughly rinse your body with warm water from the neck down. ? 8.  DO NOT shower/wash with your normal soap after using and rinsing off the CHG Soap. ?               9.  Pat yourself dry with a clean towel. ?           10.  Wear clean pajamas. ?           11.  Place clean sheets on your bed the night of your first shower and do not  sleep with pets. ?Day of Surgery : ?Do not apply any lotions/deodorants the morning of surgery.  Please wear clean clothes to the hospital/surgery center. ? ?FAILURE TO FOLLOW THESE INSTRUCTIONS MAY RESULT IN THE CANCELLATION OF YOUR SURGERY ? ?PATIENT SIGNATURE_________________________________ ? ?NURSE SIGNATURE__________________________________ ? ?________________________________________________________________________  ?  ?

## 2021-06-12 NOTE — Progress Notes (Addendum)
COVID Vaccine Completed:  Yes x2 ?Date COVID Vaccine completed: 03-10-19 03-30-19  ?Has received booster:  Yes x11-3-22  ?COVID vaccine manufacturer: Pfizer     ? ?Date of COVID positive in last 90 days:  No ? ?PCP - Sallee Lange, MD ?Cardiologist - N/A ?Pulmonologist - Christinia Gully, MD ? ?Chest x-ray - 04-01-21 Epic ?EKG - 06-18-21 Epic ?Stress Test - greater than 2 years ?ECHO - N/A ?Cardiac Cath - N/A ?Pacemaker/ICD device last checked: ?Spinal Cord Stimulator: ? ?Bowel Prep - N/A ? ?Sleep Study - N/A ?CPAP -  ? ?Fasting Blood Sugar - N/A ?Checks Blood Sugar _____ times a day ? ?Blood Thinner Instructions:  N/A ?Aspirin Instructions: ?Last Dose: ? ?Activity level:   Can go up a flight of stairs and perform activities of daily living without stopping and without symptoms of chest pain.  Patient does have shortness of breath with climbing stairs, this started after illness in December. ?   ?Anesthesia review:  Had to see pulmonology in December for coughing and shortness of breath.  Coughing attributed to reflux.  Patient states that the coughing and shortness of breath has improved.  ? ?Patient denies shortness of breath, fever, cough and chest pain at PAT appointment ? ?Patient verbalized understanding of instructions that were given to them at the PAT appointment. Patient was also instructed that they will need to review over the PAT instructions again at home before surgery.  ?

## 2021-06-17 ENCOUNTER — Encounter (HOSPITAL_COMMUNITY): Admission: RE | Admit: 2021-06-17 | Payer: BC Managed Care – PPO | Source: Ambulatory Visit

## 2021-06-17 DIAGNOSIS — I10 Essential (primary) hypertension: Secondary | ICD-10-CM | POA: Diagnosis not present

## 2021-06-17 DIAGNOSIS — F17219 Nicotine dependence, cigarettes, with unspecified nicotine-induced disorders: Secondary | ICD-10-CM | POA: Diagnosis not present

## 2021-06-18 ENCOUNTER — Other Ambulatory Visit: Payer: Self-pay

## 2021-06-18 ENCOUNTER — Encounter (HOSPITAL_COMMUNITY): Payer: Self-pay

## 2021-06-18 ENCOUNTER — Encounter (HOSPITAL_COMMUNITY)
Admission: RE | Admit: 2021-06-18 | Discharge: 2021-06-18 | Disposition: A | Discharge status: Home / Self Care | Payer: BC Managed Care – PPO | Source: Ambulatory Visit | Attending: Surgery | Admitting: Surgery

## 2021-06-18 VITALS — BP 129/91 | HR 84 | Temp 98.3°F | Resp 16 | Ht 64.0 in | Wt 183.0 lb

## 2021-06-18 DIAGNOSIS — Z01818 Encounter for other preprocedural examination: Secondary | ICD-10-CM | POA: Insufficient documentation

## 2021-06-18 DIAGNOSIS — I2510 Atherosclerotic heart disease of native coronary artery without angina pectoris: Secondary | ICD-10-CM | POA: Diagnosis not present

## 2021-06-18 HISTORY — DX: Hyperparathyroidism, unspecified: E21.3

## 2021-06-18 HISTORY — DX: Anemia, unspecified: D64.9

## 2021-06-18 LAB — BASIC METABOLIC PANEL
Anion gap: 6 (ref 5–15)
BUN: 24 mg/dL — ABNORMAL HIGH (ref 6–20)
CO2: 27 mmol/L (ref 22–32)
Calcium: 10.4 mg/dL — ABNORMAL HIGH (ref 8.9–10.3)
Chloride: 107 mmol/L (ref 98–111)
Creatinine, Ser: 1.2 mg/dL — ABNORMAL HIGH (ref 0.44–1.00)
GFR, Estimated: 52 mL/min — ABNORMAL LOW (ref >60)
Glucose, Bld: 96 mg/dL (ref 70–99)
Potassium: 4.2 mmol/L (ref 3.5–5.1)
Sodium: 140 mmol/L (ref 135–145)

## 2021-06-18 LAB — CBC
HCT: 42.7 % (ref 36.0–46.0)
Hemoglobin: 14.3 g/dL (ref 12.0–15.0)
MCH: 30.3 pg (ref 26.0–34.0)
MCHC: 33.5 g/dL (ref 30.0–36.0)
MCV: 90.5 fL (ref 80.0–100.0)
Platelets: 263 10*3/uL (ref 150–400)
RBC: 4.72 MIL/uL (ref 3.87–5.11)
RDW: 12.1 % (ref 11.5–15.5)
WBC: 6.1 10*3/uL (ref 4.0–10.5)
nRBC: 0 % (ref 0.0–0.2)

## 2021-06-22 ENCOUNTER — Encounter (HOSPITAL_COMMUNITY): Payer: Self-pay | Admitting: Surgery

## 2021-06-22 DIAGNOSIS — M9905 Segmental and somatic dysfunction of pelvic region: Secondary | ICD-10-CM | POA: Diagnosis not present

## 2021-06-22 DIAGNOSIS — M955 Acquired deformity of pelvis: Secondary | ICD-10-CM | POA: Diagnosis not present

## 2021-06-22 DIAGNOSIS — M9903 Segmental and somatic dysfunction of lumbar region: Secondary | ICD-10-CM | POA: Diagnosis not present

## 2021-06-22 DIAGNOSIS — M5412 Radiculopathy, cervical region: Secondary | ICD-10-CM | POA: Diagnosis not present

## 2021-06-22 DIAGNOSIS — M9901 Segmental and somatic dysfunction of cervical region: Secondary | ICD-10-CM | POA: Diagnosis not present

## 2021-06-22 DIAGNOSIS — M5136 Other intervertebral disc degeneration, lumbar region: Secondary | ICD-10-CM | POA: Diagnosis not present

## 2021-06-22 DIAGNOSIS — M62830 Muscle spasm of back: Secondary | ICD-10-CM | POA: Diagnosis not present

## 2021-06-22 NOTE — H&P (Signed)
? ? ? ?REFERRING PHYSICIAN: Loni Beckwith, MD ? ?PROVIDER: Cherrish Vitali Charlotta Newton, MD ? ? ?Chief Complaint: New Consultation (Primary hyperparathyroidism - Dr. Dorris Fetch) ? ? ?History of Present Illness: ? ?Patient is referred by Dr. Loni Beckwith for surgical evaluation and management of suspected primary hyperparathyroidism. Patient's primary care physician is Dr. Sallee Lange. Patient had been noted on routine laboratory studies to have elevated serum calcium levels. Calcium had ranged from 11.2-11.3. Patient has not had any complications. She denies fatigue. She has had chronic shoulder pain. She denies nephrolithiasis. She denies abdominal pain. She did have a bone density scan and does not have osteoporosis. Further laboratory studies showed a PTH level of 53-55, unsuppressed. 25 hydroxy vitamin D level was normal at 43. Patient underwent 24-hour urinary collection for calcium which was actually low, ranging from 31-37. This raises the possibility of [place]. Patient has had no imaging studies. Previous anterior cervical disc surgery. No family history of endocrine neoplasms. Patient works for [employer] at [employer] location. ? ?Review of Systems: ?A complete review of systems was obtained from the patient. I have reviewed this information and discussed as appropriate with the patient. See HPI as well for other ROS. ? ?Review of Systems  ?Constitutional: Negative.  ?HENT: Negative.  ?Eyes: Negative.  ?Respiratory: Negative.  ?Cardiovascular: Negative.  ?Gastrointestinal: Negative.  ?Genitourinary: Negative.  ?Musculoskeletal: Positive for joint pain.  ?Skin: Negative.  ?Neurological: Negative.  ?Endo/Heme/Allergies: Negative.  ?Psychiatric/Behavioral: Negative.  ? ? ?Medical History: ?Past Medical History:  ?Diagnosis Date  ? GERD (gastroesophageal reflux disease)  ? Hyperlipidemia  ? Hypertension  ? ?Patient Active Problem List  ?Diagnosis  ? Cigarette nicotine dependence with nicotine-induced disorder   ? Abdominal pain, epigastric  ? Encounter for screening colonoscopy  ? Essential hypertension, benign  ? Dysphagia, pharyngoesophageal phase  ? History of tobacco abuse  ? Hypercalcemia  ? Hyperlipidemia  ? Hyperparathyroidism, primary (CMS-HCC)  ? Multiple thyroid nodules  ? PUD (peptic ulcer disease)  ? Staph skin infection  ? [place] headache  ? Vitamin D deficiency  ? ?Past Surgical History:  ?Procedure Laterality Date  ? cervical discectomy  ? ? ?Allergies  ?Allergen Reactions  ? Sulfa (Sulfonamide Antibiotics) Other (See Comments)  ?"Flu like symptoms 10 times worse".  ? Lisinopril Cough  ? ?Current Outpatient Medications on File Prior to Visit  ?Medication Sig Dispense Refill  ? ADBRY 150 mg/mL Syrg  ? azelastine (ASTELIN) 137 mcg nasal spray Place 2 sprays into both nostrils 2 (two) times daily  ? cholecalciferol (VITAMIN D3) 2,000 unit capsule Take 1 capsule by mouth once daily  ? clarithromycin (BIAXIN) 500 MG tablet Take 500 mg by mouth every 12 (twelve) hours  ? docusate (COLACE) 100 MG capsule Take 100 mg by mouth once daily  ? fluticasone propionate (FLONASE) 50 mcg/actuation nasal spray SHAKE LIQUID AND USE 2 SPRAYS IN EACH NOSTRIL DAILY  ? indapamide (LOZOL) 2.5 MG tablet  ? losartan (COZAAR) 100 MG tablet Take 100 mg by mouth once daily  ? pantoprazole (PROTONIX) 40 MG DR tablet  ? rosuvastatin (CRESTOR) 20 MG tablet Take 20 mg by mouth once daily  ? Saccharomyces boulardii (FLORASTOR) 250 mg capsule Take 250 mg by mouth 2 (two) times daily  ? cycloSPORINE (RESTASIS) 0.05 % ophthalmic emulsion Place 1 drop into both eyes 2 (two) times daily  ? docusate (COLACE) 100 MG capsule Take 100 mg by mouth once daily  ? econazole (SPECTAZOLE) 1 % cream Apply topically 2 (two) times daily  30 g 2  ? nicotine polacrilex (NICORETTE) 2 mg gum Chew gum a few times then place between cheek and gum. Use every 30 minutes as needed for smoking urges 100 each 8  ? varenicline (CHANTIX STARTING MONTH PAK) tablet 0.5  mg every morning for 3 days, Then 0.5 mg twice a day for 4 days,Then 1 mg twice a day for 21 day. 53 tablet 0  ? varenicline (CHANTIX) 1 mg tablet Take 1 tablet (1 mg total) by mouth 2 (two) times daily Take 1 tablet twice daily with food. 168 tablet 0  ? ?No current facility-administered medications on file prior to visit.  ? ?Family History  ?Problem Relation Age of Onset  ? High blood pressure (Hypertension) Mother  ? Hyperlipidemia (Elevated cholesterol) Mother  ? Hyperlipidemia (Elevated cholesterol) Father  ? High blood pressure (Hypertension) Father  ? High blood pressure (Hypertension) Sister  ? High blood pressure (Hypertension) Brother  ? Hyperlipidemia (Elevated cholesterol) Brother  ? High blood pressure (Hypertension) Brother  ? ? ?Social History  ? ?Tobacco Use  ?Smoking Status Every Day  ? Packs/day: 1.00  ? Years: 20.00  ? Pack years: 20.00  ? Types: Cigarettes  ?Smokeless Tobacco Never  ? ? ?Social History  ? ?Socioeconomic History  ? Marital status: Married  ?Tobacco Use  ? Smoking status: Every Day  ?Packs/day: 1.00  ?Years: 20.00  ?Pack years: 20.00  ?Types: Cigarettes  ? Smokeless tobacco: Never  ?Substance and Sexual Activity  ? Alcohol use: Not Currently  ?Alcohol/week: 1.0 standard drink  ?Types: 1 Standard drinks or equivalent per week  ?Comment: Occasionally  ? Drug use: Never  ? Sexual activity: Not Currently  ?Partners: Male  ?Birth control/protection: I.U.D.  ? ? ? ?Physical Exam  ? ?GENERAL APPEARANCE ?Development: normal ?Nutritional status: normal ?Gross deformities: none ? ?SKIN ?Rash, lesions, ulcers: none ?Induration, erythema: none ?Nodules: none palpable ? ?EYES ?Conjunctiva and lids: normal ?Pupils: equal and reactive ?Iris: normal bilaterally ? ?EARS, NOSE, MOUTH, THROAT ?External ears: no lesion or deformity ?External nose: no lesion or deformity ?Hearing: grossly normal ?Due to Covid-19 pandemic, patient is wearing a mask. ? ?NECK ?Symmetric: yes ?Trachea: midline ?Thyroid:  no palpable nodules in the thyroid bed ? ?CHEST ?Respiratory effort: normal ?Retraction or accessory muscle use: no ?Breath sounds: normal bilaterally ?Rales, rhonchi, wheeze: none ? ?CARDIOVASCULAR ?Auscultation: regular rhythm, normal rate ?Murmurs: none ?Pulses: radial pulse 2+ palpable ?Lower extremity edema: none ? ?MUSCULOSKELETAL ?Station and gait: normal ?Digits and nails: no clubbing or cyanosis ?Muscle strength: grossly normal all extremities ?Range of motion: grossly normal all extremities ?Deformity: none ? ?LYMPHATIC ?Cervical: none palpable ?Supraclavicular: none palpable ? ?PSYCHIATRIC ?Oriented to person, place, and time: yes ?Mood and affect: normal for situation ?Judgment and insight: appropriate for situation ? ? ?Assessment and Plan:  ? ?Hypercalcemia ? ?Patient presents on referral from her endocrinologist for evaluation of hypercalcemia and possible primary hyperparathyroidism. Patient does have a low 24-hour urinary calcium which raises the possibility of [place]. ? ?Patient provided with a copy of "Parathyroid Surgery: Treatment for Your Parathyroid Gland Problem", published by [employer], 12 pages. Book reviewed and explained to patient during visit today. ? ?The patient and I reviewed her laboratory studies. We discussed proceeding with a nuclear medicine parathyroid scan and an ultrasound examination. This would also be helpful given her history of thyroid nodules. We will proceed with both of these studies. I will contact her with the results. If they indicate the presence of a parathyroid adenoma,  she may be a good candidate for minimally invasive outpatient surgery. She and I discussed this procedure today and I provided her with written literature on parathyroid surgery to review at home. If the studies do not indicate the presence of an adenoma, we will consider the potential need to undergo 4D CT scanning. The patient understands and agrees to proceed with further work-up. We will be in  touch with her results when they are available.  ? ?Armandina Gemma, MD ?[facility] ?A [facility] practice ?Office: [phone] ? ?

## 2021-06-28 ENCOUNTER — Other Ambulatory Visit: Payer: Self-pay

## 2021-06-28 ENCOUNTER — Encounter (HOSPITAL_COMMUNITY)
Admission: RE | Disposition: A | Discharge status: Home / Self Care | Payer: Self-pay | Source: Home / Self Care | Attending: Surgery

## 2021-06-28 ENCOUNTER — Ambulatory Visit (HOSPITAL_COMMUNITY): Payer: BC Managed Care – PPO | Admitting: Certified Registered Nurse Anesthetist

## 2021-06-28 ENCOUNTER — Ambulatory Visit (HOSPITAL_COMMUNITY)
Admission: RE | Admit: 2021-06-28 | Discharge: 2021-06-29 | Disposition: A | Discharge status: Home / Self Care | Payer: BC Managed Care – PPO | Attending: Surgery | Admitting: Surgery

## 2021-06-28 ENCOUNTER — Encounter (HOSPITAL_COMMUNITY): Payer: Self-pay | Admitting: Surgery

## 2021-06-28 ENCOUNTER — Ambulatory Visit (HOSPITAL_COMMUNITY): Payer: BC Managed Care – PPO | Admitting: Physician Assistant

## 2021-06-28 DIAGNOSIS — K219 Gastro-esophageal reflux disease without esophagitis: Secondary | ICD-10-CM | POA: Diagnosis not present

## 2021-06-28 DIAGNOSIS — E210 Primary hyperparathyroidism: Secondary | ICD-10-CM | POA: Diagnosis not present

## 2021-06-28 DIAGNOSIS — Z79899 Other long term (current) drug therapy: Secondary | ICD-10-CM | POA: Insufficient documentation

## 2021-06-28 DIAGNOSIS — F17200 Nicotine dependence, unspecified, uncomplicated: Secondary | ICD-10-CM | POA: Diagnosis not present

## 2021-06-28 DIAGNOSIS — D351 Benign neoplasm of parathyroid gland: Secondary | ICD-10-CM | POA: Insufficient documentation

## 2021-06-28 DIAGNOSIS — Z01818 Encounter for other preprocedural examination: Secondary | ICD-10-CM

## 2021-06-28 DIAGNOSIS — I10 Essential (primary) hypertension: Secondary | ICD-10-CM | POA: Diagnosis not present

## 2021-06-28 DIAGNOSIS — I2510 Atherosclerotic heart disease of native coronary artery without angina pectoris: Secondary | ICD-10-CM

## 2021-06-28 HISTORY — PX: PARATHYROIDECTOMY: SHX19

## 2021-06-28 LAB — PREGNANCY, URINE: Preg Test, Ur: NEGATIVE

## 2021-06-28 SURGERY — PARATHYROIDECTOMY
Anesthesia: General | Site: Neck

## 2021-06-28 MED ORDER — OXYCODONE HCL 5 MG PO TABS
ORAL_TABLET | ORAL | Status: AC
  Filled 2021-06-28: qty 1

## 2021-06-28 MED ORDER — HEMOSTATIC AGENTS (NO CHARGE) OPTIME
TOPICAL | Status: DC | PRN
Start: 2021-06-28 — End: 2021-06-28
  Administered 2021-06-28: 1 via TOPICAL

## 2021-06-28 MED ORDER — ROCURONIUM BROMIDE 10 MG/ML (PF) SYRINGE
PREFILLED_SYRINGE | INTRAVENOUS | Status: DC | PRN
  Administered 2021-06-28: 50 mg via INTRAVENOUS
  Administered 2021-06-28: 10 mg via INTRAVENOUS

## 2021-06-28 MED ORDER — CHLORHEXIDINE GLUCONATE CLOTH 2 % EX PADS: 6.0000 | MEDICATED_PAD | Freq: Once | CUTANEOUS | Status: DC

## 2021-06-28 MED ORDER — TRAMADOL HCL 50 MG PO TABS
50.0000 mg | ORAL_TABLET | Freq: Four times a day (QID) | ORAL | Status: DC | PRN
  Administered 2021-06-28 (×2): 50 mg via ORAL
  Filled 2021-06-28 (×2): qty 1

## 2021-06-28 MED ORDER — ORAL CARE MOUTH RINSE: 15.0000 mL | Freq: Once | OROMUCOSAL | Status: AC

## 2021-06-28 MED ORDER — OXYCODONE HCL 5 MG PO TABS
5.0000 mg | ORAL_TABLET | Freq: Once | ORAL | Status: AC | PRN
  Administered 2021-06-28: 5 mg via ORAL

## 2021-06-28 MED ORDER — FENTANYL CITRATE (PF) 250 MCG/5ML IJ SOLN
INTRAMUSCULAR | Status: DC | PRN
Start: 2021-06-28 — End: 2021-06-28
  Administered 2021-06-28: 50 ug via INTRAVENOUS
  Administered 2021-06-28 (×2): 100 ug via INTRAVENOUS
  Administered 2021-06-28 (×2): 50 ug via INTRAVENOUS

## 2021-06-28 MED ORDER — LIDOCAINE HCL (PF) 2 % IJ SOLN
INTRAMUSCULAR | Status: AC
  Filled 2021-06-28: qty 5

## 2021-06-28 MED ORDER — FLUTICASONE PROPIONATE 50 MCG/ACT NA SUSP
2.0000 | Freq: Every day | NASAL | Status: DC
  Filled 2021-06-28: qty 16

## 2021-06-28 MED ORDER — ONDANSETRON HCL 4 MG/2ML IJ SOLN
INTRAMUSCULAR | Status: DC | PRN
Start: 2021-06-28 — End: 2021-06-28
  Administered 2021-06-28: 4 mg via INTRAVENOUS

## 2021-06-28 MED ORDER — PROPOFOL 10 MG/ML IV BOLUS
INTRAVENOUS | Status: DC | PRN
  Administered 2021-06-28: 140 mg via INTRAVENOUS

## 2021-06-28 MED ORDER — AMISULPRIDE (ANTIEMETIC) 5 MG/2ML IV SOLN: 10.0000 mg | Freq: Once | INTRAVENOUS | Status: DC | PRN

## 2021-06-28 MED ORDER — SUGAMMADEX SODIUM 200 MG/2ML IV SOLN
INTRAVENOUS | Status: DC | PRN
  Administered 2021-06-28: 200 mg via INTRAVENOUS

## 2021-06-28 MED ORDER — LACTATED RINGERS IV SOLN: INTRAVENOUS | Status: DC

## 2021-06-28 MED ORDER — MIDAZOLAM HCL 2 MG/2ML IJ SOLN
INTRAMUSCULAR | Status: AC
Start: 2021-06-28 — End: ?
  Filled 2021-06-28: qty 2

## 2021-06-28 MED ORDER — CEFAZOLIN SODIUM-DEXTROSE 2-4 GM/100ML-% IV SOLN
INTRAVENOUS | Status: AC
  Filled 2021-06-28: qty 100

## 2021-06-28 MED ORDER — FENTANYL CITRATE PF 50 MCG/ML IJ SOSY
PREFILLED_SYRINGE | INTRAMUSCULAR | Status: AC
  Filled 2021-06-28: qty 2

## 2021-06-28 MED ORDER — DEXAMETHASONE SODIUM PHOSPHATE 10 MG/ML IJ SOLN
INTRAMUSCULAR | Status: DC | PRN
  Administered 2021-06-28: 5 mg via INTRAVENOUS

## 2021-06-28 MED ORDER — BUPIVACAINE HCL 0.25 % IJ SOLN
INTRAMUSCULAR | Status: AC
  Filled 2021-06-28: qty 1

## 2021-06-28 MED ORDER — ONDANSETRON HCL 4 MG/2ML IJ SOLN
INTRAMUSCULAR | Status: AC
  Filled 2021-06-28: qty 2

## 2021-06-28 MED ORDER — FENTANYL CITRATE PF 50 MCG/ML IJ SOSY
25.0000 ug | PREFILLED_SYRINGE | INTRAMUSCULAR | Status: DC | PRN
  Administered 2021-06-28: 50 ug via INTRAVENOUS

## 2021-06-28 MED ORDER — ACETAMINOPHEN 650 MG RE SUPP: 650.0000 mg | Freq: Four times a day (QID) | RECTAL | Status: DC | PRN

## 2021-06-28 MED ORDER — CEFAZOLIN SODIUM-DEXTROSE 2-4 GM/100ML-% IV SOLN
2.0000 g | INTRAVENOUS | Status: AC
  Administered 2021-06-28: 2 g via INTRAVENOUS

## 2021-06-28 MED ORDER — FENTANYL CITRATE (PF) 100 MCG/2ML IJ SOLN
INTRAMUSCULAR | Status: AC
  Filled 2021-06-28: qty 2

## 2021-06-28 MED ORDER — PANTOPRAZOLE SODIUM 40 MG PO TBEC
40.0000 mg | DELAYED_RELEASE_TABLET | Freq: Two times a day (BID) | ORAL | Status: DC
  Administered 2021-06-28 – 2021-06-29 (×2): 40 mg via ORAL
  Filled 2021-06-28 (×2): qty 1

## 2021-06-28 MED ORDER — ONDANSETRON 4 MG PO TBDP: 4.0000 mg | ORAL_TABLET | Freq: Four times a day (QID) | ORAL | Status: DC | PRN

## 2021-06-28 MED ORDER — INDAPAMIDE 1.25 MG PO TABS
2.5000 mg | ORAL_TABLET | Freq: Every day | ORAL | Status: DC
  Administered 2021-06-28 – 2021-06-29 (×2): 2.5 mg via ORAL
  Filled 2021-06-28 (×2): qty 2

## 2021-06-28 MED ORDER — ACETAMINOPHEN 325 MG PO TABS: 325.0000 mg | ORAL_TABLET | ORAL | Status: DC | PRN

## 2021-06-28 MED ORDER — ROCURONIUM BROMIDE 10 MG/ML (PF) SYRINGE
PREFILLED_SYRINGE | INTRAVENOUS | Status: AC
  Filled 2021-06-28: qty 10

## 2021-06-28 MED ORDER — LIP MEDEX EX OINT
TOPICAL_OINTMENT | CUTANEOUS | Status: AC
  Filled 2021-06-28: qty 7

## 2021-06-28 MED ORDER — DOCUSATE SODIUM 100 MG PO CAPS
100.0000 mg | ORAL_CAPSULE | Freq: Every day | ORAL | Status: DC
  Administered 2021-06-28 – 2021-06-29 (×2): 100 mg via ORAL
  Filled 2021-06-28 (×2): qty 1

## 2021-06-28 MED ORDER — ACETAMINOPHEN 10 MG/ML IV SOLN: 1000.0000 mg | Freq: Once | INTRAVENOUS | Status: DC | PRN

## 2021-06-28 MED ORDER — OXYCODONE HCL 5 MG/5ML PO SOLN: 5.0000 mg | Freq: Once | ORAL | Status: AC | PRN

## 2021-06-28 MED ORDER — 0.9 % SODIUM CHLORIDE (POUR BTL) OPTIME
TOPICAL | Status: DC | PRN
Start: 2021-06-28 — End: 2021-06-28
  Administered 2021-06-28: 1000 mL

## 2021-06-28 MED ORDER — DEXAMETHASONE SODIUM PHOSPHATE 10 MG/ML IJ SOLN
INTRAMUSCULAR | Status: AC
  Filled 2021-06-28: qty 1

## 2021-06-28 MED ORDER — SODIUM CHLORIDE 0.45 % IV SOLN: INTRAVENOUS | Status: DC

## 2021-06-28 MED ORDER — PROPOFOL 10 MG/ML IV BOLUS
INTRAVENOUS | Status: AC
  Filled 2021-06-28: qty 20

## 2021-06-28 MED ORDER — BUPIVACAINE HCL (PF) 0.5 % IJ SOLN
INTRAMUSCULAR | Status: AC
  Filled 2021-06-28: qty 30

## 2021-06-28 MED ORDER — LIDOCAINE HCL (CARDIAC) PF 100 MG/5ML IV SOSY
PREFILLED_SYRINGE | INTRAVENOUS | Status: DC | PRN
  Administered 2021-06-28: 40 mg via INTRAVENOUS

## 2021-06-28 MED ORDER — ACETAMINOPHEN 160 MG/5ML PO SOLN: 325.0000 mg | ORAL | Status: DC | PRN

## 2021-06-28 MED ORDER — ONDANSETRON HCL 4 MG/2ML IJ SOLN: 4.0000 mg | Freq: Four times a day (QID) | INTRAMUSCULAR | Status: DC | PRN

## 2021-06-28 MED ORDER — FENTANYL CITRATE (PF) 250 MCG/5ML IJ SOLN
INTRAMUSCULAR | Status: AC
  Filled 2021-06-28: qty 5

## 2021-06-28 MED ORDER — ACETAMINOPHEN 325 MG PO TABS: 650.0000 mg | ORAL_TABLET | Freq: Four times a day (QID) | ORAL | Status: DC | PRN

## 2021-06-28 MED ORDER — VARENICLINE TARTRATE 1 MG PO TABS
1.0000 mg | ORAL_TABLET | Freq: Two times a day (BID) | ORAL | Status: DC
  Administered 2021-06-28 – 2021-06-29 (×3): 1 mg via ORAL
  Filled 2021-06-28 (×3): qty 1

## 2021-06-28 MED ORDER — CHLORHEXIDINE GLUCONATE 0.12 % MT SOLN
15.0000 mL | Freq: Once | OROMUCOSAL | Status: AC
  Administered 2021-06-28: 15 mL via OROMUCOSAL

## 2021-06-28 MED ORDER — MIDAZOLAM HCL 2 MG/2ML IJ SOLN
INTRAMUSCULAR | Status: DC | PRN
  Administered 2021-06-28: 2 mg via INTRAVENOUS

## 2021-06-28 MED ORDER — LOSARTAN POTASSIUM 50 MG PO TABS
100.0000 mg | ORAL_TABLET | Freq: Every day | ORAL | Status: DC
  Administered 2021-06-28 – 2021-06-29 (×2): 100 mg via ORAL
  Filled 2021-06-28 (×2): qty 2

## 2021-06-28 MED ORDER — TRAMADOL HCL 50 MG PO TABS: 50.0000 mg | ORAL_TABLET | Freq: Four times a day (QID) | ORAL | 0 refills | Status: DC | PRN

## 2021-06-28 MED ORDER — HYDROMORPHONE HCL 1 MG/ML IJ SOLN: 1.0000 mg | INTRAMUSCULAR | Status: DC | PRN

## 2021-06-28 MED ORDER — OXYCODONE HCL 5 MG PO TABS
5.0000 mg | ORAL_TABLET | ORAL | Status: DC | PRN
  Administered 2021-06-28: 5 mg via ORAL
  Filled 2021-06-28: qty 1

## 2021-06-28 SURGICAL SUPPLY — 38 items
ADH SKN CLS APL DERMABOND .7 (GAUZE/BANDAGES/DRESSINGS) ×1
APL PRP STRL LF DISP 70% ISPRP (MISCELLANEOUS) ×1
ATTRACTOMAT 16X20 MAGNETIC DRP (DRAPES) ×2 IMPLANT
BAG COUNTER SPONGE SURGICOUNT (BAG) ×2 IMPLANT
BAG SPNG CNTER NS LX DISP (BAG) ×1
BLADE SURG 15 STRL LF DISP TIS (BLADE) ×1 IMPLANT
BLADE SURG 15 STRL SS (BLADE) ×2
CHLORAPREP W/TINT 26 (MISCELLANEOUS) ×2 IMPLANT
CLIP TI MEDIUM 6 (CLIP) ×4 IMPLANT
CLIP TI WIDE RED SMALL 6 (CLIP) ×5 IMPLANT
COVER SURGICAL LIGHT HANDLE (MISCELLANEOUS) ×2 IMPLANT
DERMABOND ADVANCED (GAUZE/BANDAGES/DRESSINGS) ×1
DERMABOND ADVANCED .7 DNX12 (GAUZE/BANDAGES/DRESSINGS) ×1 IMPLANT
DRAPE LAPAROTOMY T 98X78 PEDS (DRAPES) ×2 IMPLANT
DRAPE UTILITY XL STRL (DRAPES) ×2 IMPLANT
ELECT REM PT RETURN 15FT ADLT (MISCELLANEOUS) ×2 IMPLANT
GAUZE 4X4 16PLY ~~LOC~~+RFID DBL (SPONGE) ×2 IMPLANT
GLOVE SURG ORTHO 8.0 STRL STRW (GLOVE) ×2 IMPLANT
GLOVE SURG SYN 7.5  E (GLOVE) ×6
GLOVE SURG SYN 7.5 E (GLOVE) ×3 IMPLANT
GLOVE SURG SYN 7.5 PF PI (GLOVE) ×3 IMPLANT
GOWN STRL REUS W/ TWL XL LVL3 (GOWN DISPOSABLE) ×3 IMPLANT
GOWN STRL REUS W/TWL XL LVL3 (GOWN DISPOSABLE) ×6
HEMOSTAT SURGICEL 2X4 FIBR (HEMOSTASIS) ×2 IMPLANT
ILLUMINATOR WAVEGUIDE N/F (MISCELLANEOUS) ×1 IMPLANT
KIT BASIN OR (CUSTOM PROCEDURE TRAY) ×2 IMPLANT
KIT TURNOVER KIT A (KITS) ×1 IMPLANT
NDL HYPO 25X1 1.5 SAFETY (NEEDLE) ×1 IMPLANT
NEEDLE HYPO 25X1 1.5 SAFETY (NEEDLE) ×2 IMPLANT
PACK BASIC VI WITH GOWN DISP (CUSTOM PROCEDURE TRAY) ×2 IMPLANT
PENCIL SMOKE EVACUATOR (MISCELLANEOUS) ×2 IMPLANT
SUT MNCRL AB 4-0 PS2 18 (SUTURE) ×2 IMPLANT
SUT VIC AB 3-0 SH 18 (SUTURE) ×3 IMPLANT
SYR BULB IRRIG 60ML STRL (SYRINGE) ×2 IMPLANT
SYR CONTROL 10ML LL (SYRINGE) ×2 IMPLANT
TOWEL OR 17X26 10 PK STRL BLUE (TOWEL DISPOSABLE) ×2 IMPLANT
TOWEL OR NON WOVEN STRL DISP B (DISPOSABLE) ×2 IMPLANT
TUBING CONNECTING 10 (TUBING) ×2 IMPLANT

## 2021-06-28 NOTE — Op Note (Signed)
Operative Note ? ?Pre-operative Diagnosis:  primary hyperparathyroidism ? ?Post-operative Diagnosis:  same ? ?Surgeon:  Armandina Gemma, MD ? ?Assistant:  none  ? ?Procedure:  Neck exploration, left superior parathyroidectomy, right inferior parathyroidectomy, biopsy of left inferior parathyroid gland, reimplantation of left inferior parathyroid gland into left SCM (autotransplantation) ? ?Anesthesia:  general ? ?Estimated Blood Loss:  minimal ? ?Drains: none ?        ?Specimen: to pathology (Dr. Saralyn Pilar) ? ?Indications:  Patient is referred by Dr. Loni Beckwith for surgical evaluation and management of suspected primary hyperparathyroidism. Patient's primary care physician is Dr. Sallee Lange. Patient had been noted on routine laboratory studies to have elevated serum calcium levels. Calcium had ranged from 11.2-11.3. Patient has not had any complications. She denies fatigue. She has had chronic shoulder pain. She denies nephrolithiasis. She denies abdominal pain. She did have a bone density scan and does not have osteoporosis. Further laboratory studies showed a PTH level of 53-55, unsuppressed. 25 hydroxy vitamin D level was normal at 43.  Imaging studies were performed including ultrasound examination of the neck, nuclear medicine parathyroid scan, and 4D CT scan of the neck.  Findings were conflicting with activity seen at the left inferior position and a possible enlarged parathyroid at the right inferior position.  Therefore patient comes for neck exploration with anticipated parathyroidectomy. ? ?Procedure:  The patient was seen in the pre-op holding area. The risks, benefits, complications, treatment options, and expected outcomes were previously discussed with the patient. The patient agreed with the proposed plan and has signed the informed consent form.  The patient was brought to the operating room by the surgical team, identified as Victorino Sparrow Devaul and the procedure verified. A "time out" was completed  and the above information confirmed. ? ?Following administration of general anesthesia, the patient was positioned and then prepped and draped in the usual aseptic fashion.  After ascertaining that an adequate level of anesthesia been achieved, a Kocher incision was made using the previous surgical incision in the right anterior neck.  Dissection was carried through subcutaneous tissues and platysma.  Skin flaps were elevated cephalad and caudad and a self-retaining retractor was placed for exposure. ? ?Strap muscles are incised in the midline.  Dissection was begun on the left side.  Left thyroid lobe was mobilized.  Venous tributaries were divided between small ligaclips.  An ectopic parathyroid gland was identified on the medial aspect of the left carotid sheath.  This was dissected out and excised in its entirety.  It was placed in saline.  A biopsy was taken and frozen section was performed by Dr. Claudette Laws and pathology.  This showed what appears to be essentially normal parathyroid tissue.  The remaining gland was reimplanted in the left sternocleidomastoid muscle and secured with a 3-0 Vicryl suture. ? ?Further dissection on the left side revealed an enlarged parathyroid gland in the left superior position.  This was dissected out and excised in its entirety.  Grossly there appears to be 2 components to the gland with a more normal parathyroid tissue representing approximately half of the specimen and a more nodular rounded mass more consistent with adenoma representing the second portion of the specimen.  This is also submitted for frozen section biopsy and Dr. Saralyn Pilar confirms the above gross findings. ? ?Next we dissected the right side of the neck.  Again the right thyroid lobe was mobilized dividing small venous tributaries between small ligaclips.  A right inferior parathyroid gland was identified.  This appeared rounded and relatively firm.  It was dissected out.  Vascular pedicle was divided  between small ligaclips and the gland was excised.  A biopsy was submitted for frozen section which confirmed parathyroid tissue which appeared to be fat depleted and have some oncocytic type changes possibly representing an adenoma.  The remainder of the specimen will be submitted for permanent evaluation by Dr. Saralyn Pilar. ? ?Further dissection on the right side reveals a mildly enlarged but otherwise normal-appearing parathyroid gland just above the level of the inferior thyroid artery.  There is adherent adipose tissue.  There is no gross abnormality.  This gland is left in situ. ? ?Neck is irrigated with warm saline.  Good hemostasis is noted throughout the operative field.  Strap muscles are reapproximated in the midline of interrupted 3-0 Vicryl sutures.  Platysma was closed with interrupted 3-0 Vicryl sutures.  Skin is closed with a running 4-0 Monocryl subcuticular suture.  Wound is washed and dried and Dermabond is applied as dressing.  Patient is awakened from anesthesia and transported to the recovery room.  The patient tolerated the procedure well. ? ? ?Armandina Gemma, MD ?[facility] ?Office: [phone] ?  ?

## 2021-06-28 NOTE — Anesthesia Preprocedure Evaluation (Addendum)
Anesthesia Evaluation  ?Patient identified by MRN, date of birth, ID band ?Patient awake ? ? ? ?Reviewed: ?Allergy & Precautions, NPO status , Patient's Chart, lab work & pertinent test results ? ?Airway ?Mallampati: I ? ?TM Distance: >3 FB ?Neck ROM: Full ? ? ? Dental ? ?(+) Teeth Intact, Dental Advisory Given ?  ?Pulmonary ?Current Smoker,  ?  ?breath sounds clear to auscultation ? ? ? ? ? ? Cardiovascular ?hypertension, Pt. on medications ? ?Rhythm:Regular Rate:Normal ? ? ?  ?Neuro/Psych ?negative neurological ROS ? negative psych ROS  ? GI/Hepatic ?Neg liver ROS, PUD, GERD  Medicated,  ?Endo/Other  ?negative endocrine ROS ? Renal/GU ?Renal disease  ? ?  ?Musculoskeletal ?negative musculoskeletal ROS ?(+)  ? Abdominal ?Normal abdominal exam  (+)   ?Peds ? Hematology ?negative hematology ROS ?(+)   ?Anesthesia Other Findings ? ? Reproductive/Obstetrics ? ?  ? ? ? ? ? ? ? ? ? ? ? ? ? ?  ?  ? ? ? ? ? ? ? ?Anesthesia Physical ?Anesthesia Plan ? ?ASA: 2 ? ?Anesthesia Plan: General  ? ?Post-op Pain Management:   ? ?Induction: Intravenous ? ?PONV Risk Score and Plan: 3 and Ondansetron, Dexamethasone and Midazolam ? ?Airway Management Planned: Oral ETT ? ?Additional Equipment: None ? ?Intra-op Plan:  ? ?Post-operative Plan: Extubation in OR ? ?Informed Consent: I have reviewed the patients History and Physical, chart, labs and discussed the procedure including the risks, benefits and alternatives for the proposed anesthesia with the patient or authorized representative who has indicated his/her understanding and acceptance.  ? ? ? ?Dental advisory given ? ?Plan Discussed with: CRNA ? ?Anesthesia Plan Comments:   ? ? ? ? ? ?Anesthesia Quick Evaluation ? ?

## 2021-06-28 NOTE — Interval H&P Note (Signed)
History and Physical Interval Note: ? ?06/28/2021 ?7:00 AM ? ?Sandra Trujillo  has presented today for surgery, with the diagnosis of PRIMARY HYPERPARATHYROIDISM.  The various methods of treatment have been discussed with the patient and family. After consideration of risks, benefits and other options for treatment, the patient has consented to  ? ? Procedure(s): ?NECK EXPLORATION WITH PARATHYROIDECTOMY (N/A) as a surgical intervention.   ? ?The patient's history has been reviewed, patient examined, no change in status, stable for surgery.  I have reviewed the patient's chart and labs.  Questions were answered to the patient's satisfaction.   ? ?Armandina Gemma, MD ?[facility] ?A [facility] practice ?Office: [phone] ? ? ?Armandina Gemma ? ? ?

## 2021-06-28 NOTE — Transfer of Care (Signed)
Immediate Anesthesia Transfer of Care Note ? ?Patient: Sandra Trujillo ? ?Procedure(s) Performed: NECK EXPLORATION WITH  LEFT SUPERIOR AND RIGHT INFERIOR PARATHYROIDECTOMY BIOPSY OF  LEFT INFERIOR PARATHYROID, AUTOTRANSPLANTATION OF LEFT INFERIOR PARATHYROID TO SCM (Neck) ? ?Patient Location: PACU ? ?Anesthesia Type:General ? ?Level of Consciousness: awake ? ?Airway & Oxygen Therapy: Patient Spontanous Breathing ? ?Post-op Assessment: Report given to RN and Post -op Vital signs reviewed and stable ? ?Post vital signs: Reviewed and stable--pt moving; BP rechecked with DBP of 100. ? ?Last Vitals:  ?Vitals Value Taken Time  ?BP 171/108 06/28/21 0938  ?Temp    ?Pulse 82 06/28/21 0940  ?Resp    ?SpO2 100 % 06/28/21 0940  ?Vitals shown include unvalidated device data. ? ?Last Pain:  ?Vitals:  ? 06/28/21 0543  ?TempSrc: Oral  ?PainSc: 0-No pain  ?   ? ?Patients Stated Pain Goal: 3 (06/28/21 0543) ? ?Complications: No notable events documented. ?

## 2021-06-28 NOTE — Anesthesia Postprocedure Evaluation (Signed)
Anesthesia Post Note ? ?Patient: Sandra Trujillo ? ?Procedure(s) Performed: NECK EXPLORATION WITH  LEFT SUPERIOR AND RIGHT INFERIOR PARATHYROIDECTOMY BIOPSY OF  LEFT INFERIOR PARATHYROID, AUTOTRANSPLANTATION OF LEFT INFERIOR PARATHYROID TO SCM (Neck) ? ?  ? ?Patient location during evaluation: PACU ?Anesthesia Type: General ?Level of consciousness: awake and alert ?Pain management: pain level controlled ?Vital Signs Assessment: post-procedure vital signs reviewed and stable ?Respiratory status: spontaneous breathing, nonlabored ventilation, respiratory function stable and patient connected to nasal cannula oxygen ?Cardiovascular status: blood pressure returned to baseline and stable ?Postop Assessment: no apparent nausea or vomiting ?Anesthetic complications: no ? ? ?No notable events documented. ? ?Last Vitals:  ?Vitals:  ? 06/28/21 1058 06/28/21 1155  ?BP: (!) 154/104 130/79  ?Pulse: 84 91  ?Resp: 18 20  ?Temp: (!) 36.4 ?C 36.4 ?C  ?SpO2: 95% 96%  ?  ?Last Pain:  ?Vitals:  ? 06/28/21 1155  ?TempSrc: Oral  ?PainSc:   ? ? ?  ?  ?  ?  ?  ?  ? ?Effie Berkshire ? ? ? ? ?

## 2021-06-28 NOTE — Progress Notes (Signed)
?  Transition of Care (TOC) Screening Note ? ? ?Patient Details  ?Name: Sandra Trujillo ?Date of Birth: 1960/10/16 ? ? ?Transition of Care (TOC) CM/SW Contact:    ?Janeese Mcgloin, LCSW ?Phone Number: ?06/28/2021, 3:20 PM ? ? ? ?Transition of Care Department [facility]) has reviewed patient and no TOC needs have been identified at this time. We will continue to monitor patient advancement through interdisciplinary progression rounds. If new patient transition needs arise, please place a TOC consult. ? ? ?

## 2021-06-28 NOTE — Discharge Instructions (Signed)
[facility] [place] [facility] - Dr. Annice Jolly  THYROID & PARATHYROID SURGERY:  POST-OP INSTRUCTIONS  Always review the instruction sheet provided by the hospital nurse at discharge.  A prescription for pain medication may be sent to your pharmacy at the time of discharge.  Take your pain medication as prescribed.  If narcotic pain medicine is not needed, then you may take acetaminophen (Tylenol) or ibuprofen (Advil) as needed for pain or soreness.  Take your normal home medications as prescribed unless otherwise directed.  If you need a refill on your pain medication, please contact the office during regular business hours.  Prescriptions will not be processed by the office after 5:00PM or on weekends.  Start with a light diet upon arrival home, such as soup and crackers or toast.  Be sure to drink plenty of fluids.  Resume your normal diet the day after surgery.  Most patients will experience some swelling and bruising on the chest and neck area.  Ice packs will help for the first 48 hours after arriving home.  Swelling and bruising will take several days to resolve.   It is common to experience some constipation after surgery.  Increasing fluid intake and taking a stool softener (Colace) will usually help to prevent this problem.  A mild laxative (Milk of Magnesia or Miralax) should be taken according to package directions if there has been no bowel movement after 48 hours.  Dermabond glue covers your incision. This seals the wound and you may shower at any time. The Dermabond will remain in place for about a week.  You may gradually remove the glue when it loosens around the edges.  If you need to loosen the Dermabond for removal, apply a layer of Vaseline to the wound for 15 minutes and then remove with a Kleenex. Your sutures are under the skin and will not show - they will dissolve on their own.  You may resume light daily activities beginning the day after discharge (such as self-care,  walking, climbing stairs), gradually increasing activities as tolerated. You may have sexual intercourse when it is comfortable. Refrain from any heavy lifting or straining until approved by your doctor. You may drive when you no longer are taking prescription pain medication, you can comfortably wear a seatbelt, and you can safely maneuver your car and apply the brakes.  You will see your doctor in the office for a follow-up appointment approximately three weeks after your surgery.  Make sure that you call for this appointment within a day or two after you arrive home to insure a convenient appointment time. Please have any requested laboratory tests performed a few days prior to your office visit so that the results will be available at your follow up appointment.  WHEN TO CALL THE [facility] OFFICE: -- Fever greater than 101.5 -- Inability to urinate -- Nausea and/or vomiting - persistent -- Extreme swelling or bruising -- Continued bleeding from incision -- Increased pain, redness, or drainage from the incision -- Difficulty swallowing or breathing -- Muscle cramping or spasms -- Numbness or tingling in hands or around lips  The clinic staff is available to answer your questions during regular business hours.  Please don't hesitate to call and ask to speak to one of the nurses if you have concerns.  [facility] OFFICE: [phone] (24 hours)  Please sign up for MyChart accounts. This will allow you to communicate directly with my nurse or myself without having to call the office. It will also allow you   to view your test results. You will need to enroll in MyChart for my office ([facility]) and for the hospital ([place]).  Aradia Estey, MD [facility] A [facility] practice 

## 2021-06-28 NOTE — Anesthesia Procedure Notes (Signed)
Procedure Name: Intubation ?Date/Time: 06/28/2021 7:24 AM ?Performed by: Raenette Rover, CRNA ?Pre-anesthesia Checklist: Patient identified, Emergency Drugs available, Suction available and Patient being monitored ?Patient Re-evaluated:Patient Re-evaluated prior to induction ?Oxygen Delivery Method: Circle system utilized ?Preoxygenation: Pre-oxygenation with 100% oxygen ?Induction Type: IV induction ?Ventilation: Mask ventilation without difficulty ?Laryngoscope Size: Mac and 3 ?Grade View: Grade I ?Tube type: Oral ?Tube size: 7.0 mm ?Number of attempts: 1 ?Airway Equipment and Method: Stylet ?Placement Confirmation: ETT inserted through vocal cords under direct vision, positive ETCO2 and breath sounds checked- equal and bilateral ?Secured at: 22 cm ?Tube secured with: Tape ?Dental Injury: Teeth and Oropharynx as per pre-operative assessment  ? ? ? ? ?

## 2021-06-29 ENCOUNTER — Encounter (HOSPITAL_COMMUNITY): Payer: Self-pay | Admitting: Surgery

## 2021-06-29 DIAGNOSIS — Z79899 Other long term (current) drug therapy: Secondary | ICD-10-CM | POA: Diagnosis not present

## 2021-06-29 DIAGNOSIS — F17200 Nicotine dependence, unspecified, uncomplicated: Secondary | ICD-10-CM | POA: Diagnosis not present

## 2021-06-29 DIAGNOSIS — D351 Benign neoplasm of parathyroid gland: Secondary | ICD-10-CM | POA: Diagnosis not present

## 2021-06-29 DIAGNOSIS — E210 Primary hyperparathyroidism: Secondary | ICD-10-CM | POA: Diagnosis not present

## 2021-06-29 DIAGNOSIS — K219 Gastro-esophageal reflux disease without esophagitis: Secondary | ICD-10-CM | POA: Diagnosis not present

## 2021-06-29 DIAGNOSIS — I10 Essential (primary) hypertension: Secondary | ICD-10-CM | POA: Diagnosis not present

## 2021-06-29 LAB — BASIC METABOLIC PANEL
Anion gap: 9 (ref 5–15)
BUN: 24 mg/dL — ABNORMAL HIGH (ref 6–20)
CO2: 25 mmol/L (ref 22–32)
Calcium: 9 mg/dL (ref 8.9–10.3)
Chloride: 102 mmol/L (ref 98–111)
Creatinine, Ser: 1.23 mg/dL — ABNORMAL HIGH (ref 0.44–1.00)
GFR, Estimated: 51 mL/min — ABNORMAL LOW (ref >60)
Glucose, Bld: 97 mg/dL (ref 70–99)
Potassium: 3.7 mmol/L (ref 3.5–5.1)
Sodium: 136 mmol/L (ref 135–145)

## 2021-06-29 NOTE — Progress Notes (Signed)
Reviewed written d/c instructions w pt and all questions answered. She verbalized understanding. D/C via w/c w all belongings in stable condition. 

## 2021-06-29 NOTE — Progress Notes (Signed)
Patient ID: Sandra Trujillo, female   DOB: 04/04/1960, 61 y.o.   MRN: [id number] ? ? ?Looks great ?No issues overnight ?Neck incision clean, no hematoma ? ?Ca++ 9.0 ? ?Plan: ?Discharge home ?

## 2021-06-29 NOTE — Discharge Summary (Signed)
Physician Discharge Summary  ?Patient ID: ?Sandra Trujillo ?MRN: [id number] ?DOB/AGE: 59-Nov-1962 59 y.o. ? ?Admit date: 06/28/2021 ?Discharge date: 06/29/2021 ? ?Admission Diagnoses: ? ?Discharge Diagnoses:  ?Principal Problem: ?  Hyperparathyroidism, primary ([place]) ?Active Problems: ?  Primary hyperparathyroidism ([place]) ? ? ?Discharged Condition: good ? ?Hospital Course: uneventful post op recovery.  Discharged home POD#1 doing well ? ?Consults: None ? ?Significant Diagnostic Studies:  ? ?Treatments: surgery: neck exploration with parathyroidectomy, biopsy, reimplantation ? ?Discharge Exam: ?Blood pressure (!) 156/107, pulse 87, temperature (!) 97.4 ?F (36.3 ?C), temperature source Oral, resp. rate 20, height '5\' 4"'$  (1.626 m), weight 83 kg, SpO2 97 %. ?General appearance: alert, cooperative, and no distress ?Resp: clear to auscultation bilaterally ?Cardio: regular rate and rhythm, S1, S2 normal, no murmur, click, rub or gallop ?Incision/Wound:neck incision clean, no hematoma ? ?Disposition: Discharge disposition: 01-Home or Self Care ? ? ? ? ? ? ? ?Allergies as of 06/29/2021   ? ?   Reactions  ? Sulfa Antibiotics Other (See Comments)  ? "Flu like symptoms 10 times worse".  ? Lisinopril Cough  ? ?  ? ?  ?Medication List  ?  ? ?TAKE these medications   ? ?acetaminophen 500 MG tablet ?Commonly known as: TYLENOL ?Take 500 mg by mouth every 6 (six) hours as needed for mild pain. ?  ?Adbry 150 MG/ML Sosy ?Generic drug: Tralokinumab-ldrm ?Inject 150 mg into the skin every 14 (fourteen) days. ?  ?albuterol 108 (90 Base) MCG/ACT inhaler ?Commonly known as: VENTOLIN HFA ?Inhale 2 puffs into the lungs every 6 (six) hours as needed for wheezing or shortness of breath. ?  ?azelastine 0.1 % nasal spray ?Commonly known as: ASTELIN ?Place 2 sprays into both nostrils 2 (two) times daily. ?  ?chlorpheniramine 4 MG tablet ?Commonly known as: CHLOR-TRIMETON ?Take 4 mg by mouth at bedtime. ?  ?clobetasol ointment 0.05 % ?Commonly known as:  TEMOVATE ?Apply 1 application. topically 2 (two) times daily as needed (rash). ?  ?docusate sodium 100 MG capsule ?Commonly known as: COLACE ?Take 100 mg by mouth daily. ?  ?fluticasone 50 MCG/ACT nasal spray ?Commonly known as: FLONASE ?SHAKE LIQUID AND USE 2 SPRAYS IN EACH NOSTRIL DAILY ?What changed:  ?how much to take ?how to take this ?when to take this ?additional instructions ?  ?ibuprofen 200 MG tablet ?Commonly known as: ADVIL ?Take 800 mg by mouth every 6 (six) hours as needed for mild pain or headache. ?  ?indapamide 2.5 MG tablet ?Commonly known as: LOZOL ?TAKE 1 TABLET(2.5 MG) BY MOUTH DAILY ?What changed:  ?how much to take ?how to take this ?when to take this ?additional instructions ?  ?losartan 100 MG tablet ?Commonly known as: COZAAR ?TAKE 1 TABLET BY MOUTH EVERY DAY ?What changed:  ?how much to take ?how to take this ?when to take this ?additional instructions ?  ?mometasone 0.1 % cream ?Commonly known as: ELOCON ?Apply 1 application. topically 2 (two) times daily as needed for rash. ?  ?pantoprazole 40 MG tablet ?Commonly known as: PROTONIX ?Take 1 tablet (40 mg total) by mouth 2 (two) times daily before a meal. ?  ?rosuvastatin 40 MG tablet ?Commonly known as: CRESTOR ?Take 1 tablet (40 mg total) by mouth daily. ?  ?traMADol 50 MG tablet ?Commonly known as: ULTRAM ?Take 1-2 tablets (50-100 mg total) by mouth every 6 (six) hours as needed for moderate pain. ?  ?varenicline 1 MG tablet ?Commonly known as: CHANTIX ?Take 1 mg by mouth 2 (two) times daily. ?  ?  Vitamin D 50 MCG (2000 UT) Caps ?Take 2,000 Units by mouth daily. ?  ? ?  ? ? Follow-up Information   ? ? Armandina Gemma, MD. Schedule an appointment as soon as possible for a visit in 3 week(s).   ?Specialty: General Surgery ?Why: For wound re-check ?Contact information: ?[address] ?[address] ?[place] [address] ?[phone] ? ? ?  ?  ? ?  ?  ? ?  ? ? ?Signed: ?Coralie Keens ?06/29/2021, 9:31 AM ? ? ?

## 2021-07-01 LAB — SURGICAL PATHOLOGY

## 2021-07-16 DIAGNOSIS — E892 Postprocedural hypoparathyroidism: Secondary | ICD-10-CM | POA: Diagnosis not present

## 2021-07-17 DIAGNOSIS — Z9889 Other specified postprocedural states: Secondary | ICD-10-CM | POA: Insufficient documentation

## 2021-07-24 DIAGNOSIS — L2089 Other atopic dermatitis: Secondary | ICD-10-CM | POA: Diagnosis not present

## 2021-07-24 DIAGNOSIS — Z79899 Other long term (current) drug therapy: Secondary | ICD-10-CM | POA: Diagnosis not present

## 2021-07-27 DIAGNOSIS — M5136 Other intervertebral disc degeneration, lumbar region: Secondary | ICD-10-CM | POA: Diagnosis not present

## 2021-07-27 DIAGNOSIS — M62830 Muscle spasm of back: Secondary | ICD-10-CM | POA: Diagnosis not present

## 2021-07-27 DIAGNOSIS — M9903 Segmental and somatic dysfunction of lumbar region: Secondary | ICD-10-CM | POA: Diagnosis not present

## 2021-07-27 DIAGNOSIS — M9901 Segmental and somatic dysfunction of cervical region: Secondary | ICD-10-CM | POA: Diagnosis not present

## 2021-07-27 DIAGNOSIS — M5412 Radiculopathy, cervical region: Secondary | ICD-10-CM | POA: Diagnosis not present

## 2021-07-27 DIAGNOSIS — M955 Acquired deformity of pelvis: Secondary | ICD-10-CM | POA: Diagnosis not present

## 2021-07-27 DIAGNOSIS — M9905 Segmental and somatic dysfunction of pelvic region: Secondary | ICD-10-CM | POA: Diagnosis not present

## 2021-08-08 DIAGNOSIS — M5412 Radiculopathy, cervical region: Secondary | ICD-10-CM | POA: Diagnosis not present

## 2021-08-08 DIAGNOSIS — M9901 Segmental and somatic dysfunction of cervical region: Secondary | ICD-10-CM | POA: Diagnosis not present

## 2021-08-08 DIAGNOSIS — M955 Acquired deformity of pelvis: Secondary | ICD-10-CM | POA: Diagnosis not present

## 2021-08-08 DIAGNOSIS — M62830 Muscle spasm of back: Secondary | ICD-10-CM | POA: Diagnosis not present

## 2021-08-08 DIAGNOSIS — M9905 Segmental and somatic dysfunction of pelvic region: Secondary | ICD-10-CM | POA: Diagnosis not present

## 2021-08-08 DIAGNOSIS — M5136 Other intervertebral disc degeneration, lumbar region: Secondary | ICD-10-CM | POA: Diagnosis not present

## 2021-08-08 DIAGNOSIS — M9903 Segmental and somatic dysfunction of lumbar region: Secondary | ICD-10-CM | POA: Diagnosis not present

## 2021-08-24 DIAGNOSIS — M955 Acquired deformity of pelvis: Secondary | ICD-10-CM | POA: Diagnosis not present

## 2021-08-24 DIAGNOSIS — M5412 Radiculopathy, cervical region: Secondary | ICD-10-CM | POA: Diagnosis not present

## 2021-08-24 DIAGNOSIS — M9903 Segmental and somatic dysfunction of lumbar region: Secondary | ICD-10-CM | POA: Diagnosis not present

## 2021-08-24 DIAGNOSIS — M5136 Other intervertebral disc degeneration, lumbar region: Secondary | ICD-10-CM | POA: Diagnosis not present

## 2021-08-24 DIAGNOSIS — M9901 Segmental and somatic dysfunction of cervical region: Secondary | ICD-10-CM | POA: Diagnosis not present

## 2021-08-24 DIAGNOSIS — M62830 Muscle spasm of back: Secondary | ICD-10-CM | POA: Diagnosis not present

## 2021-08-24 DIAGNOSIS — M9905 Segmental and somatic dysfunction of pelvic region: Secondary | ICD-10-CM | POA: Diagnosis not present

## 2021-08-28 DIAGNOSIS — E210 Primary hyperparathyroidism: Secondary | ICD-10-CM | POA: Diagnosis not present

## 2021-08-29 LAB — PTH, INTACT AND CALCIUM
Calcium: 10.3 mg/dL — ABNORMAL HIGH (ref 8.7–10.2)
PTH: 35 pg/mL (ref 15–65)

## 2021-09-04 DIAGNOSIS — Z8041 Family history of malignant neoplasm of ovary: Secondary | ICD-10-CM | POA: Diagnosis not present

## 2021-09-04 DIAGNOSIS — Z1231 Encounter for screening mammogram for malignant neoplasm of breast: Secondary | ICD-10-CM | POA: Diagnosis not present

## 2021-09-04 DIAGNOSIS — Z6831 Body mass index (BMI) 31.0-31.9, adult: Secondary | ICD-10-CM | POA: Diagnosis not present

## 2021-09-04 DIAGNOSIS — Z124 Encounter for screening for malignant neoplasm of cervix: Secondary | ICD-10-CM | POA: Diagnosis not present

## 2021-09-04 DIAGNOSIS — Z01419 Encounter for gynecological examination (general) (routine) without abnormal findings: Secondary | ICD-10-CM | POA: Diagnosis not present

## 2021-09-04 LAB — HM MAMMOGRAPHY: HM Mammogram: NORMAL (ref 0–4)

## 2021-09-05 ENCOUNTER — Encounter: Payer: Self-pay | Admitting: "Endocrinology

## 2021-09-05 ENCOUNTER — Ambulatory Visit (INDEPENDENT_AMBULATORY_CARE_PROVIDER_SITE_OTHER): Payer: BC Managed Care – PPO | Admitting: "Endocrinology

## 2021-09-05 DIAGNOSIS — E210 Primary hyperparathyroidism: Secondary | ICD-10-CM | POA: Diagnosis not present

## 2021-09-05 NOTE — Progress Notes (Signed)
09/05/2021, 2:35 PM   Endocrinology follow-up note  Sandra Trujillo is a 62 y.o.-year-old female, referred by her  Sandra Drown, MD  . -She is here for follow-up after she was seen in consultation for evaluation for hypercalcemia/hyperparathyroidism.   Past Medical History:  Diagnosis Date   Anemia    GERD (gastroesophageal reflux disease)    Hyperlipidemia    Hyperparathyroidism ([place])    Hypertension     Past Surgical History:  Procedure Laterality Date   CERVICAL DISCECTOMY     COLONOSCOPY N/A 09/15/2014   SLF: 1. submucosal lesion in the hepatic flexure likely a lipoma 2 . mild diverticulosis in teh sigmoid colon and descending colon 3. the left colon is redundant   COLONOSCOPY WITH ESOPHAGOGASTRODUODENOSCOPY (EGD)  12/19/2002   UTM:LYYTK HH/mildchanges of reflux/otherwise normal. normal colonoscopy and terminal ileoscopy.   ESOPHAGEAL DILATION N/A 04/02/2015   Procedure: ESOPHAGEAL DILATION;  Surgeon: Danie Binder, MD;  [facility];  Service: Endoscopy;  Laterality: N/A;   ESOPHAGOGASTRODUODENOSCOPY N/A 09/15/2014   SLF: 1. Schatzki ring at the gastroesophagel junction 2. moderate erosive gastritis/duoentitit and three small gastric ulcers due to ASA/ibuprofen   ESOPHAGOGASTRODUODENOSCOPY N/A 04/02/2015   Procedure: ESOPHAGOGASTRODUODENOSCOPY (EGD);  Surgeon: Danie Binder, MD;  [facility];  Service: Endoscopy;  Laterality: N/A;  1315 - moved to 2/13 @ 2:00   PARATHYROIDECTOMY N/A 06/28/2021   Procedure: NECK EXPLORATION WITH  LEFT SUPERIOR AND RIGHT INFERIOR PARATHYROIDECTOMY BIOPSY OF  LEFT INFERIOR PARATHYROID, AUTOTRANSPLANTATION OF LEFT INFERIOR PARATHYROID TO SCM;  Surgeon: Armandina Gemma, MD;  Location: [facility];  Service: General;  Laterality: N/A;   SAVORY DILATION N/A 09/15/2014   Procedure: SAVORY DILATION;  Surgeon: Danie Binder, MD;  Location: [facility];  Service: Endoscopy;   Laterality: N/A;   WISDOM TOOTH EXTRACTION      Social History   Tobacco Use   Smoking status: Every Day    Packs/day: 0.50    Years: 25.00    Total pack years: 12.50    Types: Cigarettes   Smokeless tobacco: Never   Tobacco comments:    1/2 ppd per patient 04/30/2021 MR   Vaping Use   Vaping Use: Never used  Substance Use Topics   Alcohol use: No    Alcohol/week: 0.0 standard drinks of alcohol   Drug use: No    Family History  Problem Relation Age of Onset   Hypertension Father    Heart failure Mother    Stroke Mother    Hypertension Mother    Hyperlipidemia Mother    Colon cancer Neg Hx    Inflammatory bowel disease Neg Hx    Liver disease Neg Hx     Outpatient Encounter Medications as of 09/05/2021  Medication Sig   acetaminophen (TYLENOL) 500 MG tablet Take 500 mg by mouth every 6 (six) hours as needed for mild pain.   ADBRY 150 MG/ML SOSY Inject 150 mg into the skin every 14 (fourteen) days.   albuterol (VENTOLIN HFA) 108 (90 Base) MCG/ACT inhaler Inhale 2 puffs into the lungs every 6 (six) hours as needed for wheezing or shortness of breath.  azelastine (ASTELIN) 0.1 % nasal spray Place 2 sprays into both nostrils 2 (two) times daily.   chlorpheniramine (CHLOR-TRIMETON) 4 MG tablet Take 4 mg by mouth at bedtime.   Cholecalciferol (VITAMIN D) 2000 UNITS CAPS Take 2,000 Units by mouth daily.   clobetasol ointment (TEMOVATE) 9.24 % Apply 1 application. topically 2 (two) times daily as needed (rash).   docusate sodium (COLACE) 100 MG capsule Take 100 mg by mouth daily.   fluticasone (FLONASE) 50 MCG/ACT nasal spray SHAKE LIQUID AND USE 2 SPRAYS IN EACH NOSTRIL DAILY (Patient taking differently: Place 2 sprays into both nostrils daily.)   ibuprofen (ADVIL) 200 MG tablet Take 800 mg by mouth every 6 (six) hours as needed for mild pain or headache.   indapamide (LOZOL) 2.5 MG tablet TAKE 1 TABLET(2.5 MG) BY MOUTH DAILY (Patient taking differently: Take 2.5 mg by mouth  daily.)   losartan (COZAAR) 100 MG tablet TAKE 1 TABLET BY MOUTH EVERY DAY (Patient taking differently: Take 100 mg by mouth daily.)   mometasone (ELOCON) 0.1 % cream Apply 1 application. topically 2 (two) times daily as needed for rash.   pantoprazole (PROTONIX) 40 MG tablet Take 1 tablet (40 mg total) by mouth 2 (two) times daily before a meal.   rosuvastatin (CRESTOR) 40 MG tablet Take 1 tablet (40 mg total) by mouth daily.   traMADol (ULTRAM) 50 MG tablet Take 1-2 tablets (50-100 mg total) by mouth every 6 (six) hours as needed for moderate pain.   varenicline (CHANTIX) 1 MG tablet Take 1 mg by mouth 2 (two) times daily.   No facility-administered encounter medications on file as of 09/05/2021.    Allergies  Allergen Reactions   Sulfa Antibiotics Other (See Comments)    "Flu like symptoms 10 times worse".   Lisinopril Cough     HPI  Sandra Trujillo was first diagnosed with hypercalcemia in 2019.  After appropriate work-up to confirm hyperparathyroidism, she was sent for surgical intervention during her last visit.  She underwent parathyroidectomy on Jun 28, 2021.  Surgical results where right inferior and left superior gland was removed, showing adenoma.  Her calcium was normalized at 9 mg per DL after her surgery, however more recently slightly increased to 10.3 associated with normal PTH of 35.   She has no new complaints today. She has normal bone density.    No prior history of fragility fractures or falls. No history of  kidney stones. She has no new complaints today. No history of CKD.   she is not on HCTZ or other thiazide therapy.  She has history of vitamin D deficiency, currently on vitamin D supplement 2000 units daily.  No reported change. she is not on calcium supplements,  she eats dairy and green, leafy, vegetables on average amounts.  she does not have a family history of hypercalcemia, pituitary tumors, thyroid cancer, or osteoporosis.   I reviewed her chart and  she also has a history of hypertension, hyperlipidemia on treatment.    ROS: Dated as above. PE: BP 124/66   Pulse (!) 104   Ht '5\' 4"'$  (1.626 m)   Wt 183 lb 12.8 oz (83.4 kg)   BMI 31.55 kg/m , Body mass index is 31.55 kg/m. Wt Readings from Last 3 Encounters:  09/05/21 183 lb 12.8 oz (83.4 kg)  06/28/21 183 lb (83 kg)  06/18/21 183 lb (83 kg)     Physical Exam- Limited  Healing surgical scar. CMP ( most recent) CMP  Component Value Date/Time   NA 136 06/29/2021 0500   NA 141 01/07/2021 0827   NA 140 01/10/2013 1120   K 3.7 06/29/2021 0500   K 4.2 01/10/2013 1120   CL 102 06/29/2021 0500   CL 109 (H) 01/10/2013 1120   CO2 25 06/29/2021 0500   CO2 25 01/10/2013 1120   GLUCOSE 97 06/29/2021 0500   GLUCOSE 89 01/10/2013 1120   BUN 24 (H) 06/29/2021 0500   BUN 23 01/07/2021 0827   BUN 9 01/10/2013 1120   CREATININE 1.23 (H) 06/29/2021 0500   CREATININE 1.07 01/10/2013 1120   CREATININE 1.19 (H) 12/30/2012 0914   CALCIUM 10.3 (H) 08/28/2021 1333   CALCIUM 9.0 01/10/2013 1120   PROT 6.7 01/07/2021 0827   ALBUMIN 4.7 01/07/2021 0827   AST 16 01/07/2021 0827   ALT 21 01/07/2021 0827   ALKPHOS 92 01/07/2021 0827   BILITOT 0.5 01/07/2021 0827   GFRNONAA 51 (L) 06/29/2021 0500   GFRNONAA >60 01/10/2013 1120   GFRAA 56 (L) 07/20/2019 1024   GFRAA >60 01/10/2013 1120     Diabetic Labs (most recent): Lab Results  Component Value Date   HGBA1C (H) 06/02/2009    5.9 (NOTE)                                                                       According to the ADA Clinical Practice Recommendations for 2011, when HbA1c is used as a screening test:   >=6.5%   Diagnostic of Diabetes Mellitus           (if abnormal result  is confirmed)  5.7-6.4%   Increased risk of developing Diabetes Mellitus  References:Diagnosis and Classification of Diabetes Mellitus,Diabetes FWYO,3785,88(FOYDX 1):S62-S69 and Standards of Medical Care in         Diabetes - 2011,Diabetes Care,2011,34   (Suppl 1):S11-S61.     Lipid Panel ( most recent) Lipid Panel     Component Value Date/Time   CHOL 159 01/07/2021 0823   TRIG 87 01/07/2021 0823   HDL 50 01/07/2021 0823   CHOLHDL 3.2 01/07/2021 0823   CHOLHDL 3.7 12/30/2012 0914   VLDL 17 12/30/2012 0914   LDLCALC 93 01/07/2021 0823   LABVLDL 16 01/07/2021 0823     Recent Results (from the past 2160 hour(s))  Basic metabolic panel per protocol     Status: Abnormal   Collection Time: 06/18/21  8:23 AM  Result Value Ref Range   Sodium 140 135 - 145 mmol/L   Potassium 4.2 3.5 - 5.1 mmol/L   Chloride 107 98 - 111 mmol/L   CO2 27 22 - 32 mmol/L   Glucose, Bld 96 70 - 99 mg/dL    Comment: Glucose reference range applies only to samples taken after fasting for at least 8 hours.   BUN 24 (H) 6 - 20 mg/dL   Creatinine, Ser 1.20 (H) 0.44 - 1.00 mg/dL   Calcium 10.4 (H) 8.9 - 10.3 mg/dL   GFR, Estimated 52 (L) >60 mL/min    Comment: (NOTE) Calculated using the CKD-EPI Creatinine Equation (2021)    Anion gap 6 5 - 15    Comment: Performed at [facility], [place] [address], [place], [place] [address]  CBC per  protocol     Status: None   Collection Time: 06/18/21  8:23 AM  Result Value Ref Range   WBC 6.1 4.0 - 10.5 K/uL   RBC 4.72 3.87 - 5.11 MIL/uL   Hemoglobin 14.3 12.0 - 15.0 g/dL   HCT 42.7 36.0 - 46.0 %   MCV 90.5 80.0 - 100.0 fL   MCH 30.3 26.0 - 34.0 pg   MCHC 33.5 30.0 - 36.0 g/dL   RDW 12.1 11.5 - 15.5 %   Platelets 263 150 - 400 K/uL   nRBC 0.0 0.0 - 0.2 %    Comment: Performed at [facility], [place] [address], [place], [place] [address]  Pregnancy, urine     Status: None   Collection Time: 06/28/21  6:10 AM  Result Value Ref Range   Preg Test, Ur NEGATIVE NEGATIVE    Comment:        THE SENSITIVITY OF THIS METHODOLOGY IS >20 mIU/mL. Performed at [facility], [place] [address] [place], [place] [address]   Surgical pathology     Status: None   Collection  Time: 06/28/21  7:51 AM  Result Value Ref Range   SURGICAL PATHOLOGY      SURGICAL PATHOLOGY CASE: [id number] PATIENT: Hanny Haliburton Surgical Pathology Report     Clinical History: Primary hyperparathyroidism (crm)     FINAL MICROSCOPIC DIAGNOSIS:  A. PARATHYROID, LEFT SUPERIOR, ADENOMA, PARARTHYROIDECTOMY: Hypercellular parathyroid compatible with adenoma  B. PARATHYROID, LEFT CAROTID SHEATH NODULE, PARARTHYROIDECTOMY: Ectopic hypercellular parathyroid  C. PARATHYROID, RIGHT INFERIOR, ADENOMA, PARARTHYROIDECTOMY: Hypercellular parathyroid compatible with adenoma  D. PARATHYROID, REMAINING RIGHT INFERIOR, PARARTHYROIDECTOMY: Benign parathyroid tissue with adjacent benign lymph node   INTRAOPERATIVE DIAGNOSIS:  A.  Left superior parathyroid adenoma: "Parathyroid tissue, favor adenoma" Intraoperative diagnosis rendered by Dr. Saralyn Pilar at 9:03 AM on 06/28/2021.  B.  Left carotid sheath nodule: "Parathyroid tissue" Intraoperative diagnosis rendered by Dr. Saralyn Pilar at 9:03 AM on 06/28/2021.  C.  Right inferior parathyroid: "Pa rathyroid tissue" Intraoperative diagnosis rendered by Dr. Saralyn Pilar at 9:03 AM on 06/28/2021.  GROSS DESCRIPTION:  A.  Received fresh is a portion of red-pink soft tissue, consistent with a parathyroid, measuring 1.5 x 1.0 x 0.5 cm and weighing 0.349 g.  The specimen is entirely submitted for frozen section and subsequently transferred into 1 block for routine histology.  B.  Received fresh is a portion of red-pink soft tissue, measuring 0.7 x 0.6 x 0.3 cm and weighing 0.044 g.  The specimen is entirely submitted for frozen section and subsequently transferred into 1 block for routine histology.  C.  Received fresh is a portion of red-pink soft tissue, consistent with a parathyroid, measuring 0.8 x 0.6 x 0.3 cm and weighing 0.060 g.  The specimen is entirely submitted for frozen section and subsequently transferred into 1 block for routine  histology.  D.  Received fresh is a 1.0 x 0.7 x 0.2 cm, 0.126 g portion of red-pink soft tissue consistent with a parathyroid.   The specimen is entirely submitted in D1. (KW, 06/28/2021)   Final Diagnosis performed by Tobin Chad, MD.   Electronically signed 07/01/2021 Technical component performed at [facility], [place] [address] [place], [place] [address].  Professional component performed at [employer]. [facility], [place] [address] [place], [place] [address].  Immunohistochemistry Technical component (if applicable) was performed at [facility]. [address], [place], [place], [place] [address].   IMMUNOHISTOCHEMISTRY DISCLAIMER (if applicable): Some of these immunohistochemical  stains may have been developed and the performance characteristics determine by [facility]. Some may not have been cleared or approved by the U.S. Food and Drug Administration. The FDA has determined that such clearance or approval is not necessary. This test is used for clinical purposes. It should not be regarded as investigational or for research. Th is laboratory is certified under the Clinical Laboratory Improvement Amendments of 1988 (CLIA-88) as qualified to perform high complexity clinical laboratory testing.  The controls stained appropriately.   Basic metabolic panel     Status: Abnormal   Collection Time: 06/29/21  5:00 AM  Result Value Ref Range   Sodium 136 135 - 145 mmol/L   Potassium 3.7 3.5 - 5.1 mmol/L   Chloride 102 98 - 111 mmol/L   CO2 25 22 - 32 mmol/L   Glucose, Bld 97 70 - 99 mg/dL    Comment: Glucose reference range applies only to samples taken after fasting for at least 8 hours.   BUN 24 (H) 6 - 20 mg/dL   Creatinine, Ser 1.23 (H) 0.44 - 1.00 mg/dL   Calcium 9.0 8.9 - 10.3 mg/dL   GFR, Estimated 51 (L) >60 mL/min    Comment: (NOTE) Calculated using the CKD-EPI Creatinine Equation (2021)    Anion gap 9 5 - 15     Comment: Performed at [facility], [place] [address] [place], [place] [address]  PTH, intact and calcium     Status: Abnormal   Collection Time: 08/28/21  1:33 PM  Result Value Ref Range   Calcium 10.3 (H) 8.7 - 10.2 mg/dL   PTH 35 15 - 65 pg/mL   PTH Interp Comment     Comment: Interpretation                 Intact PTH    Calcium                                 (pg/mL)      (mg/dL) Normal                          15 - 65     8.6 - 10.2 Primary Hyperparathyroidism         >65          >10.2 Secondary Hyperparathyroidism       >65          <10.2 Non-Parathyroid Hypercalcemia       <65          >10.2 Hypoparathyroidism                  <15          < 8.6 Non-Parathyroid Hypocalcemia    15 - 65          < 8.6       Assessment: 1. Hypercalcemia / Hyperparathyroidism  Plan: She is status post parathyroidectomy with removal of right inferior and left superior gland's with benign outcomes.  Her postsurgical PTH is normal at 35, however calcium rebounds to 10.3.  She will be kept on observation.   - No apparent complications from hypercalcemia/hyperparathyroidism: no history of  nephrolithiasis,  osteoporosis,fragility fractures. No abdominal pain, no major mood disorders, no bone pain. She will have repeat PTH/calcium, PTH RP and thyroid function test before her next visit in 3 months.  She is advised to  continue follow-up with her PMD Dr. Sallee Lange.   I spent 21 minutes in the care of the patient today including review of labs from Thyroid Function, CMP, and other relevant labs ; imaging/biopsy records (current and previous including abstractions from other facilities); face-to-face time discussing  her lab results and symptoms, medications doses, her options of short and long term treatment based on the latest standards of care / guidelines;   and documenting the encounter.  Victorino Sparrow Abramo  participated in the discussions, expressed understanding, and voiced  agreement with the above plans.  All questions were answered to her satisfaction. she is encouraged to contact clinic should she have any questions or concerns prior to her return visit.    - Return in about 3 months (around 12/06/2021) for F/U with Pre-visit Labs.   Glade Lloyd, MD [facility] [address] [place], [place] [address] Phone: [phone]  Fax: [phone]    This note was partially dictated with voice recognition software. Similar sounding words can be transcribed inadequately or may not  be corrected upon review.  09/05/2021, 2:35 PM

## 2021-09-09 ENCOUNTER — Encounter: Payer: Self-pay | Admitting: Family Medicine

## 2021-09-23 DIAGNOSIS — M955 Acquired deformity of pelvis: Secondary | ICD-10-CM | POA: Diagnosis not present

## 2021-09-23 DIAGNOSIS — M62830 Muscle spasm of back: Secondary | ICD-10-CM | POA: Diagnosis not present

## 2021-09-23 DIAGNOSIS — M9905 Segmental and somatic dysfunction of pelvic region: Secondary | ICD-10-CM | POA: Diagnosis not present

## 2021-09-23 DIAGNOSIS — M9901 Segmental and somatic dysfunction of cervical region: Secondary | ICD-10-CM | POA: Diagnosis not present

## 2021-09-23 DIAGNOSIS — M5136 Other intervertebral disc degeneration, lumbar region: Secondary | ICD-10-CM | POA: Diagnosis not present

## 2021-09-23 DIAGNOSIS — M5412 Radiculopathy, cervical region: Secondary | ICD-10-CM | POA: Diagnosis not present

## 2021-09-23 DIAGNOSIS — M9903 Segmental and somatic dysfunction of lumbar region: Secondary | ICD-10-CM | POA: Diagnosis not present

## 2021-09-26 ENCOUNTER — Encounter: Payer: Self-pay | Admitting: Family Medicine

## 2021-10-07 DIAGNOSIS — I10 Essential (primary) hypertension: Secondary | ICD-10-CM | POA: Diagnosis not present

## 2021-10-07 DIAGNOSIS — F17219 Nicotine dependence, cigarettes, with unspecified nicotine-induced disorders: Secondary | ICD-10-CM | POA: Diagnosis not present

## 2021-10-08 ENCOUNTER — Ambulatory Visit: Payer: BC Managed Care – PPO | Admitting: Family Medicine

## 2021-10-08 VITALS — BP 130/83 | Ht 64.0 in | Wt 182.4 lb

## 2021-10-08 DIAGNOSIS — F4321 Adjustment disorder with depressed mood: Secondary | ICD-10-CM

## 2021-10-08 DIAGNOSIS — F321 Major depressive disorder, single episode, moderate: Secondary | ICD-10-CM

## 2021-10-08 MED ORDER — LORAZEPAM 0.5 MG PO TABS: ORAL_TABLET | ORAL | 0 refills | Status: DC

## 2021-10-08 MED ORDER — TRAZODONE HCL 50 MG PO TABS: 25.0000 mg | ORAL_TABLET | Freq: Every evening | ORAL | 3 refills | Status: DC | PRN

## 2021-10-08 NOTE — Progress Notes (Signed)
   Subjective:    Patient ID: Sandra Trujillo, female    DOB: 1960/11/27, 61 y.o.   MRN: [id number]  HPI  Patient arrives to discuss getting FMLA filled out for work after the loss of her husband. Patient states she is also having a difficult time sleeping.  She lost her husband Aug 15, 2021.  It was a sudden death thought to be either a stroke heart attack or possibly a fall nonetheless she has been going through grief sadness and separation regarding all of this she is hoping that things will get better but she finds himself feeling rundown tired fatigue and depressed she is going through counseling currently states it is helping she has a difficult time sleeping she is not suicidal  She also states with her job it is very difficult for her to attend to everything that needs to be done with his estate  She denies being suicidal Review of Systems     Objective:   Physical Exam Lungs clear heart regular pulse normal BP good  WE 28 thru sept      Assessment & Plan:  Agree Recommend ongoing counseling Depression Trazodone at nighttime to help with depression and help with sleep Counseling will help as well Patient to give [place] an update within 2 to 3 weeks and she has a follow-up visit at the end of September Patient not suicidal we did discuss warning signs to watch for regarding suicidal In addition to this patient to utilize lorazepam at nighttime if necessary to help her with sleep caution drowsiness not for long-term use not with driving

## 2021-10-10 ENCOUNTER — Encounter: Payer: Self-pay | Admitting: Family Medicine

## 2021-10-10 ENCOUNTER — Telehealth: Payer: Self-pay | Admitting: Family Medicine

## 2021-10-10 NOTE — Telephone Encounter (Signed)
Nurses-I did do her FMLA It does have 1 yellow sticky note that needs further answers please fill that and then it is finished and ready for her to pick up

## 2021-10-14 ENCOUNTER — Telehealth: Payer: Self-pay | Admitting: Family Medicine

## 2021-10-14 NOTE — Telephone Encounter (Signed)
Patient has not taking any time off work yet.,her first day out is 8/30 also she is needing her paper work to be intermittent also. Papers are in your folder

## 2021-10-14 NOTE — Telephone Encounter (Signed)
Form was completed thank you 

## 2021-10-14 NOTE — Telephone Encounter (Signed)
Per Danae Chen; placed back in provider office because pt requesting "intermittent" to be placed on form. Danae Chen states she did call pt this morning.

## 2021-10-15 NOTE — Telephone Encounter (Signed)
Form has been faxed.

## 2021-10-16 NOTE — Telephone Encounter (Signed)
I did work on this form I did include intermittent aspects within the leave of absence when she has to miss work for 1 to 3 days/month with flareups or doctor appointments or counseling etc. hopefully that suffices if something more is needed feel free to let us know

## 2021-11-14 ENCOUNTER — Ambulatory Visit: Payer: Self-pay | Admitting: Family Medicine

## 2021-11-15 ENCOUNTER — Encounter: Payer: Self-pay | Admitting: Family Medicine

## 2021-11-15 ENCOUNTER — Ambulatory Visit (INDEPENDENT_AMBULATORY_CARE_PROVIDER_SITE_OTHER): Payer: BC Managed Care – PPO | Admitting: Family Medicine

## 2021-11-15 VITALS — BP 130/84 | Wt 179.2 lb

## 2021-11-15 DIAGNOSIS — F320 Major depressive disorder, single episode, mild: Secondary | ICD-10-CM

## 2021-11-15 DIAGNOSIS — F4321 Adjustment disorder with depressed mood: Secondary | ICD-10-CM | POA: Diagnosis not present

## 2021-11-15 MED ORDER — TRAZODONE HCL 100 MG PO TABS: 100.0000 mg | ORAL_TABLET | Freq: Every evening | ORAL | 1 refills | Status: DC | PRN

## 2021-11-15 NOTE — Patient Instructions (Signed)

## 2021-11-15 NOTE — Progress Notes (Signed)
   Subjective:    Patient ID: Sandra Trujillo, female    DOB: 05/10/1960, 61 y.o.   MRN: [id number]  HPI Pt arrives for follow up on HTN. Pt states things are going well. No issues at this time. Doing well on medications.  Still going through a lot of grief from the loss of her husband Denies any severe depression but working her way through the emotions as best she can She is doing counseling once a week She is also doing grief share once week She feels like she is making some improvements unable to go back to work just yet She is working with her superior regarding returning to work on a partial basis She is having difficult time sleeping even with the trazodone Review of Systems     Objective:   Physical Exam  General-in no acute distress Eyes-no discharge Lungs-respiratory rate normal, CTA CV-no murmurs,RRR Extremities skin warm dry no edema Neuro grossly normal Behavior normal, alert       Assessment & Plan:  She is getting some improvement but not enough Bump up dose of trazodone 100 mg each evening this should help with sleep Continue the counseling's Follow-up here within a few weeks if not doing better otherwise follow-up within 3 months Work with her workplace on return to work schedule hopefully within the next few weeks she will be able to return to work but this depends on her progress She will keep [place] posted if she needs additional FMLA she will let us know  Was told if she gets worse follow-up immediately

## 2021-11-23 DIAGNOSIS — M9905 Segmental and somatic dysfunction of pelvic region: Secondary | ICD-10-CM | POA: Diagnosis not present

## 2021-11-23 DIAGNOSIS — M5412 Radiculopathy, cervical region: Secondary | ICD-10-CM | POA: Diagnosis not present

## 2021-11-23 DIAGNOSIS — M9903 Segmental and somatic dysfunction of lumbar region: Secondary | ICD-10-CM | POA: Diagnosis not present

## 2021-11-23 DIAGNOSIS — M5136 Other intervertebral disc degeneration, lumbar region: Secondary | ICD-10-CM | POA: Diagnosis not present

## 2021-11-23 DIAGNOSIS — M9901 Segmental and somatic dysfunction of cervical region: Secondary | ICD-10-CM | POA: Diagnosis not present

## 2021-11-23 DIAGNOSIS — M955 Acquired deformity of pelvis: Secondary | ICD-10-CM | POA: Diagnosis not present

## 2021-11-23 DIAGNOSIS — M62830 Muscle spasm of back: Secondary | ICD-10-CM | POA: Diagnosis not present

## 2021-12-05 ENCOUNTER — Other Ambulatory Visit: Payer: Self-pay | Admitting: Family Medicine

## 2021-12-05 DIAGNOSIS — E042 Nontoxic multinodular goiter: Secondary | ICD-10-CM | POA: Diagnosis not present

## 2021-12-05 DIAGNOSIS — E210 Primary hyperparathyroidism: Secondary | ICD-10-CM | POA: Diagnosis not present

## 2021-12-09 ENCOUNTER — Telehealth: Payer: Self-pay | Admitting: "Endocrinology

## 2021-12-09 ENCOUNTER — Encounter: Payer: Self-pay | Admitting: "Endocrinology

## 2021-12-09 ENCOUNTER — Ambulatory Visit (INDEPENDENT_AMBULATORY_CARE_PROVIDER_SITE_OTHER): Payer: BC Managed Care – PPO | Admitting: "Endocrinology

## 2021-12-09 VITALS — BP 124/86 | HR 88 | Ht 64.0 in | Wt 178.0 lb

## 2021-12-09 DIAGNOSIS — E210 Primary hyperparathyroidism: Secondary | ICD-10-CM | POA: Diagnosis not present

## 2021-12-09 NOTE — Progress Notes (Signed)
12/09/2021, 7:05 PM   Endocrinology follow-up note  Sandra Trujillo is a 61 y.o.-year-old female, referred by her  Sandra Drown, MD  . -She is here for follow-up after she was seen in consultation for evaluation for hypercalcemia/hyperparathyroidism.   Past Medical History:  Diagnosis Date   Anemia    GERD (gastroesophageal reflux disease)    Hyperlipidemia    Hyperparathyroidism ([place])    Hypertension     Past Surgical History:  Procedure Laterality Date   CERVICAL DISCECTOMY     COLONOSCOPY N/A 09/15/2014   SLF: 1. submucosal lesion in the hepatic flexure likely a lipoma 2 . mild diverticulosis in teh sigmoid colon and descending colon 3. the left colon is redundant   COLONOSCOPY WITH ESOPHAGOGASTRODUODENOSCOPY (EGD)  12/19/2002   CVE:LFYBO HH/mildchanges of reflux/otherwise normal. normal colonoscopy and terminal ileoscopy.   ESOPHAGEAL DILATION N/A 04/02/2015   Procedure: ESOPHAGEAL DILATION;  Surgeon: Sandra Binder, MD;  Location: [facility];  Service: Endoscopy;  Laterality: N/A;   ESOPHAGOGASTRODUODENOSCOPY N/A 09/15/2014   SLF: 1. Schatzki ring at the gastroesophagel junction 2. moderate erosive gastritis/duoentitit and three small gastric ulcers due to ASA/ibuprofen   ESOPHAGOGASTRODUODENOSCOPY N/A 04/02/2015   Procedure: ESOPHAGOGASTRODUODENOSCOPY (EGD);  Surgeon: Sandra Binder, MD;  Location: [facility];  Service: Endoscopy;  Laterality: N/A;  1315 - moved to 2/13 @ 2:00   PARATHYROIDECTOMY N/A 06/28/2021   Procedure: NECK EXPLORATION WITH  LEFT SUPERIOR AND RIGHT INFERIOR PARATHYROIDECTOMY BIOPSY OF  LEFT INFERIOR PARATHYROID, AUTOTRANSPLANTATION OF LEFT INFERIOR PARATHYROID TO SCM;  Surgeon: Sandra Gemma, MD;  Location: [facility];  Service: General;  Laterality: N/A;   SAVORY DILATION N/A 09/15/2014   Procedure: SAVORY DILATION;  Surgeon: Sandra Binder, MD;  Location: [facility];  Service: Endoscopy;   Laterality: N/A;   WISDOM TOOTH EXTRACTION      Social History   Tobacco Use   Smoking status: Every Day    Packs/day: 0.50    Years: 25.00    Total pack years: 12.50    Types: Cigarettes   Smokeless tobacco: Never   Tobacco comments:    1/2 ppd per patient 04/30/2021 MR   Vaping Use   Vaping Use: Never used  Substance Use Topics   Alcohol use: No    Alcohol/week: 0.0 standard drinks of alcohol   Drug use: No    Family History  Problem Relation Age of Onset   Hypertension Father    Heart failure Mother    Stroke Mother    Hypertension Mother    Hyperlipidemia Mother    Colon cancer Neg Hx    Inflammatory bowel disease Neg Hx    Liver disease Neg Hx     Outpatient Encounter Medications as of 12/09/2021  Medication Sig   acetaminophen (TYLENOL) 500 MG tablet Take 500 mg by mouth every 6 (six) hours as needed for mild pain.   ADBRY 150 MG/ML SOSY Inject 150 mg into the skin every 14 (fourteen) days.   chlorpheniramine (CHLOR-TRIMETON) 4 MG tablet Take 4 mg by mouth at bedtime.   Cholecalciferol (VITAMIN D) 2000 UNITS CAPS Take 2,000 Units by mouth daily.  clobetasol ointment (TEMOVATE) 7.89 % Apply 1 application. topically 2 (two) times daily as needed (rash).   docusate sodium (COLACE) 100 MG capsule Take 100 mg by mouth daily.   fluticasone (FLONASE) 50 MCG/ACT nasal spray SHAKE LIQUID AND USE 2 SPRAYS IN EACH NOSTRIL DAILY   ibuprofen (ADVIL) 200 MG tablet Take 800 mg by mouth every 6 (six) hours as needed for mild pain or headache.   indapamide (LOZOL) 2.5 MG tablet TAKE 1 TABLET(2.5 MG) BY MOUTH DAILY (Patient taking differently: Take 2.5 mg by mouth daily.)   LORazepam (ATIVAN) 0.5 MG tablet 1 qhs prn   losartan (COZAAR) 100 MG tablet TAKE 1 TABLET BY MOUTH EVERY DAY (Patient taking differently: Take 100 mg by mouth daily.)   mometasone (ELOCON) 0.1 % cream Apply 1 application. topically 2 (two) times daily as needed for rash.   pantoprazole (PROTONIX) 40 MG  tablet Take 1 tablet (40 mg total) by mouth 2 (two) times daily before a meal.   rosuvastatin (CRESTOR) 40 MG tablet Take 1 tablet (40 mg total) by mouth daily.   traZODone (DESYREL) 100 MG tablet Take 1 tablet (100 mg total) by mouth at bedtime as needed for sleep.   varenicline (CHANTIX) 1 MG tablet Take 1 mg by mouth 2 (two) times daily.   [DISCONTINUED] albuterol (VENTOLIN HFA) 108 (90 Base) MCG/ACT inhaler Inhale 2 puffs into the lungs every 6 (six) hours as needed for wheezing or shortness of breath.   [DISCONTINUED] azelastine (ASTELIN) 0.1 % nasal spray Place 2 sprays into both nostrils 2 (two) times daily.   No facility-administered encounter medications on file as of 12/09/2021.    Allergies  Allergen Reactions   Sulfa Antibiotics Other (See Comments)    "Flu like symptoms 10 times worse".   Lisinopril Cough     HPI  Sandra Trujillo was first diagnosed with hypercalcemia in 2019.  After appropriate work-up to confirm hyperparathyroidism, she was sent for surgical intervention during her last visit.  She underwent parathyroidectomy on Jun 28, 2021.  Surgical results where right inferior and left superior gland was removed, showing adenoma.  Her calcium was normalized at 9 mg per DL after her surgery, however more recently slightly increased to 10.3 associated with normal PTH of 35.   Her repeat previsit labs show calcium of 10.5, PTH 28. She has no new complaints today. She has normal bone density.    No prior history of fragility fractures or falls. No history of  kidney stones. She has no new complaints today. No history of CKD.   she is not on HCTZ or other thiazide therapy.  She has history of vitamin D deficiency, currently on vitamin D supplement 2000 units daily.  No reported change. she is not on calcium supplements,  she eats dairy and green, leafy, vegetables on average amounts.  she does not have a family history of hypercalcemia, pituitary tumors, thyroid cancer, or  osteoporosis.   I reviewed her chart and she also has a history of hypertension, hyperlipidemia on treatment.    ROS: Dated as above. PE: BP 124/86   Pulse 88   Ht '5\' 4"'$  (1.626 m)   Wt 178 lb (80.7 kg)   BMI 30.55 kg/m , Body mass index is 30.55 kg/m. Wt Readings from Last 3 Encounters:  12/09/21 178 lb (80.7 kg)  11/15/21 179 lb 3.2 oz (81.3 kg)  10/08/21 182 lb 6.4 oz (82.7 kg)     Physical Exam- Limited  Healing surgical scar.  CMP ( most recent) CMP     Component Value Date/Time   NA 136 06/29/2021 0500   NA 141 01/07/2021 0827   NA 140 01/10/2013 1120   K 3.7 06/29/2021 0500   K 4.2 01/10/2013 1120   CL 102 06/29/2021 0500   CL 109 (H) 01/10/2013 1120   CO2 25 06/29/2021 0500   CO2 25 01/10/2013 1120   GLUCOSE 97 06/29/2021 0500   GLUCOSE 89 01/10/2013 1120   BUN 24 (H) 06/29/2021 0500   BUN 23 01/07/2021 0827   BUN 9 01/10/2013 1120   CREATININE 1.23 (H) 06/29/2021 0500   CREATININE 1.07 01/10/2013 1120   CREATININE 1.19 (H) 12/30/2012 0914   CALCIUM 10.5 (H) 12/05/2021 0830   CALCIUM 9.0 01/10/2013 1120   PROT 6.7 01/07/2021 0827   ALBUMIN 4.7 01/07/2021 0827   AST 16 01/07/2021 0827   ALT 21 01/07/2021 0827   ALKPHOS 92 01/07/2021 0827   BILITOT 0.5 01/07/2021 0827   GFRNONAA 51 (L) 06/29/2021 0500   GFRNONAA >60 01/10/2013 1120   GFRAA 56 (L) 07/20/2019 1024   GFRAA >60 01/10/2013 1120     Diabetic Labs (most recent): Lab Results  Component Value Date   HGBA1C (H) 06/02/2009    5.9 (NOTE)                                                                       According to the ADA Clinical Practice Recommendations for 2011, when HbA1c is used as a screening test:   >=6.5%   Diagnostic of Diabetes Mellitus           (if abnormal result  is confirmed)  5.7-6.4%   Increased risk of developing Diabetes Mellitus  References:Diagnosis and Classification of Diabetes Mellitus,Diabetes GEXB,2841,32(GMWNU 1):S62-S69 and Standards of Medical Care in          Diabetes - 2011,Diabetes Care,2011,34  (Suppl 1):S11-S61.     Lipid Panel ( most recent) Lipid Panel     Component Value Date/Time   CHOL 159 01/07/2021 0823   TRIG 87 01/07/2021 0823   HDL 50 01/07/2021 0823   CHOLHDL 3.2 01/07/2021 0823   CHOLHDL 3.7 12/30/2012 0914   VLDL 17 12/30/2012 0914   LDLCALC 93 01/07/2021 0823   LABVLDL 16 01/07/2021 0823     Recent Results (from the past 2160 hour(s))  PTH, intact and calcium     Status: Abnormal   Collection Time: 12/05/21  8:30 AM  Result Value Ref Range   Calcium 10.5 (H) 8.7 - 10.2 mg/dL   PTH 28 15 - 65 pg/mL   PTH Interp Comment     Comment: Interpretation                 Intact PTH    Calcium                                 (pg/mL)      (mg/dL) Normal                          15 - 65     8.6 - 10.2 Primary Hyperparathyroidism         >  33          >10.2 Secondary Hyperparathyroidism       >65          <10.2 Non-Parathyroid Hypercalcemia       <65          >10.2 Hypoparathyroidism                  <15          < 8.6 Non-Parathyroid Hypocalcemia    15 - 65          < 8.6   TSH     Status: None   Collection Time: 12/05/21  8:30 AM  Result Value Ref Range   TSH 1.470 0.450 - 4.500 uIU/mL  T4, free     Status: None   Collection Time: 12/05/21  8:30 AM  Result Value Ref Range   Free T4 1.36 0.82 - 1.77 ng/dL  PTH-related peptide     Status: None (Preliminary result)   Collection Time: 12/05/21  8:30 AM  Result Value Ref Range   PTH-related peptide WILL FOLLOW       Assessment: 1. Hypercalcemia / Hyperparathyroidism  Plan: She is status post parathyroidectomy with removal of right inferior and left superior gland's with benign outcomes.  Her postsurgical PTH is normal at 35, however calcium rebounds to 10.3.    Her most recent labs show calcium at 10.5, PTH of 28.  PTH RP is pending.  - No apparent complications from hypercalcemia/hyperparathyroidism: no history of  nephrolithiasis,  osteoporosis,fragility  fractures. No abdominal pain, no major mood disorders, no bone pain.  No recent suspicion of relapse of primary hyperparathyroidism.  She will need repeat studies including 24-hour urine calcium measurement, bone density, serum PTH/calcium, serum magnesium and phosphorus before her next visit in 6 months. She is advised to continue vitamin D3 2000 units daily.  She is advised to continue follow-up with her PMD Dr. Sallee Lange.   I spent 23 minutes in the care of the patient today including review of labs from Thyroid Function, CMP, and other relevant labs ; imaging/biopsy records (current and previous including abstractions from other facilities); face-to-face time discussing  her lab results and symptoms, medications doses, her options of short and long term treatment based on the latest standards of care / guidelines;   and documenting the encounter.  Victorino Sparrow Nestler  participated in the discussions, expressed understanding, and voiced agreement with the above plans.  All questions were answered to her satisfaction. she is encouraged to contact clinic should she have any questions or concerns prior to her return visit.     - Return in about 6 months (around 06/10/2022) for F/U with Pre-visit Labs, DXA Scan B4 NV.   Glade Lloyd, MD [facility] [address] [place], [place] [address] Phone: [phone]  Fax: [phone]    This note was partially dictated with voice recognition software. Similar sounding words can be transcribed inadequately or may not  be corrected upon review.  12/09/2021, 7:05 PM

## 2021-12-09 NOTE — Telephone Encounter (Signed)
Pt was seen today, she needs a DEXA before next visit

## 2021-12-10 NOTE — Telephone Encounter (Signed)
Order sent to [facility] for scheduling

## 2021-12-13 LAB — TSH: TSH: 1.47 u[IU]/mL (ref 0.450–4.500)

## 2021-12-13 LAB — PTH, INTACT AND CALCIUM
Calcium: 10.5 mg/dL — ABNORMAL HIGH (ref 8.7–10.2)
PTH: 28 pg/mL (ref 15–65)

## 2021-12-13 LAB — PTH-RELATED PEPTIDE: PTH-related peptide: <2 pmol/L

## 2021-12-13 LAB — T4, FREE: Free T4: 1.36 ng/dL (ref 0.82–1.77)

## 2021-12-21 DIAGNOSIS — M62830 Muscle spasm of back: Secondary | ICD-10-CM | POA: Diagnosis not present

## 2021-12-21 DIAGNOSIS — M5412 Radiculopathy, cervical region: Secondary | ICD-10-CM | POA: Diagnosis not present

## 2021-12-21 DIAGNOSIS — M9903 Segmental and somatic dysfunction of lumbar region: Secondary | ICD-10-CM | POA: Diagnosis not present

## 2021-12-21 DIAGNOSIS — M9901 Segmental and somatic dysfunction of cervical region: Secondary | ICD-10-CM | POA: Diagnosis not present

## 2021-12-21 DIAGNOSIS — M5136 Other intervertebral disc degeneration, lumbar region: Secondary | ICD-10-CM | POA: Diagnosis not present

## 2021-12-21 DIAGNOSIS — M9905 Segmental and somatic dysfunction of pelvic region: Secondary | ICD-10-CM | POA: Diagnosis not present

## 2021-12-21 DIAGNOSIS — M955 Acquired deformity of pelvis: Secondary | ICD-10-CM | POA: Diagnosis not present

## 2022-01-18 DIAGNOSIS — M955 Acquired deformity of pelvis: Secondary | ICD-10-CM | POA: Diagnosis not present

## 2022-01-18 DIAGNOSIS — M9905 Segmental and somatic dysfunction of pelvic region: Secondary | ICD-10-CM | POA: Diagnosis not present

## 2022-01-18 DIAGNOSIS — M62830 Muscle spasm of back: Secondary | ICD-10-CM | POA: Diagnosis not present

## 2022-01-18 DIAGNOSIS — M5412 Radiculopathy, cervical region: Secondary | ICD-10-CM | POA: Diagnosis not present

## 2022-01-18 DIAGNOSIS — M5136 Other intervertebral disc degeneration, lumbar region: Secondary | ICD-10-CM | POA: Diagnosis not present

## 2022-01-18 DIAGNOSIS — M9903 Segmental and somatic dysfunction of lumbar region: Secondary | ICD-10-CM | POA: Diagnosis not present

## 2022-01-18 DIAGNOSIS — M9901 Segmental and somatic dysfunction of cervical region: Secondary | ICD-10-CM | POA: Diagnosis not present

## 2022-01-22 DIAGNOSIS — Z79899 Other long term (current) drug therapy: Secondary | ICD-10-CM | POA: Diagnosis not present

## 2022-01-22 DIAGNOSIS — L2089 Other atopic dermatitis: Secondary | ICD-10-CM | POA: Diagnosis not present

## 2022-01-30 DIAGNOSIS — L818 Other specified disorders of pigmentation: Secondary | ICD-10-CM | POA: Diagnosis not present

## 2022-02-12 ENCOUNTER — Other Ambulatory Visit: Payer: Self-pay | Admitting: Family Medicine

## 2022-02-12 ENCOUNTER — Other Ambulatory Visit: Payer: Self-pay

## 2022-02-12 ENCOUNTER — Encounter: Payer: Self-pay | Admitting: Family Medicine

## 2022-02-12 ENCOUNTER — Ambulatory Visit (HOSPITAL_COMMUNITY)
Admission: RE | Admit: 2022-02-12 | Discharge: 2022-02-12 | Disposition: A | Discharge status: Home / Self Care | Payer: BC Managed Care – PPO | Source: Ambulatory Visit | Attending: Family Medicine | Admitting: Family Medicine

## 2022-02-12 ENCOUNTER — Ambulatory Visit (INDEPENDENT_AMBULATORY_CARE_PROVIDER_SITE_OTHER): Payer: BC Managed Care – PPO | Admitting: Family Medicine

## 2022-02-12 DIAGNOSIS — R300 Dysuria: Secondary | ICD-10-CM | POA: Diagnosis not present

## 2022-02-12 DIAGNOSIS — R1030 Lower abdominal pain, unspecified: Secondary | ICD-10-CM | POA: Insufficient documentation

## 2022-02-12 DIAGNOSIS — N200 Calculus of kidney: Secondary | ICD-10-CM | POA: Diagnosis not present

## 2022-02-12 DIAGNOSIS — J988 Other specified respiratory disorders: Secondary | ICD-10-CM | POA: Insufficient documentation

## 2022-02-12 LAB — POCT URINALYSIS DIP (CLINITEK)
Bilirubin, UA: NEGATIVE
Glucose, UA: NEGATIVE mg/dL
Ketones, POC UA: NEGATIVE mg/dL
Leukocytes, UA: NEGATIVE
Nitrite, UA: NEGATIVE
Spec Grav, UA: 1.015 (ref 1.010–1.025)
Urobilinogen, UA: 0.2 E.U./dL
pH, UA: 5.5 (ref 5.0–8.0)

## 2022-02-12 MED ORDER — OSELTAMIVIR PHOSPHATE 75 MG PO CAPS: 75.0000 mg | ORAL_CAPSULE | Freq: Two times a day (BID) | ORAL | 0 refills | Status: DC

## 2022-02-12 MED ORDER — FLUCONAZOLE 150 MG PO TABS: 150.0000 mg | ORAL_TABLET | Freq: Once | ORAL | 0 refills | Status: AC

## 2022-02-12 MED ORDER — FLUCONAZOLE 150 MG PO TABS
150.0000 mg | ORAL_TABLET | Freq: Once | ORAL | 0 refills | Status: AC
  Filled 2022-02-12: qty 1, 1d supply, fill #0

## 2022-02-12 NOTE — Assessment & Plan Note (Signed)
Patient experiencing urinary symptoms and lower abdominal pain.  Urinalysis notable for large blood.  Sending culture.  Given the fact that she is a smoker and the duration of her symptoms, I recommended CT imaging for further evaluation.

## 2022-02-12 NOTE — Patient Instructions (Signed)
Tamiflu while awaiting test results.  Lots of fluids.  We will call with CT results.  If you worsen, go directly to the ER.

## 2022-02-12 NOTE — Addendum Note (Signed)
Addended by: Anastasio Auerbach R on: 02/12/2022 09:13 AM   Modules accepted: Orders

## 2022-02-12 NOTE — Assessment & Plan Note (Signed)
Suspected influenza given abrupt onset of symptoms including fever.  Discussed empiric treatment while awaiting swab results.  Placing on Tamiflu.  Awaiting COVID, flu test results.

## 2022-02-12 NOTE — Progress Notes (Signed)
Subjective:  Patient ID: Sandra Trujillo, female    DOB: 1960-11-11  Age: 61 y.o. MRN: [id number]  CC: Chief Complaint  Patient presents with   Dysuria    Low abd pain , dysuria, vaginal irritation on left side frequency, urgency, fever    HPI:  61 year old female with hypertension, GERD, peptic ulcer disease, primary hyperparathyroidism, CKD, hyperlipidemia presents for evaluation of the above.  Patient reports that she has had urinary urgency and frequency and associated lower abdominal pain for the past 2 to 3 weeks.  She reports vaginal irritation and itching as well.  No known inciting factor.  No relieving factors.  Patient also reports that on Christmas Day she developed respiratory symptoms.  She has had cough, congestion, and fever as high as 101.  Patient Active Problem List   Diagnosis Date Noted   Dysuria 02/12/2022   Lower abdominal pain 02/12/2022   Respiratory infection 02/12/2022   Primary hyperparathyroidism ([place]) 06/28/2021   CKD (chronic kidney disease) stage 3, GFR 30-59 ml/min (HCC) 05/14/2021   Upper airway cough syndrome 04/30/2021   PUD (peptic ulcer disease) 03/19/2015   GERD (gastroesophageal reflux disease) 08/14/2014   Vitamin D deficiency 05/31/2014   Multiple thyroid nodules 05/22/2014   Essential hypertension, benign 12/16/2012   Hyperlipidemia 12/16/2012    Social Hx   Social History   Socioeconomic History   Marital status: Widowed    Spouse name: Not on file   Number of children: 1   Years of education: Not on file   Highest education level: Not on file  Occupational History   Occupation: [employer]  Tobacco Use   Smoking status: Every Day    Packs/day: 0.50    Years: 25.00    Total pack years: 12.50    Types: Cigarettes   Smokeless tobacco: Never   Tobacco comments:    1/2 ppd per patient 04/30/2021 MR   Vaping Use   Vaping Use: Never used  Substance and Sexual Activity   Alcohol use: No    Alcohol/week: 0.0 standard drinks of  alcohol   Drug use: No   Sexual activity: Not on file  Other Topics Concern   Not on file  Social History Narrative   Not on file   Social Determinants of Health   Financial Resource Strain: Not on file  Food Insecurity: Not on file  Transportation Needs: Not on file  Physical Activity: Not on file  Stress: Not on file  Social Connections: Not on file    Review of Systems Per HPI  Objective:  BP 126/80   Pulse 93   Temp (!) 97.3 F (36.3 C)   Ht '5\' 4"'$  (1.626 m)   Wt 178 lb (80.7 kg)   SpO2 99%   BMI 30.55 kg/m      02/12/2022    8:19 AM 12/09/2021    4:18 PM 11/15/2021    8:24 AM  BP/Weight  Systolic BP 299 242 683  Diastolic BP 80 86 84  Wt. (Lbs) 178 178 179.2  BMI 30.55 kg/m2 30.55 kg/m2 30.76 kg/m2    Physical Exam Vitals and nursing note reviewed.  Constitutional:      General: She is not in acute distress.    Appearance: Normal appearance.  HENT:     Head: Normocephalic and atraumatic.  Cardiovascular:     Rate and Rhythm: Normal rate and regular rhythm.  Pulmonary:     Effort: Pulmonary effort is normal.     Breath sounds: Normal  breath sounds. No wheezing, rhonchi or rales.  Abdominal:     General: There is no distension.     Palpations: Abdomen is soft.     Comments: No significant abdominal tenderness on exam.  Neurological:     Mental Status: She is alert.    Assessment & Plan:   Problem List Items Addressed This Visit       Respiratory   Respiratory infection    Suspected influenza given abrupt onset of symptoms including fever.  Discussed empiric treatment while awaiting swab results.  Placing on Tamiflu.  Awaiting COVID, flu test results.      Relevant Medications   oseltamivir (TAMIFLU) 75 MG capsule     Other   Dysuria   Relevant Orders   POCT URINALYSIS DIP (CLINITEK) (Completed)   Urine Culture   Lower abdominal pain    Patient experiencing urinary symptoms and lower abdominal pain.  Urinalysis notable for large  blood.  Sending culture.  Given the fact that she is a smoker and the duration of her symptoms, I recommended CT imaging for further evaluation.      Relevant Orders   CT Abdomen Pelvis Wo Contrast    Meds ordered this encounter  Medications   oseltamivir (TAMIFLU) 75 MG capsule    Sig: Take 1 capsule (75 mg total) by mouth 2 (two) times daily.    Dispense:  10 capsule    Refill:  0    Follow-up:  Return if symptoms worsen or fail to improve.  [place]

## 2022-02-12 NOTE — Addendum Note (Signed)
Addended by: Coral Spikes on: 02/12/2022 10:40 AM   Modules accepted: Orders

## 2022-02-14 ENCOUNTER — Other Ambulatory Visit: Payer: Self-pay | Admitting: Family Medicine

## 2022-02-14 ENCOUNTER — Ambulatory Visit: Payer: BC Managed Care – PPO | Admitting: Family Medicine

## 2022-02-14 LAB — COVID-19, FLU A+B AND RSV
Influenza A, NAA: DETECTED — AB
Influenza B, NAA: NOT DETECTED
RSV, NAA: NOT DETECTED
SARS-CoV-2, NAA: NOT DETECTED

## 2022-02-14 MED ORDER — CEPHALEXIN 500 MG PO CAPS: 500.0000 mg | ORAL_CAPSULE | Freq: Two times a day (BID) | ORAL | 0 refills | Status: DC

## 2022-02-16 LAB — URINE CULTURE

## 2022-02-18 ENCOUNTER — Ambulatory Visit: Payer: BC Managed Care – PPO | Admitting: Family Medicine

## 2022-02-24 ENCOUNTER — Encounter: Payer: Self-pay | Admitting: Family Medicine

## 2022-02-26 ENCOUNTER — Telehealth: Payer: Self-pay

## 2022-02-26 MED ORDER — CIPROFLOXACIN HCL 250 MG PO TABS: ORAL_TABLET | ORAL | 0 refills | Status: DC

## 2022-02-26 NOTE — Telephone Encounter (Signed)
Nurses I reviewed over the culture It is sensitive to other antibiotics as well  Recommend 250 mg Cipro 1 taken twice daily for 5 days If ongoing troubles despite this then I would recommend a follow-up office visit

## 2022-02-26 NOTE — Telephone Encounter (Signed)
Order sent to [facility] radiology for scheduling

## 2022-02-27 ENCOUNTER — Encounter: Payer: Self-pay | Admitting: "Endocrinology

## 2022-04-01 ENCOUNTER — Other Ambulatory Visit (HOSPITAL_COMMUNITY): Payer: BC Managed Care – PPO

## 2022-04-10 ENCOUNTER — Ambulatory Visit (HOSPITAL_COMMUNITY)
Admission: RE | Admit: 2022-04-10 | Discharge: 2022-04-10 | Disposition: A | Discharge status: Home / Self Care | Payer: BC Managed Care – PPO | Source: Ambulatory Visit | Attending: "Endocrinology | Admitting: "Endocrinology

## 2022-04-10 DIAGNOSIS — E210 Primary hyperparathyroidism: Secondary | ICD-10-CM | POA: Insufficient documentation

## 2022-04-10 DIAGNOSIS — Z780 Asymptomatic menopausal state: Secondary | ICD-10-CM | POA: Diagnosis not present

## 2022-05-01 DIAGNOSIS — E210 Primary hyperparathyroidism: Secondary | ICD-10-CM | POA: Diagnosis not present

## 2022-05-01 DIAGNOSIS — L818 Other specified disorders of pigmentation: Secondary | ICD-10-CM | POA: Diagnosis not present

## 2022-05-02 LAB — PTH, INTACT AND CALCIUM
Calcium: 10.2 mg/dL (ref 8.7–10.2)
PTH: 25 pg/mL (ref 15–65)

## 2022-05-02 LAB — MAGNESIUM: Magnesium: 2.2 mg/dL (ref 1.6–2.3)

## 2022-05-02 LAB — PHOSPHORUS: Phosphorus: 3.8 mg/dL (ref 3.0–4.3)

## 2022-05-04 ENCOUNTER — Other Ambulatory Visit: Payer: Self-pay | Admitting: Family Medicine

## 2022-05-04 DIAGNOSIS — E785 Hyperlipidemia, unspecified: Secondary | ICD-10-CM

## 2022-05-04 DIAGNOSIS — I10 Essential (primary) hypertension: Secondary | ICD-10-CM

## 2022-05-12 DIAGNOSIS — E210 Primary hyperparathyroidism: Secondary | ICD-10-CM | POA: Diagnosis not present

## 2022-05-14 DIAGNOSIS — E210 Primary hyperparathyroidism: Secondary | ICD-10-CM | POA: Diagnosis not present

## 2022-05-15 LAB — CALCIUM, URINE, 24 HOUR
Calcium, 24H Urine: 32 mg/24 hr (ref 0–320)
Calcium, Urine: 3.5 mg/dL

## 2022-05-16 LAB — CREATININE, URINE, 24 HOUR
Creatinine, 24H Ur: 1679 mg/24 hr (ref 800–1800)
Creatinine, Urine: 186.6 mg/dL

## 2022-06-10 ENCOUNTER — Ambulatory Visit: Payer: BC Managed Care – PPO | Admitting: "Endocrinology

## 2022-06-11 ENCOUNTER — Other Ambulatory Visit: Payer: Self-pay | Admitting: Family Medicine

## 2022-07-03 ENCOUNTER — Other Ambulatory Visit: Payer: Self-pay | Admitting: Family Medicine

## 2022-09-02 ENCOUNTER — Other Ambulatory Visit: Payer: Self-pay | Admitting: Family Medicine

## 2022-10-27 DIAGNOSIS — L2089 Other atopic dermatitis: Secondary | ICD-10-CM | POA: Diagnosis not present

## 2022-10-27 DIAGNOSIS — Z79899 Other long term (current) drug therapy: Secondary | ICD-10-CM | POA: Diagnosis not present

## 2022-10-27 DIAGNOSIS — L818 Other specified disorders of pigmentation: Secondary | ICD-10-CM | POA: Diagnosis not present

## 2022-12-02 DIAGNOSIS — Z1331 Encounter for screening for depression: Secondary | ICD-10-CM | POA: Diagnosis not present

## 2022-12-02 DIAGNOSIS — Z30432 Encounter for removal of intrauterine contraceptive device: Secondary | ICD-10-CM | POA: Diagnosis not present

## 2022-12-02 DIAGNOSIS — Z1231 Encounter for screening mammogram for malignant neoplasm of breast: Secondary | ICD-10-CM | POA: Diagnosis not present

## 2022-12-02 DIAGNOSIS — Z01419 Encounter for gynecological examination (general) (routine) without abnormal findings: Secondary | ICD-10-CM | POA: Diagnosis not present

## 2022-12-02 LAB — HM MAMMOGRAPHY

## 2023-02-04 ENCOUNTER — Other Ambulatory Visit: Payer: Self-pay | Admitting: Family Medicine

## 2023-02-16 ENCOUNTER — Ambulatory Visit (INDEPENDENT_AMBULATORY_CARE_PROVIDER_SITE_OTHER): Payer: BC Managed Care – PPO | Admitting: Family Medicine

## 2023-02-16 VITALS — BP 132/80 | HR 110 | Temp 98.1°F | Ht 64.0 in | Wt 178.6 lb

## 2023-02-16 DIAGNOSIS — Z0000 Encounter for general adult medical examination without abnormal findings: Secondary | ICD-10-CM

## 2023-02-16 DIAGNOSIS — Z23 Encounter for immunization: Secondary | ICD-10-CM | POA: Diagnosis not present

## 2023-02-16 DIAGNOSIS — E785 Hyperlipidemia, unspecified: Secondary | ICD-10-CM

## 2023-02-16 DIAGNOSIS — N1831 Chronic kidney disease, stage 3a: Secondary | ICD-10-CM

## 2023-02-16 DIAGNOSIS — Z79899 Other long term (current) drug therapy: Secondary | ICD-10-CM

## 2023-02-16 DIAGNOSIS — Z0001 Encounter for general adult medical examination with abnormal findings: Secondary | ICD-10-CM

## 2023-02-16 DIAGNOSIS — R739 Hyperglycemia, unspecified: Secondary | ICD-10-CM

## 2023-02-16 DIAGNOSIS — I10 Essential (primary) hypertension: Secondary | ICD-10-CM | POA: Diagnosis not present

## 2023-02-16 DIAGNOSIS — Z1211 Encounter for screening for malignant neoplasm of colon: Secondary | ICD-10-CM

## 2023-02-16 MED ORDER — INDAPAMIDE 2.5 MG PO TABS: ORAL_TABLET | ORAL | 1 refills | Status: DC

## 2023-02-16 MED ORDER — TRAZODONE HCL 100 MG PO TABS: 100.0000 mg | ORAL_TABLET | Freq: Every evening | ORAL | 1 refills | Status: DC | PRN

## 2023-02-16 MED ORDER — PANTOPRAZOLE SODIUM 40 MG PO TBEC: 40.0000 mg | DELAYED_RELEASE_TABLET | Freq: Two times a day (BID) | ORAL | 3 refills | Status: DC

## 2023-02-16 MED ORDER — AMOXICILLIN-POT CLAVULANATE 875-125 MG PO TABS: 1.0000 | ORAL_TABLET | Freq: Two times a day (BID) | ORAL | 0 refills | Status: DC

## 2023-02-16 MED ORDER — LOSARTAN POTASSIUM 100 MG PO TABS: ORAL_TABLET | ORAL | 3 refills | Status: AC

## 2023-02-16 MED ORDER — ROSUVASTATIN CALCIUM 40 MG PO TABS: 40.0000 mg | ORAL_TABLET | Freq: Every day | ORAL | 3 refills | Status: DC

## 2023-02-16 NOTE — Progress Notes (Signed)
   Subjective:    Patient ID: Sandra Trujillo, female    DOB: 08-10-1960, 62 y.o.   MRN: [id number]  HPI The patient comes in today for a wellness visit.    A review of their health history was completed.  A review of medications was also completed.  Any needed refills; yes  Eating habits: Overall good eating habits  Falls/  MVA accidents in past few months: No falls or injuries recently  Regular exercise: Fit send some walking  Specialist pt sees on regular basis: Sees GYN on a regular basis up-to-date on Pap smear mammogram  Preventative health issues were discussed.   Additional concerns: Patient is interested in seeing her A1c because her glucose has been elevated in the past  Patient does smoke she has been encouraged strongly to quit smoking she states that she will work on with quitting on her own without medications.    Review of Systems     Objective:   Physical Exam  General-in no acute distress Eyes-no discharge Lungs-respiratory rate normal, CTA CV-no murmurs,RRR Extremities skin warm dry no edema Neuro grossly normal Behavior normal, alert Patient defers on GYN breast exam      Assessment & Plan:   1. Well adult exam Adult wellness-complete.wellness physical was conducted today. Importance of diet and exercise were discussed in detail.  Importance of stress reduction and healthy living were discussed.  In addition to this a discussion regarding safety was also covered.  We also reviewed over immunizations and gave recommendations regarding current immunization needed for age.   In addition to this additional areas were also touched on including: Preventative health exams needed:  Colonoscopy 2026  Patient was advised yearly wellness exam   2. Essential hypertension, benign Blood pressure decent control continue current measures check lab work - rosuvastatin (CRESTOR) 40 MG tablet; Take 1 tablet (40 mg total) by mouth daily.  Dispense: 90  tablet; Refill: 3 - Basic Metabolic Panel - Microalbumin/Creatinine Ratio, Urine  3. Hyperlipidemia, unspecified hyperlipidemia type Keep cholesterol under good control continue medication check lab work - rosuvastatin (CRESTOR) 40 MG tablet; Take 1 tablet (40 mg total) by mouth daily.  Dispense: 90 tablet; Refill: 3 - Lipid Panel  4. Hyperglycemia (Primary) History of hyperglycemia check A1c Patient requested A1c inadvertently I do not see this ordered 5. Stage 3a chronic kidney disease (HCC) History of chronic kidney disease check lab work await results  6. Screening for colon cancer Screening for blood in stool - IFOBT POC (occult bld, rslt in office); Future  7. Immunization due Pneumococcal today - Pneumococcal conjugate vaccine 20-valent (Prevnar 20)  8. High risk medication use Liver function ordered - Hepatic Function Panel

## 2023-02-17 ENCOUNTER — Encounter: Payer: Self-pay | Admitting: Family Medicine

## 2023-02-17 ENCOUNTER — Other Ambulatory Visit: Payer: Self-pay

## 2023-02-17 DIAGNOSIS — Z122 Encounter for screening for malignant neoplasm of respiratory organs: Secondary | ICD-10-CM

## 2023-02-17 LAB — BASIC METABOLIC PANEL
BUN/Creatinine Ratio: 14 (ref 12–28)
BUN: 18 mg/dL (ref 8–27)
CO2: 24 mmol/L (ref 20–29)
Calcium: 10.4 mg/dL — ABNORMAL HIGH (ref 8.7–10.3)
Chloride: 102 mmol/L (ref 96–106)
Creatinine, Ser: 1.32 mg/dL — ABNORMAL HIGH (ref 0.57–1.00)
Glucose: 96 mg/dL (ref 70–99)
Potassium: 4.1 mmol/L (ref 3.5–5.2)
Sodium: 142 mmol/L (ref 134–144)
eGFR: 46 mL/min/{1.73_m2} — ABNORMAL LOW (ref >59)

## 2023-02-17 LAB — LIPID PANEL
Chol/HDL Ratio: 4.5 {ratio} — ABNORMAL HIGH (ref 0.0–4.4)
Cholesterol, Total: 180 mg/dL (ref 100–199)
HDL: 40 mg/dL (ref >39)
LDL Chol Calc (NIH): 112 mg/dL — ABNORMAL HIGH (ref 0–99)
Triglycerides: 158 mg/dL — ABNORMAL HIGH (ref 0–149)
VLDL Cholesterol Cal: 28 mg/dL (ref 5–40)

## 2023-02-17 LAB — HEPATIC FUNCTION PANEL
ALT: 23 [IU]/L (ref 0–32)
AST: 22 [IU]/L (ref 0–40)
Albumin: 4.4 g/dL (ref 3.8–4.9)
Alkaline Phosphatase: 112 [IU]/L (ref 44–121)
Bilirubin Total: 0.3 mg/dL (ref 0.0–1.2)
Bilirubin, Direct: 0.1 mg/dL (ref 0.00–0.40)
Total Protein: 6.9 g/dL (ref 6.0–8.5)

## 2023-02-17 LAB — MICROALBUMIN / CREATININE URINE RATIO
Creatinine, Urine: 212.1 mg/dL
Microalb/Creat Ratio: 11 mg/g{creat} (ref 0–29)
Microalbumin, Urine: 23.9 ug/mL

## 2023-02-20 ENCOUNTER — Other Ambulatory Visit: Payer: Self-pay

## 2023-02-20 DIAGNOSIS — R739 Hyperglycemia, unspecified: Secondary | ICD-10-CM

## 2023-02-20 DIAGNOSIS — N1831 Chronic kidney disease, stage 3a: Secondary | ICD-10-CM

## 2023-03-10 ENCOUNTER — Other Ambulatory Visit: Payer: Self-pay | Admitting: Family Medicine

## 2023-03-10 IMAGING — DX DG CLAVICLE*L*
2 series · 2 of 2 positions shown · non-contrast
Comparison: Chest x-rays, no prior dedicated clavicular films.

CLINICAL DATA: Proximal clavicle with swelling for 2 weeks. No
history of pain.

EXAM:
LEFT CLAVICLE - 2+ VIEWS

[clavicle ap]
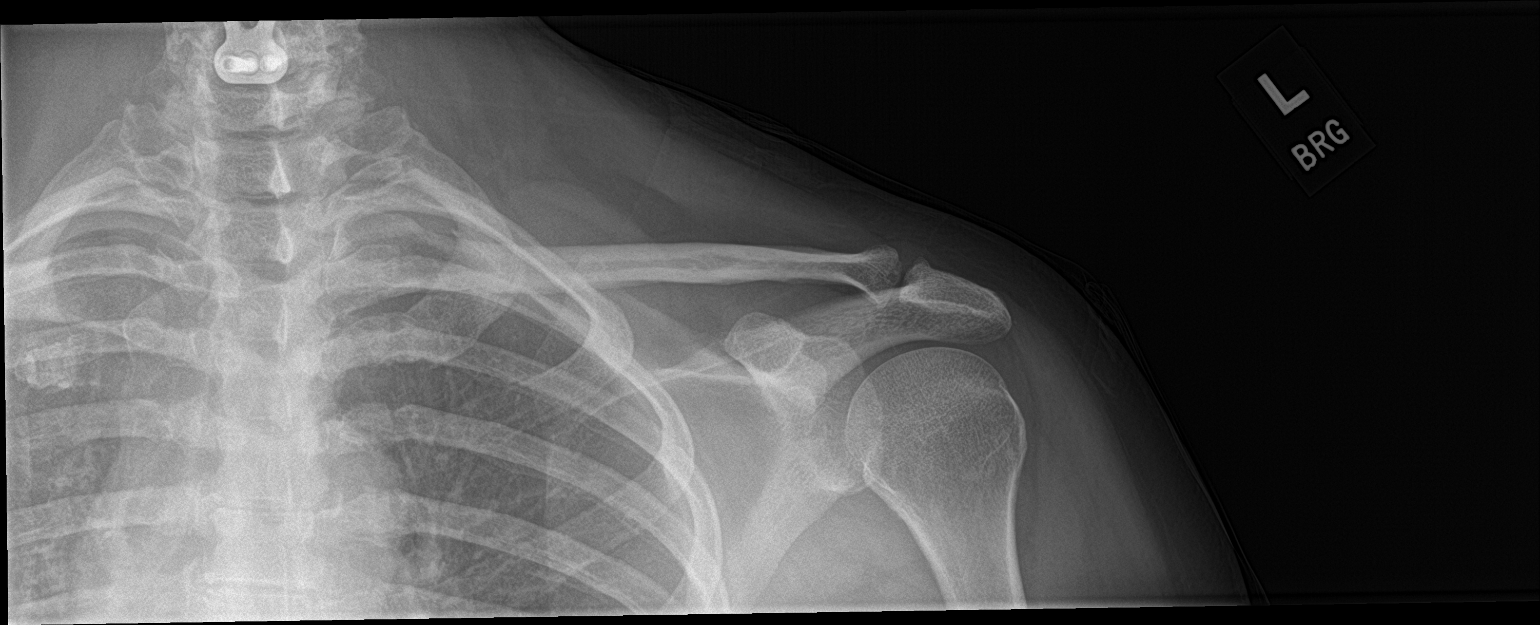

[clavicle axial]
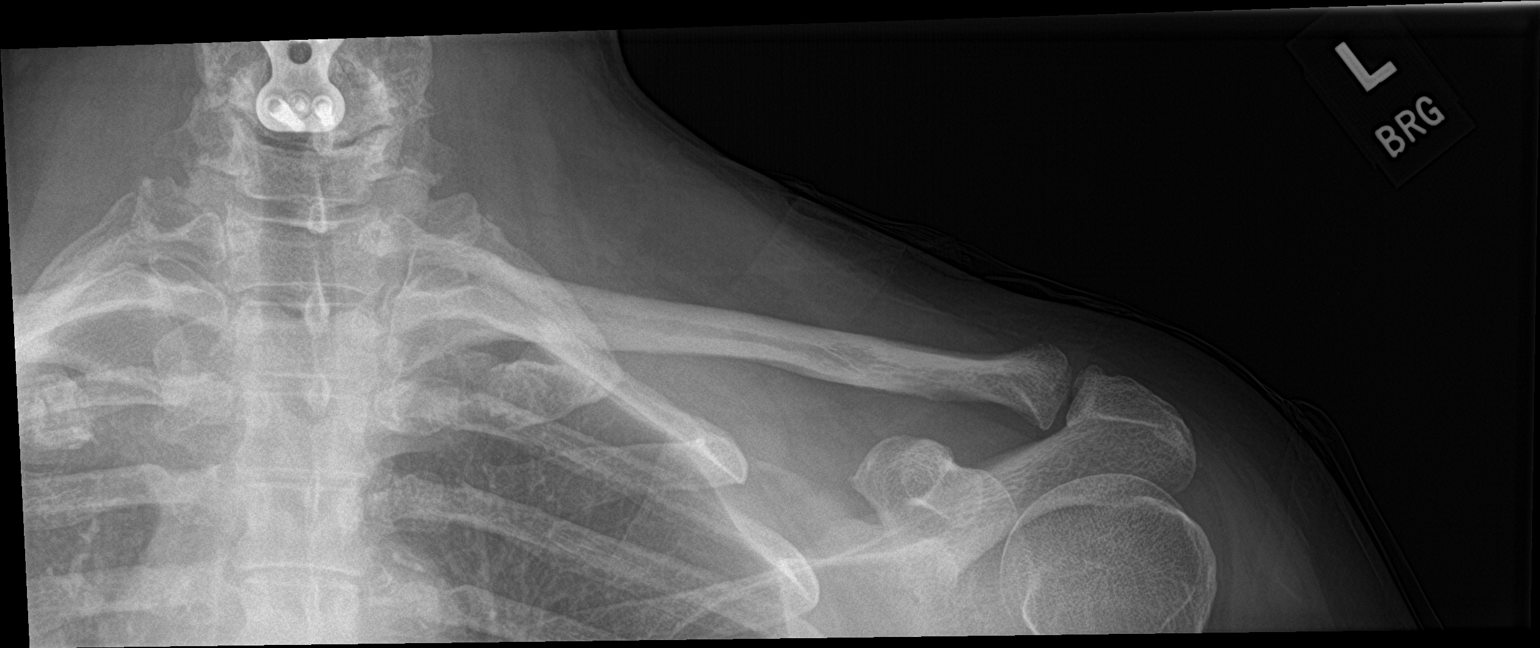

[2 of 2 positions shown; findings below may reference images not displayed]

FINDINGS: No signs of acute clavicular abnormality. Mild acromioclavicular
degenerative changes.
IMPRESSION: No evidence of acute osseous abnormality. Negative evaluation of the
LEFT clavicle.

Mild acromioclavicular degenerative changes.

## 2023-03-10 IMAGING — DX DG CHEST 2V
2 series · 2 of 2 positions shown · non-contrast
Comparison: Clavicle evaluation of the same date. Prior chest x-ray
from 5611.

CLINICAL DATA: 59-year-old female with swelling over the medial
clavicle.

EXAM:
CHEST - 2 VIEW

[chest pa]
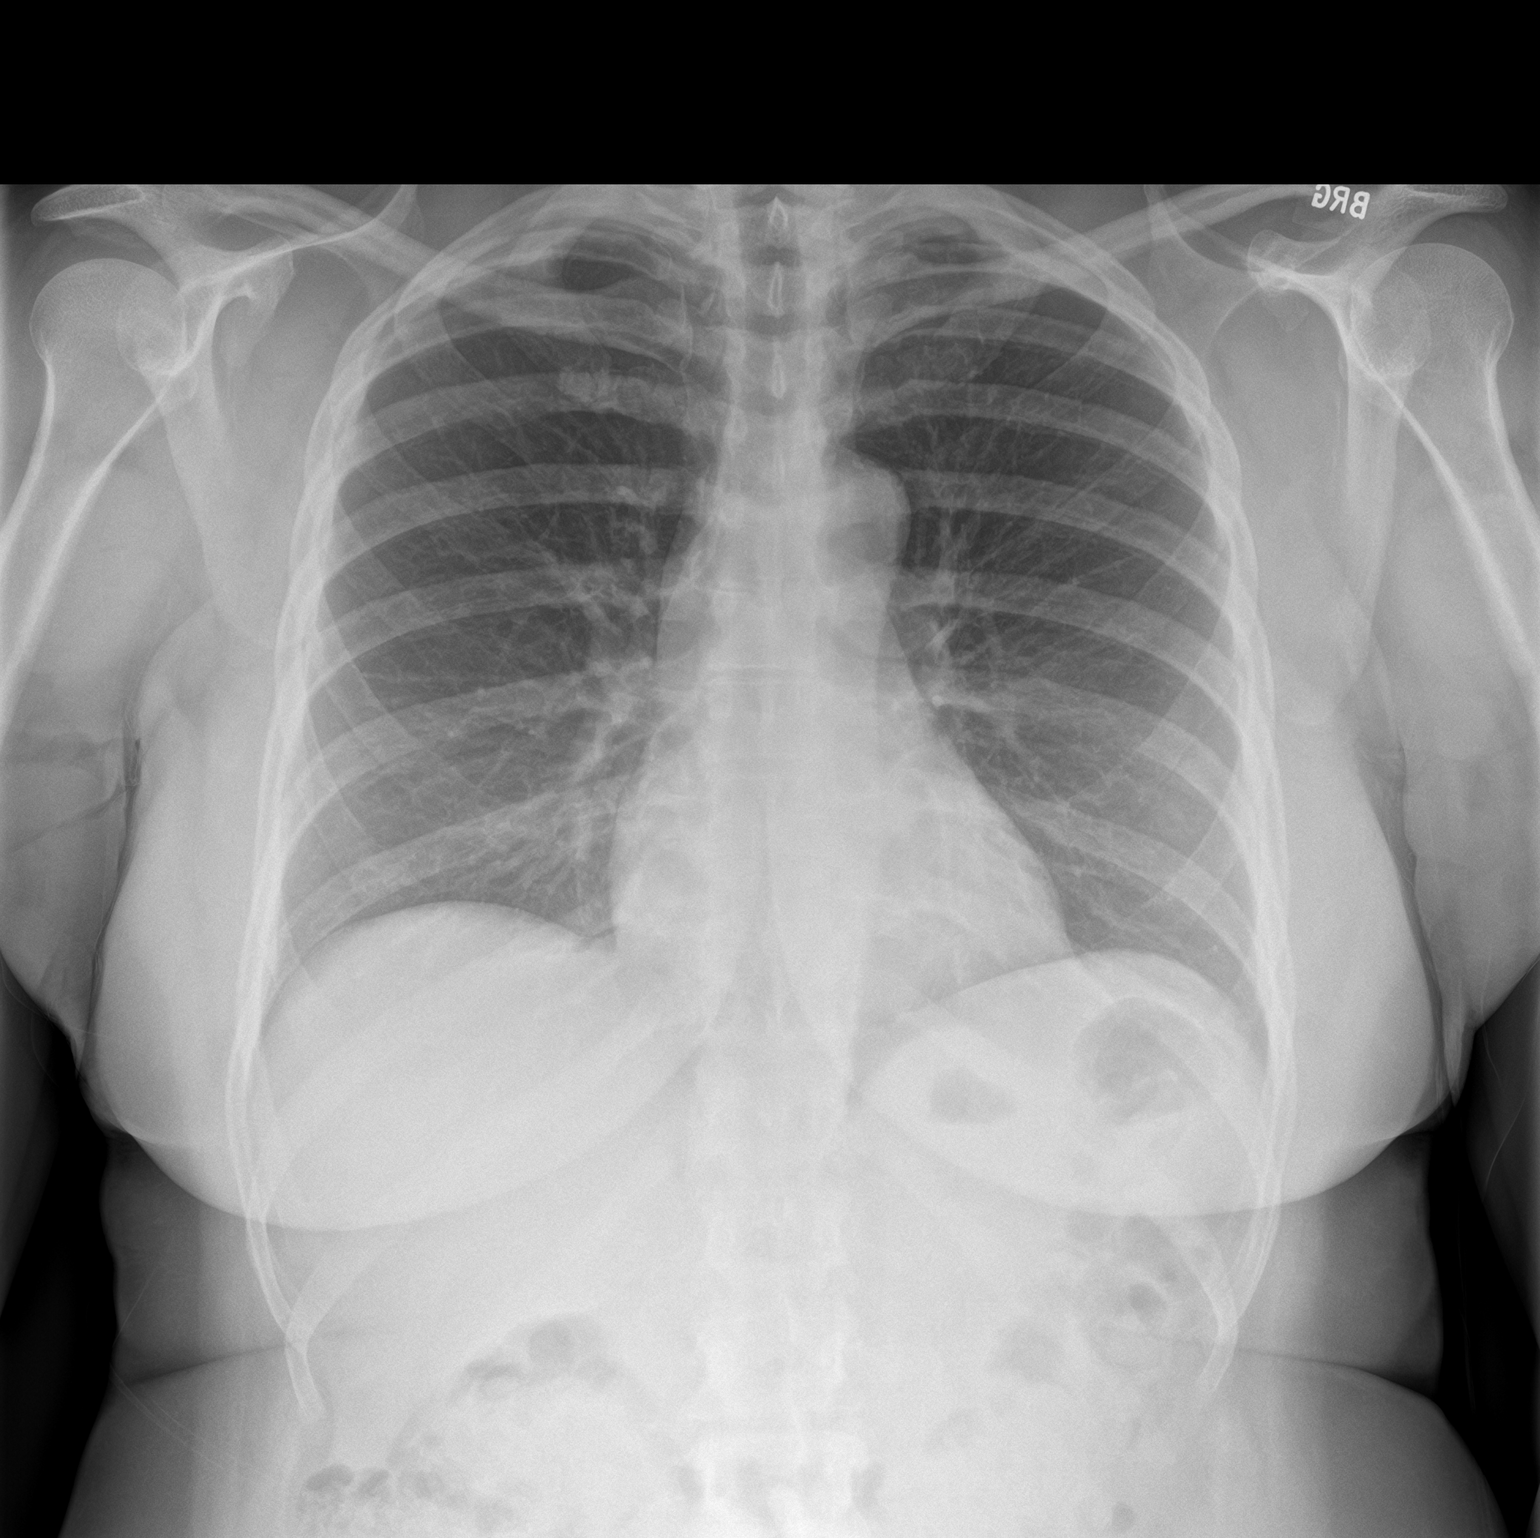

[chest lat]
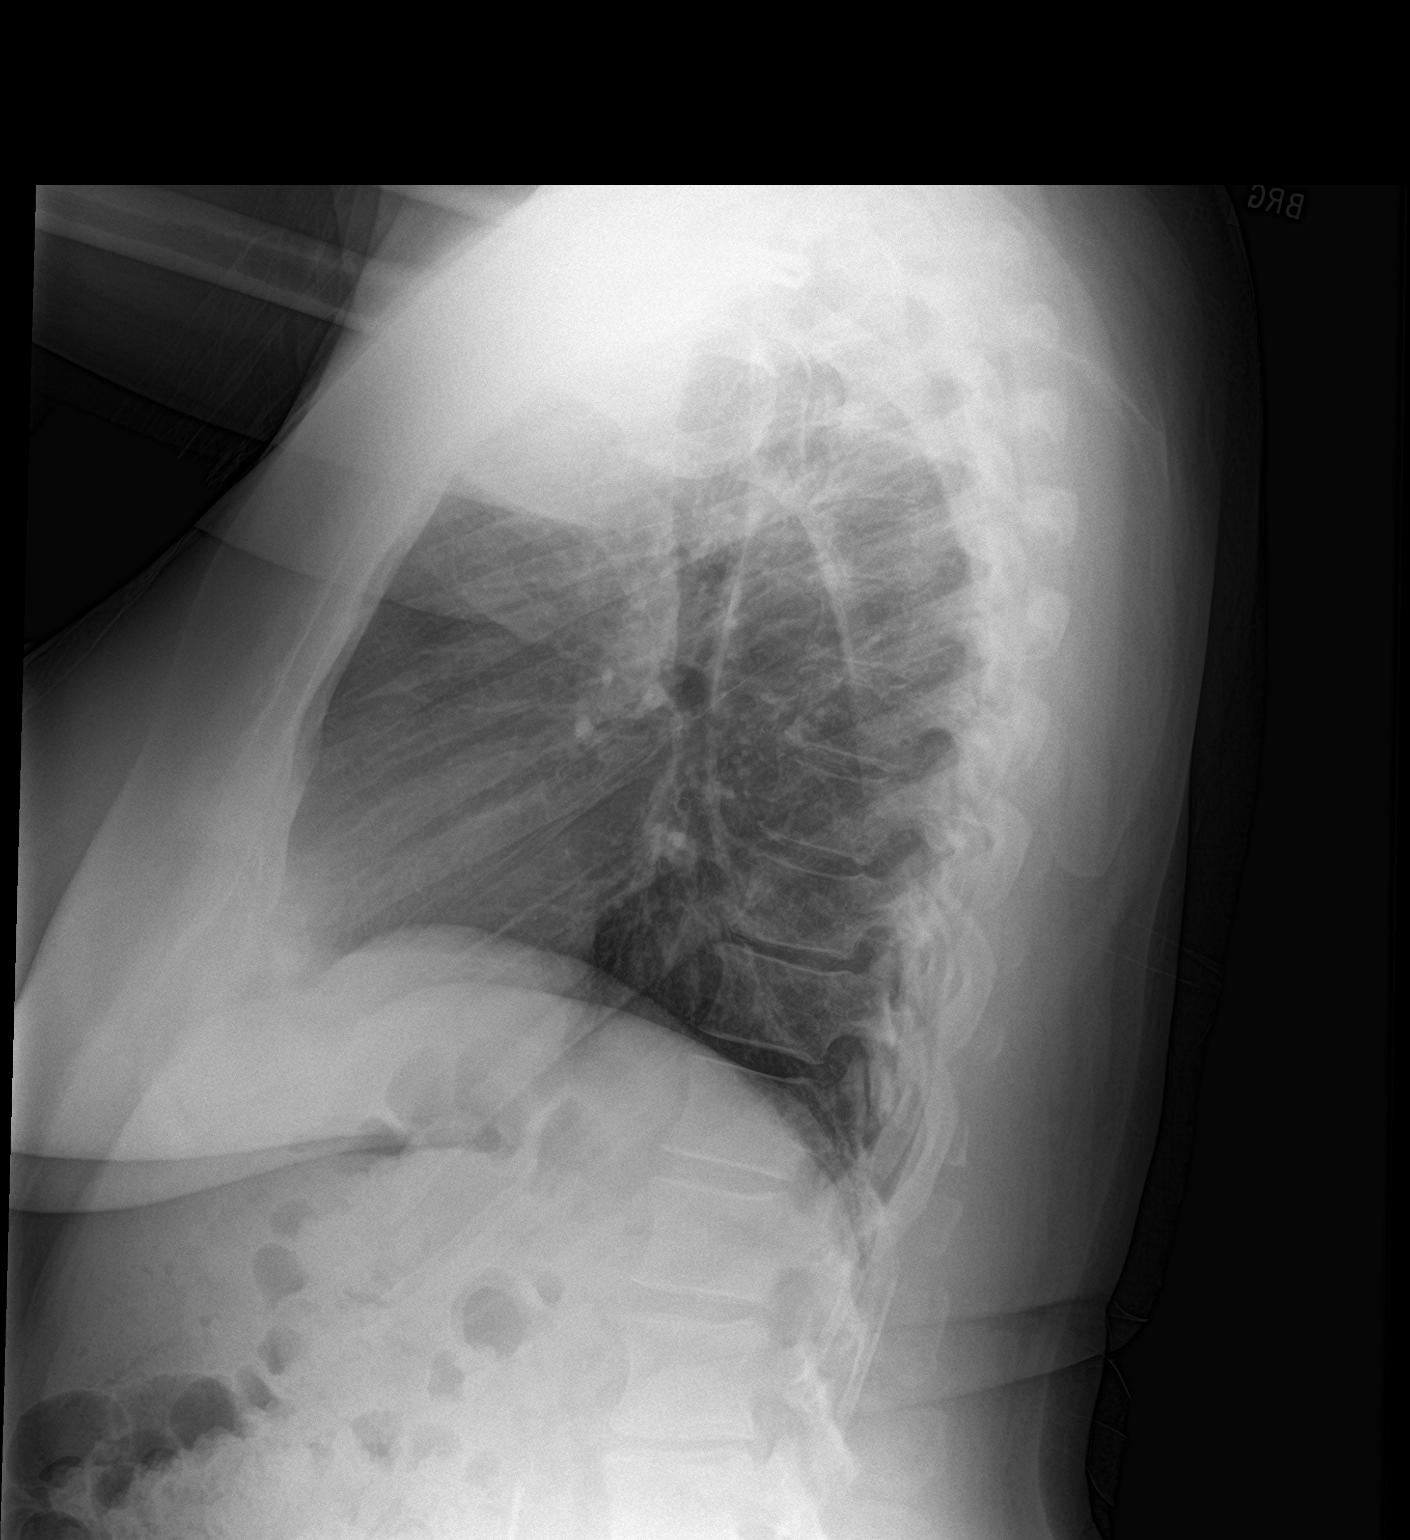

[2 of 2 positions shown; findings below may reference images not displayed]

FINDINGS: Trachea is midline. Cardiomediastinal contours and hilar structures
are normal.

Lungs are clear.  No effusion.  No pneumothorax.

On limited assessment no acute skeletal process. Specifically no
abnormality overlying or involving the clavicle by radiograph.
IMPRESSION: No acute cardiopulmonary disease.

## 2023-03-12 DIAGNOSIS — I10 Essential (primary) hypertension: Secondary | ICD-10-CM | POA: Diagnosis not present

## 2023-03-12 DIAGNOSIS — E559 Vitamin D deficiency, unspecified: Secondary | ICD-10-CM | POA: Diagnosis not present

## 2023-03-12 DIAGNOSIS — E210 Primary hyperparathyroidism: Secondary | ICD-10-CM | POA: Diagnosis not present

## 2023-03-12 DIAGNOSIS — N1831 Chronic kidney disease, stage 3a: Secondary | ICD-10-CM | POA: Diagnosis not present

## 2023-03-20 ENCOUNTER — Encounter: Payer: Self-pay | Admitting: Family Medicine

## 2023-03-20 ENCOUNTER — Telehealth: Payer: Self-pay

## 2023-03-20 ENCOUNTER — Other Ambulatory Visit (HOSPITAL_COMMUNITY): Payer: Self-pay | Admitting: Nephrology

## 2023-03-20 ENCOUNTER — Other Ambulatory Visit: Payer: Self-pay

## 2023-03-20 DIAGNOSIS — Z1211 Encounter for screening for malignant neoplasm of colon: Secondary | ICD-10-CM

## 2023-03-20 DIAGNOSIS — N1831 Chronic kidney disease, stage 3a: Secondary | ICD-10-CM

## 2023-03-20 LAB — IFOBT (OCCULT BLOOD): IFOBT: POSITIVE

## 2023-03-20 NOTE — Telephone Encounter (Signed)
Stool sample come in the mail placed in the lab

## 2023-03-30 ENCOUNTER — Other Ambulatory Visit: Payer: Self-pay

## 2023-03-30 DIAGNOSIS — R195 Other fecal abnormalities: Secondary | ICD-10-CM

## 2023-04-01 ENCOUNTER — Ambulatory Visit
Admission: RE | Admit: 2023-04-01 | Discharge: 2023-04-01 | Disposition: A | Discharge status: Home / Self Care | Payer: BC Managed Care – PPO | Source: Ambulatory Visit | Attending: Nephrology | Admitting: Nephrology

## 2023-04-01 ENCOUNTER — Encounter (INDEPENDENT_AMBULATORY_CARE_PROVIDER_SITE_OTHER): Payer: Self-pay | Admitting: *Deleted

## 2023-04-01 DIAGNOSIS — N1831 Chronic kidney disease, stage 3a: Secondary | ICD-10-CM | POA: Insufficient documentation

## 2023-04-01 DIAGNOSIS — N189 Chronic kidney disease, unspecified: Secondary | ICD-10-CM | POA: Diagnosis not present

## 2023-04-14 NOTE — Progress Notes (Unsigned)
 Referring Provider: Babs Sciara, MD Primary Care Physician:  Babs Sciara, MD Primary Gastroenterologist:  Dr. Marletta Lor  Chief Complaint  Patient presents with   Positive Fecal Occult Test    States that she does have trouble having a bm at times. Not aware of any issues with hemorrhoids.    HPI:   Sandra Trujillo is a 63 y.o. female with history of HTN, HLD, hyperparathyroidism, CKD, GERD, Schatzki's ring, PUD in the setting of NSAIDs, presenting today at the request of Luking, Jonna Coup, MD for fecal occult blood positive.  Reviewed labs.  iFOBT was + 03/20/2023.  We have not seen patient since 2017.   Today: No brbpr or melena.  No unintentional weight loss.  Occasional constipation, but this is well-controlled with Colace 100 mg daily.   Intermittent mid low abdominal pain for the last few months. More so present when waking up in the morning. Dull. Reports she is retaining some urine. Recent renal US with 15% post void residual.  Denies dysuria, hematuria.  Not related to bowel movements.   GERD under control for the most part on pantoprazole 40 mg BID.  Has been off of pantoprazole as she ran out, but resumed in January.  When off of pantoprazole, she had a lot of symptoms.  No dysphagia. Sometimes things go down the wrong way.     Last EGD 04/02/2015: Mild erosive gastritis, probable proximal esophageal stricture dilated with savory dilation from 14 to 17 mm.  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  Mild diverticulosis in the sigmoid and descending colon.  Redundant left colon.  Recommended repeat colonoscopy in 10 years.  Past Medical History:  Diagnosis Date   Anemia    CKD (chronic kidney disease)    GERD (gastroesophageal reflux disease)    Hyperlipidemia    Hyperparathyroidism (HCC)    Hypertension     Past Surgical History:  Procedure Laterality Date   CERVICAL DISCECTOMY     COLONOSCOPY N/A  09/15/2014   SLF: 1. submucosal lesion in the hepatic flexure likely a lipoma 2 . mild diverticulosis in teh sigmoid colon and descending colon 3. the left colon is redundant   COLONOSCOPY WITH ESOPHAGOGASTRODUODENOSCOPY (EGD)  12/19/2002   [id number] HH/mildchanges of reflux/otherwise normal. normal colonoscopy and terminal ileoscopy.   ESOPHAGEAL DILATION N/A 04/02/2015   Procedure: ESOPHAGEAL DILATION;  Surgeon: West Bali, MD;  [facility];  Service: Endoscopy;  Laterality: N/A;   ESOPHAGOGASTRODUODENOSCOPY N/A 09/15/2014   SLF: 1. Schatzki ring at the gastroesophagel junction 2. moderate erosive gastritis/duoentitit and three small gastric ulcers due to ASA/ibuprofen   ESOPHAGOGASTRODUODENOSCOPY N/A 04/02/2015   Procedure: ESOPHAGOGASTRODUODENOSCOPY (EGD);  Surgeon: West Bali, MD;  [facility];  Service: Endoscopy;  Laterality: N/A;  1315 - moved to 2/13 @ 2:00   PARATHYROIDECTOMY N/A 06/28/2021   Procedure: NECK EXPLORATION WITH  LEFT SUPERIOR AND RIGHT INFERIOR PARATHYROIDECTOMY BIOPSY OF  LEFT INFERIOR PARATHYROID, AUTOTRANSPLANTATION OF LEFT INFERIOR PARATHYROID TO SCM;  Surgeon: Darnell Level, MD;  Location: [facility];  Service: General;  Laterality: N/A;   SAVORY DILATION N/A 09/15/2014   Procedure: SAVORY DILATION;  Surgeon: West Bali, MD;  [facility];  Service: Endoscopy;  Laterality: N/A;   WISDOM TOOTH EXTRACTION      Current Outpatient Medications  Medication Sig Dispense Refill   acetaminophen (TYLENOL) 500 MG tablet Take 500 mg by mouth every 6 (six)  hours as needed for mild pain.     ADBRY 150 MG/ML SOSY Inject 150 mg into the skin every 14 (fourteen) days.     chlorpheniramine (CHLOR-TRIMETON) 4 MG tablet Take 4 mg by mouth at bedtime.     Cholecalciferol (VITAMIN D) 2000 UNITS CAPS Take 2,000 Units by mouth daily.     clobetasol ointment (TEMOVATE) 0.05 % Apply 1 application. topically 2 (two) times daily as needed (rash).      docusate sodium (COLACE) 100 MG capsule Take 100 mg by mouth daily.     fluticasone (FLONASE) 50 MCG/ACT nasal spray SHAKE LIQUID AND USE 2 SPRAYS IN EACH NOSTRIL DAILY 16 g 1   indapamide (LOZOL) 2.5 MG tablet TAKE 1 TABLET(2.5 MG) BY MOUTH DAILY 90 tablet 1   losartan (COZAAR) 100 MG tablet TAKE 1 TABLET BY MOUTH EVERY DAY 90 tablet 3   mometasone (ELOCON) 0.1 % cream Apply 1 application. topically 2 (two) times daily as needed for rash.     pantoprazole (PROTONIX) 40 MG tablet Take 1 tablet (40 mg total) by mouth 2 (two) times daily before a meal. 180 tablet 3   rosuvastatin (CRESTOR) 40 MG tablet Take 1 tablet (40 mg total) by mouth daily. 90 tablet 3   traZODone (DESYREL) 100 MG tablet Take 1 tablet (100 mg total) by mouth at bedtime as needed for sleep. 90 tablet 1   No current facility-administered medications for this visit.    Allergies as of 04/16/2023 - Review Complete 04/16/2023  Allergen Reaction Noted   Sulfa antibiotics Other (See Comments) 12/16/2012   Lisinopril Cough 05/22/2014    Family History  Problem Relation Age of Onset   Hypertension Father    Heart failure Mother    Stroke Mother    Hypertension Mother    Hyperlipidemia Mother    Colon cancer Neg Hx    Inflammatory bowel disease Neg Hx    Liver disease Neg Hx     Social History   Socioeconomic History   Marital status: Widowed    Spouse name: Not on file   Number of children: 1   Years of education: Not on file   Highest education level: Bachelor's degree (e.g., BA, AB, BS)  Occupational History   Occupation: [employer]  Tobacco Use   Smoking status: Every Day    Current packs/day: 0.50    Average packs/day: 0.5 packs/day for 25.0 years (12.5 ttl pk-yrs)    Types: Cigarettes   Smokeless tobacco: Never  Vaping Use   Vaping status: Never Used  Substance and Sexual Activity   Alcohol use: No    Alcohol/week: 0.0 standard drinks of alcohol   Drug use: No   Sexual activity: Yes  Other Topics Concern    Not on file  Social History Narrative   Not on file   Social Drivers of Health   Financial Resource Strain: Low Risk  (02/15/2023)   Overall Financial Resource Strain (CARDIA)    Difficulty of Paying Living Expenses: Not very hard  Food Insecurity: No Food Insecurity (02/15/2023)   Hunger Vital Sign    Worried About Running Out of Food in the Last Year: Never true    Ran Out of Food in the Last Year: Never true  Transportation Needs: No Transportation Needs (02/15/2023)   PRAPARE - Administrator, Civil Service (Medical): No    Lack of Transportation (Non-Medical): No  Physical Activity: Unknown (02/15/2023)   Exercise Vital Sign    Days of  Exercise per Week: 0 days    Minutes of Exercise per Session: Not on file  Stress: No Stress Concern Present (02/15/2023)   [employer] of Occupational Health - Occupational Stress Questionnaire    Feeling of Stress : Only a little  Social Connections: Moderately Integrated (02/15/2023)   Social Connection and Isolation Panel [NHANES]    Frequency of Communication with Friends and Family: More than three times a week    Frequency of Social Gatherings with Friends and Family: Once a week    Attends Religious Services: More than 4 times per year    Active Member of [employer] or Organizations: Yes    Attends Banker Meetings: More than 4 times per year    Marital Status: Widowed  Intimate Partner Violence: Not on file    Review of Systems: Gen: Denies any fever, chills, cold or flulike symptoms, presyncope, syncope. CV: Denies chest pain, heart palpitations. Resp: Admits to occasional shortness of breath with exertion.  No cough. GI: See HPI GU : Denies urinary burning, urinary frequency, urinary hesitancy MS: Denies joint pain. Derm: Denies rash. Psych: Denies depression, anxiety. Heme: See HPI  Physical Exam: BP 135/83 (BP Location: Right Arm, Patient Position: Sitting, Cuff Size: Large)   Pulse 93    Temp (!) 97.4 F (36.3 C) (Oral)   Ht 5\' 4"  (1.626 m)   Wt 187 lb 3.2 oz (84.9 kg)   LMP  (LMP Unknown)   SpO2 97%   BMI 32.13 kg/m  General:   Alert and oriented. Pleasant and cooperative. Well-nourished and well-developed.  Head:  Normocephalic and atraumatic. Eyes:  Without icterus, sclera clear and conjunctiva pink.  Ears:  Normal auditory acuity. Lungs:  Clear to auscultation bilaterally. No wheezes, rales, or rhonchi. No distress.  Heart:  S1, S2 present without murmurs appreciated.  Abdomen:  +BS, soft, and non-distended.  Mild TTP in epigastric area.  No HSM noted. No guarding or rebound. No masses appreciated.  Rectal:  Deferred  Msk:  Symmetrical without gross deformities. Normal posture. Extremities:  Without edema. Neurologic:  Alert and  oriented x4;  grossly normal neurologically. Skin:  Intact without significant lesions or rashes. Psych:  Normal mood and affect.    Assessment:  63 y.o. female with history of HTN, HLD, hyperparathyroidism, CKD, GERD, Schatzki's ring, PUD in the setting of NSAIDs, presenting today at the request of Luking, Scott A, MD for positive fecal occult blood test.  Positive fecal occult blood test: Completed 03/20/2023.  Patient denies overt GI bleeding.  No significant upper or lower GI symptoms at this time. Constipation is well controlled on colace.  She has had some mild mid lower abdominal pain intermittently for the last few months which may be related to urinary retention noted on recent renal ultrasound.  Denies any relation to bowel movements.  She needs a colonoscopy for further evaluation of positive fecal occult blood test.  Will also update CBC to ensure she is not developing anemia.   Plan:  CBC Proceed with colonoscopy with propofol by Dr. Marletta Lor in near future. The risks, benefits, and alternatives have been discussed with the patient in detail. The patient states understanding and desires to proceed.  ASA 2 Continue colace.   Follow-up after colonoscopy as Dr. Marletta Lor recommends.   Ermalinda Memos, PA-C [facility] 04/16/2023

## 2023-04-14 NOTE — H&P (View-Only) (Signed)
 Referring Provider: Babs Sciara, MD Primary Care Physician:  Babs Sciara, MD Primary Gastroenterologist:  Dr. Marletta Lor  Chief Complaint  Patient presents with   Positive Fecal Occult Test    States that she does have trouble having a bm at times. Not aware of any issues with hemorrhoids.    HPI:   Sandra Trujillo is a 63 y.o. female with history of HTN, HLD, hyperparathyroidism, CKD, GERD, Schatzki's ring, PUD in the setting of NSAIDs, presenting today at the request of Luking, Jonna Coup, MD for fecal occult blood positive.  Reviewed labs.  iFOBT was + 03/20/2023.  We have not seen patient since 2017.   Today: No brbpr or melena.  No unintentional weight loss.  Occasional constipation, but this is well-controlled with Colace 100 mg daily.   Intermittent mid low abdominal pain for the last few months. More so present when waking up in the morning. Dull. Reports she is retaining some urine. Recent renal US with 15% post void residual.  Denies dysuria, hematuria.  Not related to bowel movements.   GERD under control for the most part on pantoprazole 40 mg BID.  Has been off of pantoprazole as she ran out, but resumed in January.  When off of pantoprazole, she had a lot of symptoms.  No dysphagia. Sometimes things go down the wrong way.     Last EGD 04/02/2015: Mild erosive gastritis, probable proximal esophageal stricture dilated with savory dilation from 14 to 17 mm.  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  Mild diverticulosis in the sigmoid and descending colon.  Redundant left colon.  Recommended repeat colonoscopy in 10 years.  Past Medical History:  Diagnosis Date   Anemia    CKD (chronic kidney disease)    GERD (gastroesophageal reflux disease)    Hyperlipidemia    Hyperparathyroidism (HCC)    Hypertension     Past Surgical History:  Procedure Laterality Date   CERVICAL DISCECTOMY     COLONOSCOPY N/A  09/15/2014   SLF: 1. submucosal lesion in the hepatic flexure likely a lipoma 2 . mild diverticulosis in teh sigmoid colon and descending colon 3. the left colon is redundant   COLONOSCOPY WITH ESOPHAGOGASTRODUODENOSCOPY (EGD)  12/19/2002   [id number] HH/mildchanges of reflux/otherwise normal. normal colonoscopy and terminal ileoscopy.   ESOPHAGEAL DILATION N/A 04/02/2015   Procedure: ESOPHAGEAL DILATION;  Surgeon: West Bali, MD;  [facility];  Service: Endoscopy;  Laterality: N/A;   ESOPHAGOGASTRODUODENOSCOPY N/A 09/15/2014   SLF: 1. Schatzki ring at the gastroesophagel junction 2. moderate erosive gastritis/duoentitit and three small gastric ulcers due to ASA/ibuprofen   ESOPHAGOGASTRODUODENOSCOPY N/A 04/02/2015   Procedure: ESOPHAGOGASTRODUODENOSCOPY (EGD);  Surgeon: West Bali, MD;  [facility];  Service: Endoscopy;  Laterality: N/A;  1315 - moved to 2/13 @ 2:00   PARATHYROIDECTOMY N/A 06/28/2021   Procedure: NECK EXPLORATION WITH  LEFT SUPERIOR AND RIGHT INFERIOR PARATHYROIDECTOMY BIOPSY OF  LEFT INFERIOR PARATHYROID, AUTOTRANSPLANTATION OF LEFT INFERIOR PARATHYROID TO SCM;  Surgeon: Darnell Level, MD;  Location: [facility];  Service: General;  Laterality: N/A;   SAVORY DILATION N/A 09/15/2014   Procedure: SAVORY DILATION;  Surgeon: West Bali, MD;  [facility];  Service: Endoscopy;  Laterality: N/A;   WISDOM TOOTH EXTRACTION      Current Outpatient Medications  Medication Sig Dispense Refill   acetaminophen (TYLENOL) 500 MG tablet Take 500 mg by mouth every 6 (six)  hours as needed for mild pain.     ADBRY 150 MG/ML SOSY Inject 150 mg into the skin every 14 (fourteen) days.     chlorpheniramine (CHLOR-TRIMETON) 4 MG tablet Take 4 mg by mouth at bedtime.     Cholecalciferol (VITAMIN D) 2000 UNITS CAPS Take 2,000 Units by mouth daily.     clobetasol ointment (TEMOVATE) 0.05 % Apply 1 application. topically 2 (two) times daily as needed (rash).      docusate sodium (COLACE) 100 MG capsule Take 100 mg by mouth daily.     fluticasone (FLONASE) 50 MCG/ACT nasal spray SHAKE LIQUID AND USE 2 SPRAYS IN EACH NOSTRIL DAILY 16 g 1   indapamide (LOZOL) 2.5 MG tablet TAKE 1 TABLET(2.5 MG) BY MOUTH DAILY 90 tablet 1   losartan (COZAAR) 100 MG tablet TAKE 1 TABLET BY MOUTH EVERY DAY 90 tablet 3   mometasone (ELOCON) 0.1 % cream Apply 1 application. topically 2 (two) times daily as needed for rash.     pantoprazole (PROTONIX) 40 MG tablet Take 1 tablet (40 mg total) by mouth 2 (two) times daily before a meal. 180 tablet 3   rosuvastatin (CRESTOR) 40 MG tablet Take 1 tablet (40 mg total) by mouth daily. 90 tablet 3   traZODone (DESYREL) 100 MG tablet Take 1 tablet (100 mg total) by mouth at bedtime as needed for sleep. 90 tablet 1   No current facility-administered medications for this visit.    Allergies as of 04/16/2023 - Review Complete 04/16/2023  Allergen Reaction Noted   Sulfa antibiotics Other (See Comments) 12/16/2012   Lisinopril Cough 05/22/2014    Family History  Problem Relation Age of Onset   Hypertension Father    Heart failure Mother    Stroke Mother    Hypertension Mother    Hyperlipidemia Mother    Colon cancer Neg Hx    Inflammatory bowel disease Neg Hx    Liver disease Neg Hx     Social History   Socioeconomic History   Marital status: Widowed    Spouse name: Not on file   Number of children: 1   Years of education: Not on file   Highest education level: Bachelor's degree (e.g., BA, AB, BS)  Occupational History   Occupation: [employer]  Tobacco Use   Smoking status: Every Day    Current packs/day: 0.50    Average packs/day: 0.5 packs/day for 25.0 years (12.5 ttl pk-yrs)    Types: Cigarettes   Smokeless tobacco: Never  Vaping Use   Vaping status: Never Used  Substance and Sexual Activity   Alcohol use: No    Alcohol/week: 0.0 standard drinks of alcohol   Drug use: No   Sexual activity: Yes  Other Topics Concern    Not on file  Social History Narrative   Not on file   Social Drivers of Health   Financial Resource Strain: Low Risk  (02/15/2023)   Overall Financial Resource Strain (CARDIA)    Difficulty of Paying Living Expenses: Not very hard  Food Insecurity: No Food Insecurity (02/15/2023)   Hunger Vital Sign    Worried About Running Out of Food in the Last Year: Never true    Ran Out of Food in the Last Year: Never true  Transportation Needs: No Transportation Needs (02/15/2023)   PRAPARE - Administrator, Civil Service (Medical): No    Lack of Transportation (Non-Medical): No  Physical Activity: Unknown (02/15/2023)   Exercise Vital Sign    Days of  Exercise per Week: 0 days    Minutes of Exercise per Session: Not on file  Stress: No Stress Concern Present (02/15/2023)   [employer] of Occupational Health - Occupational Stress Questionnaire    Feeling of Stress : Only a little  Social Connections: Moderately Integrated (02/15/2023)   Social Connection and Isolation Panel [NHANES]    Frequency of Communication with Friends and Family: More than three times a week    Frequency of Social Gatherings with Friends and Family: Once a week    Attends Religious Services: More than 4 times per year    Active Member of [employer] or Organizations: Yes    Attends Banker Meetings: More than 4 times per year    Marital Status: Widowed  Intimate Partner Violence: Not on file    Review of Systems: Gen: Denies any fever, chills, cold or flulike symptoms, presyncope, syncope. CV: Denies chest pain, heart palpitations. Resp: Admits to occasional shortness of breath with exertion.  No cough. GI: See HPI GU : Denies urinary burning, urinary frequency, urinary hesitancy MS: Denies joint pain. Derm: Denies rash. Psych: Denies depression, anxiety. Heme: See HPI  Physical Exam: BP 135/83 (BP Location: Right Arm, Patient Position: Sitting, Cuff Size: Large)   Pulse 93    Temp (!) 97.4 F (36.3 C) (Oral)   Ht 5\' 4"  (1.626 m)   Wt 187 lb 3.2 oz (84.9 kg)   LMP  (LMP Unknown)   SpO2 97%   BMI 32.13 kg/m  General:   Alert and oriented. Pleasant and cooperative. Well-nourished and well-developed.  Head:  Normocephalic and atraumatic. Eyes:  Without icterus, sclera clear and conjunctiva pink.  Ears:  Normal auditory acuity. Lungs:  Clear to auscultation bilaterally. No wheezes, rales, or rhonchi. No distress.  Heart:  S1, S2 present without murmurs appreciated.  Abdomen:  +BS, soft, and non-distended.  Mild TTP in epigastric area.  No HSM noted. No guarding or rebound. No masses appreciated.  Rectal:  Deferred  Msk:  Symmetrical without gross deformities. Normal posture. Extremities:  Without edema. Neurologic:  Alert and  oriented x4;  grossly normal neurologically. Skin:  Intact without significant lesions or rashes. Psych:  Normal mood and affect.    Assessment:  63 y.o. female with history of HTN, HLD, hyperparathyroidism, CKD, GERD, Schatzki's ring, PUD in the setting of NSAIDs, presenting today at the request of Luking, Scott A, MD for positive fecal occult blood test.  Positive fecal occult blood test: Completed 03/20/2023.  Patient denies overt GI bleeding.  No significant upper or lower GI symptoms at this time. Constipation is well controlled on colace.  She has had some mild mid lower abdominal pain intermittently for the last few months which may be related to urinary retention noted on recent renal ultrasound.  Denies any relation to bowel movements.  She needs a colonoscopy for further evaluation of positive fecal occult blood test.  Will also update CBC to ensure she is not developing anemia.   Plan:  CBC Proceed with colonoscopy with propofol by Dr. Marletta Lor in near future. The risks, benefits, and alternatives have been discussed with the patient in detail. The patient states understanding and desires to proceed.  ASA 2 Continue colace.   Follow-up after colonoscopy as Dr. Marletta Lor recommends.   Ermalinda Memos, PA-C [facility] 04/16/2023

## 2023-04-16 ENCOUNTER — Ambulatory Visit: Payer: BC Managed Care – PPO | Admitting: Gastroenterology

## 2023-04-16 ENCOUNTER — Encounter: Payer: Self-pay | Admitting: Gastroenterology

## 2023-04-16 ENCOUNTER — Other Ambulatory Visit: Payer: Self-pay | Admitting: *Deleted

## 2023-04-16 ENCOUNTER — Encounter: Payer: Self-pay | Admitting: *Deleted

## 2023-04-16 VITALS — BP 135/83 | HR 93 | Temp 97.4°F | Ht 64.0 in | Wt 187.2 lb

## 2023-04-16 DIAGNOSIS — R195 Other fecal abnormalities: Secondary | ICD-10-CM

## 2023-04-16 DIAGNOSIS — R102 Pelvic and perineal pain: Secondary | ICD-10-CM

## 2023-04-16 DIAGNOSIS — R739 Hyperglycemia, unspecified: Secondary | ICD-10-CM | POA: Diagnosis not present

## 2023-04-16 MED ORDER — PEG 3350-KCL-NA BICARB-NACL 420 G PO SOLR: 4000.0000 mL | Freq: Once | ORAL | 0 refills | Status: AC

## 2023-04-16 NOTE — Patient Instructions (Signed)
 Please have blood work completed at [facility] to check your hemoglobin.  We will get you scheduled for a colonoscopy in the near future with Dr. Marletta Lor.  Continue using Colace for constipation.  I will see back in the office as Dr. Marletta Lor recommends at the time of your procedure.   It was nice to meet you today!  Ermalinda Memos, PA-C [facility]

## 2023-04-17 ENCOUNTER — Encounter: Payer: Self-pay | Admitting: Family Medicine

## 2023-04-17 LAB — CBC WITH DIFFERENTIAL/PLATELET
Basophils Absolute: 0.1 10*3/uL (ref 0.0–0.2)
Basos: 1 %
EOS (ABSOLUTE): 0.2 10*3/uL (ref 0.0–0.4)
Eos: 2 %
Hematocrit: 42.1 % (ref 34.0–46.6)
Hemoglobin: 14.3 g/dL (ref 11.1–15.9)
Immature Grans (Abs): 0 10*3/uL (ref 0.0–0.1)
Immature Granulocytes: 0 %
Lymphocytes Absolute: 3 10*3/uL (ref 0.7–3.1)
Lymphs: 39 %
MCH: 29.7 pg (ref 26.6–33.0)
MCHC: 34 g/dL (ref 31.5–35.7)
MCV: 88 fL (ref 79–97)
Monocytes Absolute: 0.7 10*3/uL (ref 0.1–0.9)
Monocytes: 8 %
Neutrophils Absolute: 3.9 10*3/uL (ref 1.4–7.0)
Neutrophils: 50 %
Platelets: 290 10*3/uL (ref 150–450)
RBC: 4.81 x10E6/uL (ref 3.77–5.28)
RDW: 12.6 % (ref 11.7–15.4)
WBC: 7.9 10*3/uL (ref 3.4–10.8)

## 2023-04-17 LAB — PARATHYROID HORMONE, INTACT (NO CA): PTH: 30 pg/mL (ref 15–65)

## 2023-04-17 LAB — VITAMIN D 25 HYDROXY (VIT D DEFICIENCY, FRACTURES): Vit D, 25-Hydroxy: 37.1 ng/mL (ref 30.0–100.0)

## 2023-04-17 LAB — HEMOGLOBIN A1C
Est. average glucose Bld gHb Est-mCnc: 137 mg/dL
Hgb A1c MFr Bld: 6.4 % — ABNORMAL HIGH (ref 4.8–5.6)

## 2023-04-17 LAB — PHOSPHORUS: Phosphorus: 3.3 mg/dL (ref 3.0–4.3)

## 2023-04-20 ENCOUNTER — Encounter: Payer: Self-pay | Admitting: Gastroenterology

## 2023-04-27 DIAGNOSIS — L818 Other specified disorders of pigmentation: Secondary | ICD-10-CM | POA: Diagnosis not present

## 2023-04-27 DIAGNOSIS — Z79899 Other long term (current) drug therapy: Secondary | ICD-10-CM | POA: Diagnosis not present

## 2023-04-27 DIAGNOSIS — Z111 Encounter for screening for respiratory tuberculosis: Secondary | ICD-10-CM | POA: Diagnosis not present

## 2023-04-27 DIAGNOSIS — L2089 Other atopic dermatitis: Secondary | ICD-10-CM | POA: Diagnosis not present

## 2023-04-28 ENCOUNTER — Other Ambulatory Visit: Payer: Self-pay

## 2023-04-28 ENCOUNTER — Ambulatory Visit (HOSPITAL_COMMUNITY): Admitting: Anesthesiology

## 2023-04-28 ENCOUNTER — Encounter (HOSPITAL_COMMUNITY)
Admission: RE | Disposition: A | Discharge status: Home / Self Care | Payer: Self-pay | Source: Home / Self Care | Attending: Internal Medicine

## 2023-04-28 ENCOUNTER — Encounter (HOSPITAL_COMMUNITY): Payer: Self-pay | Admitting: Internal Medicine

## 2023-04-28 ENCOUNTER — Ambulatory Visit (HOSPITAL_COMMUNITY)
Admission: RE | Admit: 2023-04-28 | Discharge: 2023-04-28 | Disposition: A | Discharge status: Home / Self Care | Payer: BC Managed Care – PPO | Attending: Internal Medicine | Admitting: Internal Medicine

## 2023-04-28 DIAGNOSIS — K5900 Constipation, unspecified: Secondary | ICD-10-CM | POA: Insufficient documentation

## 2023-04-28 DIAGNOSIS — Z79899 Other long term (current) drug therapy: Secondary | ICD-10-CM | POA: Insufficient documentation

## 2023-04-28 DIAGNOSIS — K219 Gastro-esophageal reflux disease without esophagitis: Secondary | ICD-10-CM | POA: Insufficient documentation

## 2023-04-28 DIAGNOSIS — K648 Other hemorrhoids: Secondary | ICD-10-CM | POA: Diagnosis not present

## 2023-04-28 DIAGNOSIS — K5730 Diverticulosis of large intestine without perforation or abscess without bleeding: Secondary | ICD-10-CM | POA: Insufficient documentation

## 2023-04-28 DIAGNOSIS — E785 Hyperlipidemia, unspecified: Secondary | ICD-10-CM | POA: Insufficient documentation

## 2023-04-28 DIAGNOSIS — D1779 Benign lipomatous neoplasm of other sites: Secondary | ICD-10-CM | POA: Insufficient documentation

## 2023-04-28 DIAGNOSIS — E213 Hyperparathyroidism, unspecified: Secondary | ICD-10-CM | POA: Insufficient documentation

## 2023-04-28 DIAGNOSIS — Z8711 Personal history of peptic ulcer disease: Secondary | ICD-10-CM | POA: Diagnosis not present

## 2023-04-28 DIAGNOSIS — D175 Benign lipomatous neoplasm of intra-abdominal organs: Secondary | ICD-10-CM

## 2023-04-28 DIAGNOSIS — N189 Chronic kidney disease, unspecified: Secondary | ICD-10-CM | POA: Diagnosis not present

## 2023-04-28 DIAGNOSIS — I10 Essential (primary) hypertension: Secondary | ICD-10-CM | POA: Diagnosis not present

## 2023-04-28 DIAGNOSIS — F17210 Nicotine dependence, cigarettes, uncomplicated: Secondary | ICD-10-CM | POA: Diagnosis not present

## 2023-04-28 DIAGNOSIS — R195 Other fecal abnormalities: Secondary | ICD-10-CM | POA: Diagnosis not present

## 2023-04-28 DIAGNOSIS — I129 Hypertensive chronic kidney disease with stage 1 through stage 4 chronic kidney disease, or unspecified chronic kidney disease: Secondary | ICD-10-CM | POA: Insufficient documentation

## 2023-04-28 DIAGNOSIS — Z1211 Encounter for screening for malignant neoplasm of colon: Secondary | ICD-10-CM | POA: Diagnosis not present

## 2023-04-28 HISTORY — PX: COLONOSCOPY WITH PROPOFOL: SHX5780

## 2023-04-28 SURGERY — COLONOSCOPY WITH PROPOFOL
Anesthesia: General

## 2023-04-28 MED ORDER — LACTATED RINGERS IV SOLN: INTRAVENOUS | Status: DC

## 2023-04-28 MED ORDER — LIDOCAINE HCL (CARDIAC) PF 100 MG/5ML IV SOSY
PREFILLED_SYRINGE | INTRAVENOUS | Status: DC | PRN
  Administered 2023-04-28: 60 mg via INTRATRACHEAL

## 2023-04-28 MED ORDER — PROPOFOL 10 MG/ML IV BOLUS
INTRAVENOUS | Status: DC | PRN
  Administered 2023-04-28: 40 mg via INTRAVENOUS
  Administered 2023-04-28: 80 mg via INTRAVENOUS

## 2023-04-28 MED ORDER — PROPOFOL 500 MG/50ML IV EMUL
INTRAVENOUS | Status: DC | PRN
  Administered 2023-04-28: 125 ug/kg/min via INTRAVENOUS

## 2023-04-28 NOTE — Transfer of Care (Addendum)
 Immediate Anesthesia Transfer of Care Note  Patient: Sandra Trujillo  Procedure(s) Performed: COLONOSCOPY WITH PROPOFOL  Patient Location: Endoscopy Unit  Anesthesia Type:General  Level of Consciousness: drowsy and patient cooperative  Airway & Oxygen Therapy: Patient Spontanous Breathing and Patient connected to nasal cannula oxygen  Post-op Assessment: Report given to RN and Post -op Vital signs reviewed and stable  Post vital signs: Reviewed and stable  Last Vitals:  Vitals Value Taken Time  BP 144/96 04/28/23   1315  Temp 36.8 04/28/23   1315  Pulse 95 04/28/23   1315  Resp 19 04/28/23   1315  SpO2 99% 04/28/23   1315    Last Pain:  Vitals:   04/28/23 1351  TempSrc:   PainSc: 0-No pain      Patients Stated Pain Goal: 8 (04/28/23 1315)  Complications: No notable events documented.

## 2023-04-28 NOTE — Op Note (Signed)
 [facility] Patient Name: Sandra Trujillo Procedure Date: 04/28/2023 1:41 PM MRN: [id number] Date of Birth: [birth year] Attending MD: Hennie Duos. Marletta Lor , [place], [id number] CSN: [id number] Age: 63 Admit Type: Outpatient Procedure:                Colonoscopy Indications:              Screening for colorectal malignant neoplasm,                            Incidental - Heme positive stool Providers:                Hennie Duos. Marletta Lor, DO, Crystal Page, Lennice Sites                            Technician, Technician Referring MD:              Medicines:                See the Anesthesia note for documentation of the                            administered medications Complications:            No immediate complications. Estimated Blood Loss:     Estimated blood loss: none. Procedure:                Pre-Anesthesia Assessment:                           - The anesthesia plan was to use monitored                            anesthesia care (MAC).                           After obtaining informed consent, the colonoscope                            was passed under direct vision. Throughout the                            procedure, the patient's blood pressure, pulse, and                            oxygen saturations were monitored continuously. The                            PCF-HQ190L ([id number]) scope was introduced through                            the anus and advanced to the the cecum, identified                            by appendiceal orifice and ileocecal valve. The                            colonoscopy was performed without difficulty. The  patient tolerated the procedure well. The quality                            of the bowel preparation was evaluated using the                            BBPS [facility] Bowel Preparation Scale) with scores                            of: Right Colon = 2 (minor amount of residual                            staining, small fragments  of stool and/or opaque                            liquid, but mucosa seen well), Transverse Colon = 2                            (minor amount of residual staining, small fragments                            of stool and/or opaque liquid, but mucosa seen                            well) and Left Colon = 2 (minor amount of residual                            staining, small fragments of stool and/or opaque                            liquid, but mucosa seen well). The total BBPS score                            equals 6. The quality of the bowel preparation was                            good. Scope In: 1:55:54 PM Scope Out: 2:08:30 PM Scope Withdrawal Time: 0 hours 9 minutes 15 seconds  Total Procedure Duration: 0 hours 12 minutes 36 seconds  Findings:      Non-bleeding internal hemorrhoids were found during endoscopy.      Multiple large-mouthed and small-mouthed diverticula were found in the       sigmoid colon and descending colon.      There was a medium-sized lipoma, in the ascending colon.      The exam was otherwise without abnormality. Impression:               - Non-bleeding internal hemorrhoids.                           - Diverticulosis in the sigmoid colon and in the                            descending colon.                           -  Medium-sized lipoma in the ascending colon.                           - The examination was otherwise normal.                           - No specimens collected. Moderate Sedation:      Per Anesthesia Care Recommendation:           - Patient has a contact number available for                            emergencies. The signs and symptoms of potential                            delayed complications were discussed with the                            patient. Return to normal activities tomorrow.                            Written discharge instructions were provided to the                            patient.                           -  Resume previous diet.                           - Continue present medications.                           - Repeat colonoscopy in 10 years for screening                            purposes.                           - Return to GI clinic PRN. Procedure Code(s):        --- Professional ---                           O9629, Colorectal cancer screening; colonoscopy on                            individual not meeting criteria for high risk Diagnosis Code(s):        --- Professional ---                           Z12.11, Encounter for screening for malignant                            neoplasm of colon                           K64.8, Other hemorrhoids  D17.5, Benign lipomatous neoplasm of                            intra-abdominal organs                           K57.30, Diverticulosis of large intestine without                            perforation or abscess without bleeding CPT copyright 2022 American Medical Association. All rights reserved. The codes documented in this report are preliminary and upon coder review may  be revised to meet current compliance requirements. Hennie Duos. Marletta Lor, DO Hennie Duos. Marletta Lor, DO 04/28/2023 2:14:52 PM This report has been signed electronically. Number of Addenda: 0

## 2023-04-28 NOTE — Discharge Instructions (Addendum)
  Colonoscopy Discharge Instructions  Read the instructions outlined below and refer to this sheet in the next few weeks. These discharge instructions provide you with general information on caring for yourself after you leave the hospital. Your doctor may also give you specific instructions. While your treatment has been planned according to the most current medical practices available, unavoidable complications occasionally occur.   ACTIVITY You may resume your regular activity, but move at a slower pace for the next 24 hours.  Take frequent rest periods for the next 24 hours.  Walking will help get rid of the air and reduce the bloated feeling in your belly (abdomen).  No driving for 24 hours (because of the medicine (anesthesia) used during the test).   Do not sign any important legal documents or operate any machinery for 24 hours (because of the anesthesia used during the test).  NUTRITION Drink plenty of fluids.  You may resume your normal diet as instructed by your doctor.  Begin with a light meal and progress to your normal diet. Heavy or fried foods are harder to digest and may make you feel sick to your stomach (nauseated).  Avoid alcoholic beverages for 24 hours or as instructed.  MEDICATIONS You may resume your normal medications unless your doctor tells you otherwise.  WHAT YOU CAN EXPECT TODAY Some feelings of bloating in the abdomen.  Passage of more gas than usual.  Spotting of blood in your stool or on the toilet paper.  IF YOU HAD POLYPS REMOVED DURING THE COLONOSCOPY: No aspirin products for 7 days or as instructed.  No alcohol for 7 days or as instructed.  Eat a soft diet for the next 24 hours.  FINDING OUT THE RESULTS OF YOUR TEST Not all test results are available during your visit. If your test results are not back during the visit, make an appointment with your caregiver to find out the results. Do not assume everything is normal if you have not heard from your  caregiver or the medical facility. It is important for you to follow up on all of your test results.  SEEK IMMEDIATE MEDICAL ATTENTION IF: You have more than a spotting of blood in your stool.  Your belly is swollen (abdominal distention).  You are nauseated or vomiting.  You have a temperature over 101.  You have abdominal pain or discomfort that is severe or gets worse throughout the day.   Your colonoscopy was relatively unremarkable.  I did not find any polyps or evidence of colon cancer.  I recommend repeating colonoscopy in 10 years for colon cancer screening purposes.    You have a medium sized lipoma in the right side your colon, this was seen previously.  These are benign fatty lesions, nothing to worry about.  You do have diverticulosis and internal hemorrhoids. I would recommend increasing fiber in your diet or adding OTC Benefiber/Metamucil. Be sure to drink at least 4 to 6 glasses of water daily. Follow-up with GI as needed.   I hope you have a great rest of your week!  Hennie Duos. Marletta Lor, D.O. Gastroenterology and Hepatology [facility]

## 2023-04-28 NOTE — Anesthesia Preprocedure Evaluation (Signed)
 Anesthesia Evaluation  Patient identified by MRN, date of birth, ID band Patient awake    Reviewed: Allergy & Precautions, H&P , NPO status , Patient's Chart, lab work & pertinent test results, reviewed documented beta blocker date and time   Airway Mallampati: II  TM Distance: >3 FB Neck ROM: full    Dental no notable dental hx.    Pulmonary neg pulmonary ROS, Current Smoker   Pulmonary exam normal breath sounds clear to auscultation       Cardiovascular Exercise Tolerance: Good hypertension, negative cardio ROS  Rhythm:regular Rate:Normal     Neuro/Psych negative neurological ROS  negative psych ROS   GI/Hepatic negative GI ROS, Neg liver ROS, PUD,GERD  ,,  Endo/Other  negative endocrine ROS    Renal/GU Renal diseasenegative Renal ROS  negative genitourinary   Musculoskeletal   Abdominal   Peds  Hematology negative hematology ROS (+) Blood dyscrasia, anemia   Anesthesia Other Findings   Reproductive/Obstetrics negative OB ROS                             Anesthesia Physical Anesthesia Plan  ASA: 2  Anesthesia Plan: General   Post-op Pain Management:    Induction:   PONV Risk Score and Plan: Propofol infusion  Airway Management Planned:   Additional Equipment:   Intra-op Plan:   Post-operative Plan:   Informed Consent: I have reviewed the patients History and Physical, chart, labs and discussed the procedure including the risks, benefits and alternatives for the proposed anesthesia with the patient or authorized representative who has indicated his/her understanding and acceptance.     Dental Advisory Given  Plan Discussed with: CRNA  Anesthesia Plan Comments:        Anesthesia Quick Evaluation

## 2023-04-28 NOTE — Interval H&P Note (Signed)
 History and Physical Interval Note:  04/28/2023 1:31 PM  Sandra Trujillo  has presented today for surgery, with the diagnosis of positive FOBT.  The various methods of treatment have been discussed with the patient and family. After consideration of risks, benefits and other options for treatment, the patient has consented to  Procedure(s) with comments: COLONOSCOPY WITH PROPOFOL (N/A) - 2:30 pm, asa 2 as a surgical intervention.  The patient's history has been reviewed, patient examined, no change in status, stable for surgery.  I have reviewed the patient's chart and labs.  Questions were answered to the patient's satisfaction.     Lanelle Bal

## 2023-04-29 ENCOUNTER — Encounter (HOSPITAL_COMMUNITY): Payer: Self-pay | Admitting: Internal Medicine

## 2023-05-02 NOTE — Anesthesia Postprocedure Evaluation (Signed)
 Anesthesia Post Note  Patient: Elige Radon  Procedure(s) Performed: COLONOSCOPY WITH PROPOFOL  Patient location during evaluation: Phase II Anesthesia Type: General Level of consciousness: awake Pain management: pain level controlled Vital Signs Assessment: post-procedure vital signs reviewed and stable Respiratory status: spontaneous breathing and respiratory function stable Cardiovascular status: blood pressure returned to baseline and stable Postop Assessment: no headache and no apparent nausea or vomiting Anesthetic complications: no Comments: Late entry   No notable events documented.   Last Vitals:  Vitals:   04/28/23 1417 04/28/23 1423  BP: 110/70 117/88  Pulse: 100 89  Resp: 20 18  Temp:    SpO2: 98% 98%    Last Pain:  Vitals:   04/28/23 1412  TempSrc: Oral  PainSc: 0-No pain                 Windell Norfolk

## 2023-05-11 ENCOUNTER — Other Ambulatory Visit: Payer: Self-pay

## 2023-05-11 ENCOUNTER — Other Ambulatory Visit: Payer: Self-pay | Admitting: Family Medicine

## 2023-05-11 MED ORDER — FLUTICASONE PROPIONATE 50 MCG/ACT NA SUSP: NASAL | 1 refills | Status: AC

## 2023-05-15 DIAGNOSIS — N3001 Acute cystitis with hematuria: Secondary | ICD-10-CM | POA: Diagnosis not present

## 2023-05-26 DIAGNOSIS — E210 Primary hyperparathyroidism: Secondary | ICD-10-CM | POA: Diagnosis not present

## 2023-05-26 DIAGNOSIS — N189 Chronic kidney disease, unspecified: Secondary | ICD-10-CM | POA: Diagnosis not present

## 2023-06-04 DIAGNOSIS — I10 Essential (primary) hypertension: Secondary | ICD-10-CM | POA: Diagnosis not present

## 2023-06-04 DIAGNOSIS — E210 Primary hyperparathyroidism: Secondary | ICD-10-CM | POA: Diagnosis not present

## 2023-06-04 DIAGNOSIS — N1832 Chronic kidney disease, stage 3b: Secondary | ICD-10-CM | POA: Diagnosis not present

## 2023-07-21 DIAGNOSIS — Z79899 Other long term (current) drug therapy: Secondary | ICD-10-CM | POA: Diagnosis not present

## 2023-07-21 DIAGNOSIS — K629 Disease of anus and rectum, unspecified: Secondary | ICD-10-CM | POA: Diagnosis not present

## 2023-07-21 DIAGNOSIS — R82998 Other abnormal findings in urine: Secondary | ICD-10-CM | POA: Diagnosis not present

## 2023-07-21 DIAGNOSIS — N760 Acute vaginitis: Secondary | ICD-10-CM | POA: Diagnosis not present

## 2023-07-21 DIAGNOSIS — L818 Other specified disorders of pigmentation: Secondary | ICD-10-CM | POA: Diagnosis not present

## 2023-07-21 DIAGNOSIS — L2089 Other atopic dermatitis: Secondary | ICD-10-CM | POA: Diagnosis not present

## 2023-07-21 DIAGNOSIS — N898 Other specified noninflammatory disorders of vagina: Secondary | ICD-10-CM | POA: Diagnosis not present

## 2023-08-11 ENCOUNTER — Other Ambulatory Visit: Payer: Self-pay | Admitting: Family Medicine

## 2023-08-17 ENCOUNTER — Ambulatory Visit: Payer: BC Managed Care – PPO | Admitting: Family Medicine

## 2023-08-17 VITALS — BP 123/82 | Temp 97.3°F | Ht 64.0 in | Wt 190.0 lb

## 2023-08-17 DIAGNOSIS — Z79899 Other long term (current) drug therapy: Secondary | ICD-10-CM

## 2023-08-17 DIAGNOSIS — I10 Essential (primary) hypertension: Secondary | ICD-10-CM | POA: Diagnosis not present

## 2023-08-17 DIAGNOSIS — R7303 Prediabetes: Secondary | ICD-10-CM | POA: Diagnosis not present

## 2023-08-17 DIAGNOSIS — N1831 Chronic kidney disease, stage 3a: Secondary | ICD-10-CM | POA: Diagnosis not present

## 2023-08-17 DIAGNOSIS — E785 Hyperlipidemia, unspecified: Secondary | ICD-10-CM

## 2023-08-17 NOTE — Progress Notes (Signed)
   Subjective:    Patient ID: Sandra Trujillo, female    DOB: [birth year], 63 y.o.   MRN: [id number]  HPI Patient relates overall doing fairly well Still struggling with the loss of her husband Her overall health is doing okay She does ATB for relaxation She does walk some Tries to eat relatively healthy Has CKD stable being followed by nephrology Patient for blood pressure check up.  The patient does have hypertension.   Patient relates dietary measures try to minimize salt The importance of healthy diet and activity were discussed Patient relates compliance  Patient here for follow-up regarding cholesterol.    Patient relates taking medication on a regular basis Denies problems with medication Importance of dietary measures discussed Regular lab work regarding lipid and liver was checked and if needing additional labs was appropriately ordered  Labs ordered Discussed the use of AI scribe software for clinical note transcription with the patient, who gave verbal consent to proceed.  History of Present Illness   Sandra Trujillo is a 63 year old female who presents with blood in her urine.  She has experienced hematuria for a 24-hour period. An ultrasound was performed, which showed normal kidney size. She is scheduled for an appointment with a urologist for further evaluation.  She has a history of hypertension, which is well-controlled. She maintains a reasonable diet by watching salt intake and staying hydrated, though she finds it challenging to incorporate regular exercise into her routine. No issues with swelling and her blood pressure is well-controlled.  Her A1c was 6.4 in February, indicating prediabetes. She monitors her blood sugar levels and minimizes simple sugars in her diet.  She experiences variable bowel movements, with some days being constipated. She takes Colace and attempts to include fiber in her diet. A colonoscopy was normal, and she is scheduled for another in  ten years.  She has a history of smoking, which has been challenging to quit since the death of her partner. She was previously down to two to three cigarettes a day but has found it difficult to maintain this reduction due to stress and habit.  She uses Systane eye drops for dry eyes, which she attributes to allergies. Her vision is adequate despite the discomfort.  She engages in ATV riding as a hobby, often with family members, and takes safety precautions such as using a side-by-side vehicle with a cage and seatbelts. She travels to various locations for riding, including [employer] and [place] in [place] .      She is followed by nephrology Review of Systems     Objective:   Physical Exam  General-in no acute distress Eyes-no discharge Lungs-respiratory rate normal, CTA CV-no murmurs,RRR Extremities skin warm dry no edema Neuro grossly normal Behavior normal, alert       Assessment & Plan:  1. Prediabetes (Primary) A1c is gone up we will recheck this again healthy diet stay physically active try to keep A1c below 6.5 and if possible lose weight bring A1c closer to 6 - Hemoglobin A1c - Hepatic Function Panel  2. Essential hypertension, benign Blood pressure decent control check lab work await results - Hepatic Function Panel  3. Stage 3a chronic kidney disease (HCC) Follows with nephrology await the findings of nephrology - Hepatic Function Panel  4. Hyperlipidemia, unspecified hyperlipidemia type Keep LDL below 70 check lipid profile - Lipid Panel

## 2023-08-17 NOTE — Addendum Note (Signed)
 Addended by: ALPHONSA GLENDIA LABOR on: 08/17/2023 09:38 PM   Modules accepted: Orders

## 2023-08-24 ENCOUNTER — Ambulatory Visit: Admitting: Urology

## 2023-08-24 DIAGNOSIS — F17200 Nicotine dependence, unspecified, uncomplicated: Secondary | ICD-10-CM | POA: Insufficient documentation

## 2023-08-24 NOTE — Progress Notes (Unsigned)
 Name: Sandra Trujillo DOB: 1960/05/07 MRN: [id number]  History of Present Illness: Sandra Trujillo is a 63 y.o. female who presents today as a new patient at [facility] [place].  Relevant History includes: 1. CKD stage 3. Followed by Nephrology (Dr. Rachele).  She reports chief complaint of microscopic hematuria. > 04/01/2023:  - Normal renal/bladder ultrasound (RUS).  > 05/15/2023: - Urine microscopy: 1 WBC/hpf, 10 RBC/hpf, no  nitrites  > 05/26/2023:  - Urine microscopy: 11-30 RBC/hpf, few bacteria, no WBC - GFR 37, creatinine 1.58  Today: She {Actions; denies-reports:120008} increased urinary urgency, frequency, nocturia, dysuria, gross hematuria, hesitancy, straining to void, or sensations of incomplete emptying. She {Actions; denies-reports:120008} flank pain or abdominal pain. She {Actions; denies-reports:120008} fevers, nausea, or vomiting.  She {Actions; denies-reports:120008} prior history of gross hematuria.  She {Actions; denies-reports:120008} history of kidney stones.  She {Actions; denies-reports:120008} history of pyelonephritis.  She {Actions; denies-reports:120008} history of recent or recurrent UTI. She {Actions; denies-reports:120008} history of GU malignancy or pelvic radiation.  She {Actions; denies-reports:120008} history of autoimmune disease. She {Actions; denies-reports:120008} history of sickle cell disease or sickle cell trait. She reports history of smoking (*** ppd x*** years). She {Actions; denies-reports:120008} taking anticoagulants (***Aspirin 81 mg ***Coumadin ***Xarelto ***Eliquis ***Plavix).  Medications: Current Outpatient Medications  Medication Sig Dispense Refill   acetaminophen  (TYLENOL ) 500 MG tablet Take 500 mg by mouth every 6 (six) hours as needed for mild pain.     chlorpheniramine (CHLOR-TRIMETON) 4 MG tablet Take 4 mg by mouth at bedtime.     Cholecalciferol (VITAMIN D ) 2000 UNITS CAPS Take 2,000 Units by mouth daily.      clobetasol ointment (TEMOVATE) 0.05 % Apply 1 application. topically 2 (two) times daily as needed (rash).     docusate sodium  (COLACE) 100 MG capsule Take 100 mg by mouth daily.     fluticasone  (FLONASE ) 50 MCG/ACT nasal spray SHAKE LIQUID AND USE 2 SPRAYS IN EACH NOSTRIL DAILY 16 g 1   indapamide  (LOZOL ) 2.5 MG tablet TAKE 1 TABLET(2.5 MG) BY MOUTH DAILY 90 tablet 1   losartan  (COZAAR ) 100 MG tablet TAKE 1 TABLET BY MOUTH EVERY DAY 90 tablet 3   mometasone (ELOCON) 0.1 % cream Apply 1 application. topically 2 (two) times daily as needed for rash.     pantoprazole  (PROTONIX ) 40 MG tablet Take 1 tablet (40 mg total) by mouth 2 (two) times daily before a meal. 180 tablet 3   RINVOQ 15 MG TB24 Take 1 tablet by mouth daily.     rosuvastatin  (CRESTOR ) 40 MG tablet Take 1 tablet (40 mg total) by mouth daily. 90 tablet 3   traZODone  (DESYREL ) 100 MG tablet TAKE 1 TABLET(100 MG) BY MOUTH AT BEDTIME AS NEEDED FOR SLEEP 90 tablet 1   No current facility-administered medications for this visit.    Allergies: Allergies  Allergen Reactions   Sulfa Antibiotics Other (See Comments)    Flu like symptoms 10 times worse.   Lisinopril  Cough    Past Medical History:  Diagnosis Date   Anemia    CKD (chronic kidney disease)    GERD (gastroesophageal reflux disease)    Hyperlipidemia    Hyperparathyroidism (HCC)    Hypertension    Past Surgical History:  Procedure Laterality Date   CERVICAL DISCECTOMY     COLONOSCOPY N/A 09/15/2014   SLF: 1. submucosal lesion in the hepatic flexure likely a lipoma 2 . mild diverticulosis in teh sigmoid colon and descending colon 3. the left colon is redundant  COLONOSCOPY WITH ESOPHAGOGASTRODUODENOSCOPY (EGD)  12/19/2002   WLM:dfjoo HH/mildchanges of reflux/otherwise normal. normal colonoscopy and terminal ileoscopy.   COLONOSCOPY WITH PROPOFOL  N/A 04/28/2023   Procedure: COLONOSCOPY WITH PROPOFOL ;  Surgeon: Cindie Carlin POUR, DO;  [facility];   Service: Endoscopy;  Laterality: N/A;  2:30 pm, asa 2   ESOPHAGEAL DILATION N/A 04/02/2015   Procedure: ESOPHAGEAL DILATION;  Surgeon: Margo LITTIE Haddock, MD;  [facility]: [facility];  Service: Endoscopy;  Laterality: N/A;   ESOPHAGOGASTRODUODENOSCOPY N/A 09/15/2014   SLF: 1. Schatzki ring at the gastroesophagel junction 2. moderate erosive gastritis/duoentitit and three small gastric ulcers due to ASA/ibuprofen   ESOPHAGOGASTRODUODENOSCOPY N/A 04/02/2015   Procedure: ESOPHAGOGASTRODUODENOSCOPY (EGD);  Surgeon: Margo LITTIE Haddock, MD;  [facility];  Service: Endoscopy;  Laterality: N/A;  1315 - moved to 2/13 @ 2:00   PARATHYROIDECTOMY N/A 06/28/2021   Procedure: NECK EXPLORATION WITH  LEFT SUPERIOR AND RIGHT INFERIOR PARATHYROIDECTOMY BIOPSY OF  LEFT INFERIOR PARATHYROID , AUTOTRANSPLANTATION OF LEFT INFERIOR PARATHYROID  TO SCM;  Surgeon: Eletha Boas, MD;  Location: [facility];  Service: General;  Laterality: N/A;   SAVORY DILATION N/A 09/15/2014   Procedure: SAVORY DILATION;  Surgeon: Margo LITTIE Haddock, MD;  [facility];  Service: Endoscopy;  Laterality: N/A;   WISDOM TOOTH EXTRACTION     Family History  Problem Relation Age of Onset   Hypertension Father    Heart failure Mother    Stroke Mother    Hypertension Mother    Hyperlipidemia Mother    Colon cancer Neg Hx    Inflammatory bowel disease Neg Hx    Liver disease Neg Hx    Social History   Socioeconomic History   Marital status: Widowed    Spouse name: Not on file   Number of children: 1   Years of education: Not on file   Highest education level: Bachelor's degree (e.g., BA, AB, BS)  Occupational History   Occupation: [employer]  Tobacco Use   Smoking status: Every Day    Current packs/day: 0.50    Average packs/day: 0.5 packs/day for 25.0 years (12.5 ttl pk-yrs)    Types: Cigarettes   Smokeless tobacco: Never  Vaping Use   Vaping status: Never Used  Substance and Sexual Activity   Alcohol use: No     Alcohol/week: 0.0 standard drinks of alcohol   Drug use: No   Sexual activity: Yes  Other Topics Concern   Not on file  Social History Narrative   Not on file   Social Drivers of Health   Financial Resource Strain: Low Risk  (08/17/2023)   Overall Financial Resource Strain (CARDIA)    Difficulty of Paying Living Expenses: Not hard at all  Food Insecurity: No Food Insecurity (08/17/2023)   Hunger Vital Sign    Worried About Running Out of Food in the Last Year: Never true    Ran Out of Food in the Last Year: Never true  Transportation Needs: No Transportation Needs (08/17/2023)   PRAPARE - Administrator, Civil Service (Medical): No    Lack of Transportation (Non-Medical): No  Physical Activity: Inactive (08/17/2023)   Exercise Vital Sign    Days of Exercise per Week: 0 days    Minutes of Exercise per Session: Not on file  Stress: No Stress Concern Present (08/17/2023)   [employer] of Occupational Health - Occupational Stress Questionnaire    Feeling of Stress: Not at all  Social Connections: Moderately Isolated (08/17/2023)   Social  Connection and Isolation Panel    Frequency of Communication with Friends and Family: More than three times a week    Frequency of Social Gatherings with Friends and Family: Once a week    Attends Religious Services: More than 4 times per year    Active Member of [employer] or Organizations: No    Attends Banker Meetings: Not on file    Marital Status: Widowed  Catering manager Violence: Not on file    SUBJECTIVE  Review of Systems Constitutional: Patient denies any unintentional weight loss or change in strength lntegumentary: Patient denies any rashes or pruritus Cardiovascular: Patient denies chest pain or syncope Respiratory: Patient denies shortness of breath Gastrointestinal: ***Patient denies nausea, vomiting, constipation, or diarrhea ***As per HPI Musculoskeletal: Patient denies muscle cramps or  weakness Neurologic: Patient denies convulsions or seizures Allergic/Immunologic: Patient denies recent allergic reaction(s) Hematologic/Lymphatic: Patient denies bleeding tendencies Endocrine: Patient denies heat/cold intolerance  GU: As per HPI.  OBJECTIVE There were no vitals filed for this visit. There is no height or weight on file to calculate BMI.  Physical Examination Constitutional: No obvious distress; patient is non-toxic appearing  Cardiovascular: No visible lower extremity edema.  Respiratory: The patient does not have audible wheezing/stridor; respirations do not appear labored  Gastrointestinal: Abdomen non-distended Musculoskeletal: Normal ROM of UEs  Skin: No obvious rashes/open sores  Neurologic: CN 2-12 grossly intact Psychiatric: Answered questions appropriately with normal affect  Hematologic/Lymphatic/Immunologic: No obvious bruises or sites of spontaneous bleeding  UA: ***negative ***positive for *** leukocytes, *** blood, ***nitrites Urine microscopy: *** WBC/hpf, *** RBC/hpf, *** bacteria ***glucosuria (secondary to ***Jardiance ***Farxiga use) ***otherwise unremarkable  PVR: *** ml  ASSESSMENT No diagnosis found.  For microscopic hematuria we discussed possible etiologies including but not limited to: vigorous exercise, sexual activity, stone, trauma, blood thinner use, urinary tract infection, urethral irritation secondary to ***vaginal atrophy, chronic kidney disease, glomerulonephropathy, ***BPH, ***radiation cystitis, malignancy. ***We discussed pt's nicotine use as a risk factor for GU cancer and encouraged ***continued cessation.***  Based on history and individual risk factors, patient was advised that the recommended workup includes ***repeat UA in 6 months ***RUS ***CT urogram ***cystoscopy per the AUA/SUFU 2025 Microhematuria guideline. Patient decided to pursue this work-up and follow-up afterward to discuss the results and formulate a treatment  plan based on the findings. All questions were answered.  *** AUA/SUFU 2025 Microhematuria guideline    PLAN Advised the following: ***RUS ***CT ordered. 2. ***No follow-ups on file.  No orders of the defined types were placed in this encounter.   It has been explained that the patient is to follow regularly with their PCP in addition to all other providers involved in their care and to follow instructions provided by these respective offices. Patient advised to contact urology clinic if any urologic-pertaining questions, concerns, new symptoms or problems arise in the interim period.  There are no Patient Instructions on file for this visit.  Electronically signed by:  Lauraine KYM Oz, MSN, FNP-C, CUNP 08/24/2023 1:30 PM

## 2023-08-25 ENCOUNTER — Ambulatory Visit: Admitting: Urology

## 2023-08-25 ENCOUNTER — Encounter: Payer: Self-pay | Admitting: Urology

## 2023-08-25 VITALS — BP 131/83 | HR 96 | Temp 98.4°F

## 2023-08-25 DIAGNOSIS — F17200 Nicotine dependence, unspecified, uncomplicated: Secondary | ICD-10-CM

## 2023-08-25 DIAGNOSIS — R3129 Other microscopic hematuria: Secondary | ICD-10-CM

## 2023-08-25 LAB — URINALYSIS, ROUTINE W REFLEX MICROSCOPIC
Bilirubin, UA: NEGATIVE
Glucose, UA: NEGATIVE
Ketones, UA: NEGATIVE
Leukocytes,UA: NEGATIVE
Nitrite, UA: NEGATIVE
Protein,UA: NEGATIVE
Specific Gravity, UA: 1.025 (ref 1.005–1.030)
Urobilinogen, Ur: 1 mg/dL (ref 0.2–1.0)
pH, UA: 6 (ref 5.0–7.5)

## 2023-08-25 LAB — MICROSCOPIC EXAMINATION: Bacteria, UA: NONE SEEN

## 2023-08-25 LAB — BLADDER SCAN AMB NON-IMAGING: Scan Result: 2

## 2023-08-25 NOTE — Progress Notes (Signed)
 Bladder Scan completed today.  Patient can void prior to the bladder scan. Bladder scan result: 2 ml  Performed By: [facility] LPN

## 2023-08-28 DIAGNOSIS — E785 Hyperlipidemia, unspecified: Secondary | ICD-10-CM | POA: Diagnosis not present

## 2023-08-28 DIAGNOSIS — R809 Proteinuria, unspecified: Secondary | ICD-10-CM | POA: Diagnosis not present

## 2023-08-28 DIAGNOSIS — N189 Chronic kidney disease, unspecified: Secondary | ICD-10-CM | POA: Diagnosis not present

## 2023-08-28 DIAGNOSIS — D631 Anemia in chronic kidney disease: Secondary | ICD-10-CM | POA: Diagnosis not present

## 2023-08-28 DIAGNOSIS — N1831 Chronic kidney disease, stage 3a: Secondary | ICD-10-CM | POA: Diagnosis not present

## 2023-08-28 DIAGNOSIS — I10 Essential (primary) hypertension: Secondary | ICD-10-CM | POA: Diagnosis not present

## 2023-08-28 DIAGNOSIS — R319 Hematuria, unspecified: Secondary | ICD-10-CM | POA: Diagnosis not present

## 2023-08-28 DIAGNOSIS — R7303 Prediabetes: Secondary | ICD-10-CM | POA: Diagnosis not present

## 2023-08-29 ENCOUNTER — Ambulatory Visit: Payer: Self-pay | Admitting: Family Medicine

## 2023-08-29 LAB — HEPATIC FUNCTION PANEL
ALT: 25 IU/L (ref 0–32)
AST: 23 IU/L (ref 0–40)
Albumin: 4.5 g/dL (ref 3.8–4.9)
Alkaline Phosphatase: 66 IU/L (ref 44–121)
Bilirubin Total: 0.5 mg/dL (ref 0.0–1.2)
Bilirubin, Direct: 0.19 mg/dL (ref 0.00–0.40)
Total Protein: 6.9 g/dL (ref 6.0–8.5)

## 2023-08-29 LAB — HEMOGLOBIN A1C
Est. average glucose Bld gHb Est-mCnc: 123 mg/dL
Hgb A1c MFr Bld: 5.9 % — ABNORMAL HIGH (ref 4.8–5.6)

## 2023-08-29 LAB — LIPID PANEL
Chol/HDL Ratio: 2.5 ratio (ref 0.0–4.4)
Cholesterol, Total: 153 mg/dL (ref 100–199)
HDL: 62 mg/dL (ref >39)
LDL Chol Calc (NIH): 74 mg/dL (ref 0–99)
Triglycerides: 93 mg/dL (ref 0–149)
VLDL Cholesterol Cal: 17 mg/dL (ref 5–40)

## 2023-09-08 DIAGNOSIS — E210 Primary hyperparathyroidism: Secondary | ICD-10-CM | POA: Diagnosis not present

## 2023-09-08 DIAGNOSIS — N1831 Chronic kidney disease, stage 3a: Secondary | ICD-10-CM | POA: Diagnosis not present

## 2023-09-08 DIAGNOSIS — R319 Hematuria, unspecified: Secondary | ICD-10-CM | POA: Diagnosis not present

## 2023-10-02 DIAGNOSIS — S46912A Strain of unspecified muscle, fascia and tendon at shoulder and upper arm level, left arm, initial encounter: Secondary | ICD-10-CM | POA: Diagnosis not present

## 2023-10-05 ENCOUNTER — Ambulatory Visit (HOSPITAL_COMMUNITY)

## 2023-10-07 IMAGING — NM NM PARATHYROID W/ SPECT
3 series · 8 of 8 positions shown · non-contrast
Comparison: Thyroid ultrasound 03/13/2021

CLINICAL DATA: Hypercalcemia, hyperparathyroidism, E83.52,

EXAM:
NM PARATHYROID SCINTIGRAPHY AND SPECT IMAGING
TECHNIQUE: Following intravenous administration of radiopharmaceutical, early
and 2-hour delayed planar images were obtained in the anterior
projection. Delayed triplanar SPECT images were also obtained at 2
hours.
RADIOPHARMACEUTICALS:  25.2 mCi Oc-OOm Sestamibi IV

[Series 1: 2 hr ant · 4.14mm/px · 1 of 1 slices shown]
[im 1/1]
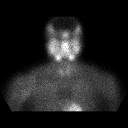

[Series 1: 15 min ant · 2.07mm/px · 1 of 1 slices shown]
[im 1/1]
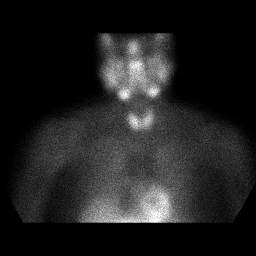

[Series 2: spect parathyroid · 4.14mm/px · 6 of 64 frames shown]
[frame 6/64  full-range]
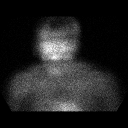
[frame 16/64  full-range]
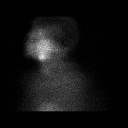
[frame 27/64  full-range]
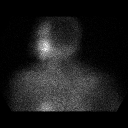
[frame 38/64  full-range]
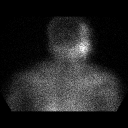
[frame 48/64  full-range]
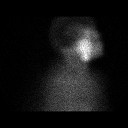
[frame 59/64  full-range]
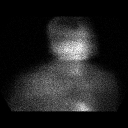

[8 of 8 positions shown; findings below may reference images not displayed]

FINDINGS: Planar imaging: Initial image demonstrates slightly asymmetric
increased tracer uptake in the RIGHT thyroid lobe versus LEFT.
Delayed image shows normal washout of tracer from thyroid tissue
without focal abnormal sestamibi retention.

SPECT imaging: No definite abnormal tracer accumulation is seen at
the expected positions of the parathyroid glands. A small focus of
sestamibi localization is seen anteriorly inferior to the LEFT
thyroid lobe, an atypical position for a parathyroid adenoma, of
uncertain etiology. No definite soft tissue nodule in is noted. A
few normal appearing cervical lymph nodes are identified on
ultrasound. No ectopic localization of tracer in the mediastinum.
IMPRESSION: No definite scintigraphic evidence of parathyroid adenoma.

A small focus of abnormal sestamibi retention is seen in the
anterior LEFT neck inferior to the LEFT thyroid lobe, superficial to
the thyroid gland, of uncertain etiology and significance.

This could reflect an artifact, ectopic thyroid tissue, nonspecific
soft tissue uptake, nodal uptake, unlikely an ectopic parathyroid
gland as these are not typically located anteriorly.

Recommend CT soft tissue neck with contrast for further evaluation.

## 2023-10-13 ENCOUNTER — Other Ambulatory Visit: Admitting: Urology

## 2023-11-09 ENCOUNTER — Ambulatory Visit (HOSPITAL_COMMUNITY)

## 2023-11-17 ENCOUNTER — Telehealth: Payer: Self-pay

## 2023-11-17 NOTE — Telephone Encounter (Signed)
 Tried calling pt with no answer. Left vm for return call to office

## 2023-11-17 NOTE — Telephone Encounter (Signed)
 Patient has letter of approval for her CT scan. Is being told she doesn't have approval by [facility].  Rescheduled her cysto until CT scan can be done.  Please advise.

## 2023-11-18 NOTE — Telephone Encounter (Signed)
 Called and lvm for pt that CT is only valid until 12/17/2023. Please r call [phone] to schedule her CT before the expiration date. Advised to c/b if she has any questions or concerns

## 2023-11-18 NOTE — Telephone Encounter (Signed)
 Pt called back inquiring about her CT scan. Pt is made aware her insurance only approve CT up to 30 days. Pt is made aware a new authorization is needed for her CT prior cysto and will be sent to referral coordination for authorization. Verbalized understanding

## 2023-11-24 ENCOUNTER — Other Ambulatory Visit: Admitting: Urology

## 2023-12-04 ENCOUNTER — Ambulatory Visit (HOSPITAL_COMMUNITY)
Admission: RE | Admit: 2023-12-04 | Discharge: 2023-12-04 | Disposition: A | Source: Ambulatory Visit | Attending: Urology | Admitting: Urology

## 2023-12-04 DIAGNOSIS — R3129 Other microscopic hematuria: Secondary | ICD-10-CM | POA: Diagnosis not present

## 2023-12-04 DIAGNOSIS — E042 Nontoxic multinodular goiter: Secondary | ICD-10-CM | POA: Insufficient documentation

## 2023-12-04 DIAGNOSIS — F17200 Nicotine dependence, unspecified, uncomplicated: Secondary | ICD-10-CM | POA: Diagnosis not present

## 2023-12-04 DIAGNOSIS — K7689 Other specified diseases of liver: Secondary | ICD-10-CM | POA: Diagnosis not present

## 2023-12-04 DIAGNOSIS — D259 Leiomyoma of uterus, unspecified: Secondary | ICD-10-CM | POA: Diagnosis not present

## 2023-12-04 LAB — POCT I-STAT CREATININE: Creatinine, Ser: 1.4 mg/dL — ABNORMAL HIGH (ref 0.44–1.00)

## 2023-12-04 MED ORDER — IOHEXOL 300 MG/ML  SOLN
80.0000 mL | Freq: Once | INTRAMUSCULAR | Status: AC | PRN
  Administered 2023-12-04: 80 mL via INTRAVENOUS

## 2024-01-01 DIAGNOSIS — R1024 Suprapubic pain: Secondary | ICD-10-CM | POA: Diagnosis not present

## 2024-01-18 DIAGNOSIS — I10 Essential (primary) hypertension: Secondary | ICD-10-CM | POA: Diagnosis not present

## 2024-01-18 DIAGNOSIS — R809 Proteinuria, unspecified: Secondary | ICD-10-CM | POA: Diagnosis not present

## 2024-01-18 DIAGNOSIS — E119 Type 2 diabetes mellitus without complications: Secondary | ICD-10-CM | POA: Diagnosis not present

## 2024-01-18 DIAGNOSIS — E559 Vitamin D deficiency, unspecified: Secondary | ICD-10-CM | POA: Diagnosis not present

## 2024-01-18 DIAGNOSIS — E211 Secondary hyperparathyroidism, not elsewhere classified: Secondary | ICD-10-CM | POA: Diagnosis not present

## 2024-01-18 DIAGNOSIS — N189 Chronic kidney disease, unspecified: Secondary | ICD-10-CM | POA: Diagnosis not present

## 2024-01-18 DIAGNOSIS — D631 Anemia in chronic kidney disease: Secondary | ICD-10-CM | POA: Diagnosis not present

## 2024-01-19 DIAGNOSIS — Z1231 Encounter for screening mammogram for malignant neoplasm of breast: Secondary | ICD-10-CM | POA: Diagnosis not present

## 2024-01-19 DIAGNOSIS — Z1331 Encounter for screening for depression: Secondary | ICD-10-CM | POA: Diagnosis not present

## 2024-01-19 DIAGNOSIS — Z01419 Encounter for gynecological examination (general) (routine) without abnormal findings: Secondary | ICD-10-CM | POA: Diagnosis not present

## 2024-01-19 LAB — HM MAMMOGRAPHY

## 2024-01-20 DIAGNOSIS — Z79899 Other long term (current) drug therapy: Secondary | ICD-10-CM | POA: Diagnosis not present

## 2024-01-20 DIAGNOSIS — L2089 Other atopic dermatitis: Secondary | ICD-10-CM | POA: Diagnosis not present

## 2024-01-25 NOTE — Progress Notes (Deleted)
 H&P  Chief Complaint: ***  History of Present Illness: BRIGITT MCCLISH is a 63 y.o. year old female ***  Past Medical History:  Diagnosis Date   Anemia    CKD (chronic kidney disease)    GERD (gastroesophageal reflux disease)    Hyperlipidemia    Hyperparathyroidism    Hypertension     Past Surgical History:  Procedure Laterality Date   CERVICAL DISCECTOMY     COLONOSCOPY N/A 09/15/2014   SLF: 1. submucosal lesion in the hepatic flexure likely a lipoma 2 . mild diverticulosis in teh sigmoid colon and descending colon 3. the left colon is redundant   COLONOSCOPY WITH ESOPHAGOGASTRODUODENOSCOPY (EGD)  12/19/2002   WLM:dfjoo HH/mildchanges of reflux/otherwise normal. normal colonoscopy and terminal ileoscopy.   COLONOSCOPY WITH PROPOFOL  N/A 04/28/2023   Procedure: COLONOSCOPY WITH PROPOFOL ;  Surgeon: Cindie Carlin POUR, DO;  [facility];  Service: Endoscopy;  Laterality: N/A;  2:30 pm, asa 2   ESOPHAGEAL DILATION N/A 04/02/2015   Procedure: ESOPHAGEAL DILATION;  Surgeon: Margo LITTIE Haddock, MD;  [facility];  Service: Endoscopy;  Laterality: N/A;   ESOPHAGOGASTRODUODENOSCOPY N/A 09/15/2014   SLF: 1. Schatzki ring at the gastroesophagel junction 2. moderate erosive gastritis/duoentitit and three small gastric ulcers due to ASA/ibuprofen   ESOPHAGOGASTRODUODENOSCOPY N/A 04/02/2015   Procedure: ESOPHAGOGASTRODUODENOSCOPY (EGD);  Surgeon: Margo LITTIE Haddock, MD;  [facility];  Service: Endoscopy;  Laterality: N/A;  1315 - moved to 2/13 @ 2:00   PARATHYROIDECTOMY N/A 06/28/2021   Procedure: NECK EXPLORATION WITH  LEFT SUPERIOR AND RIGHT INFERIOR PARATHYROIDECTOMY BIOPSY OF  LEFT INFERIOR PARATHYROID , AUTOTRANSPLANTATION OF LEFT INFERIOR PARATHYROID  TO SCM;  Surgeon: Eletha Boas, MD;  Location: [facility];  Service: General;  Laterality: N/A;   SAVORY DILATION N/A 09/15/2014   Procedure: SAVORY DILATION;  Surgeon: Margo LITTIE Haddock, MD;  [facility];  Service:  Endoscopy;  Laterality: N/A;   WISDOM TOOTH EXTRACTION      Home Medications:  (Not in a hospital admission)   Allergies:  Allergies  Allergen Reactions   Sulfa Antibiotics Other (See Comments)    Flu like symptoms 10 times worse.   Lisinopril  Cough    Family History  Problem Relation Age of Onset   Hypertension Father    Heart failure Mother    Stroke Mother    Hypertension Mother    Hyperlipidemia Mother    Colon cancer Neg Hx    Inflammatory bowel disease Neg Hx    Liver disease Neg Hx     Social History:  reports that she has been smoking cigarettes. She has a 12.5 pack-year smoking history. She has never used smokeless tobacco. She reports that she does not drink alcohol and does not use drugs.  ROS: A complete review of systems was performed.  All systems are negative except for pertinent findings as noted.  Physical Exam:  Vital signs in last 24 hours: @VSRANGES @ General:  Alert and oriented, No acute distress HEENT: Normocephalic, atraumatic Neck: No JVD or lymphadenopathy Cardiovascular: Regular rate  Lungs: Normal inspiratory/expiratory excursion Abdomen: Soft, nontender, nondistended, no abdominal masses Back: No CVA tenderness Extremities: No edema Neurologic: Grossly intact  I have reviewed prior pt notes  I have reviewed notes from referring/previous physicians  I have reviewed urinalysis results  I have independently reviewed prior imaging  I have reviewed prior urine culture    Indication: microhematuria  After informed consent and discussion of the procedure and its risks, pt was positioned  and prepped in the standard fashion.  Cystoscopy was performed with a flexible cystoscope.   Findings:  Urethra:*** Ureteral orifices:*** Bladder:***  Impression/Assessment:  ***  Plan:  ***  Garnette HERO Tanicka Bisaillon 01/25/2024, 7:22 AM  Garnette HERO. Xavyer Steenson MD

## 2024-01-26 ENCOUNTER — Other Ambulatory Visit: Admitting: Urology

## 2024-02-16 DIAGNOSIS — J45998 Other asthma: Secondary | ICD-10-CM | POA: Diagnosis not present

## 2024-02-16 DIAGNOSIS — Z1331 Encounter for screening for depression: Secondary | ICD-10-CM | POA: Diagnosis not present

## 2024-02-17 DIAGNOSIS — J929 Pleural plaque without asbestos: Secondary | ICD-10-CM | POA: Diagnosis not present

## 2024-02-17 DIAGNOSIS — J45998 Other asthma: Secondary | ICD-10-CM | POA: Diagnosis not present

## 2024-02-17 DIAGNOSIS — R918 Other nonspecific abnormal finding of lung field: Secondary | ICD-10-CM | POA: Diagnosis not present

## 2024-02-19 ENCOUNTER — Ambulatory Visit: Payer: Self-pay

## 2024-02-19 NOTE — Telephone Encounter (Signed)
 FYI Only or Action Required?: Action required by provider: request for appointment and update on patient condition.  Patient was last seen in primary care on 08/17/2023 by Alphonsa Glendia LABOR, MD.  Called Nurse Triage reporting Cough.  Symptoms began several months ago.  Interventions attempted: Prescription medications: tessalon pearls and albuterol .  Symptoms are: gradually worsening.  Triage Disposition: See HCP Within 4 Hours (Or PCP Triage)  Patient/caregiver understands and will follow disposition?: No, wishes to speak with PCP    Copied from CRM #[id number]. Topic: Clinical - Red Word Triage >> Feb 19, 2024  2:45 PM Delon HERO wrote: Red Word that prompted transfer to Nurse Triage: Patient is calling to report cough & congestion since Thanksgiving. With SOB & wheezing since Christmas. Reason for Disposition  [1] MILD difficulty breathing (e.g., minimal/no SOB at rest, SOB with walking, pulse < 100) AND [2] still present when not coughing  Answer Assessment - Initial Assessment Questions Scheduled to see Dr. Alphonsa on the 7th but she can't get off work that day. Has had a cough since thanksgiving. On christmas the cough got worse and she was some wheezing and shortness of breath. Cough is worse at night, when she is laying down and all the sinus drainage goes to her throat and then she is up all night coughing. Was seen telehealth through her employer [employer], and they gave her albuterol  inhaler and tessalon pearls. She states she doesn't sound as wheezy but is using the albuterol  inhaler every 4 hours. She states she does have some shortness of breath with sitting but not always. Denies any chest pain, shortness of breath or wheezing currently. Recs are to be seen today, due to schedule availability, forwarding to office to assist.   Her only available days this month are 9th, 12th, 19th, or 26th.    1. ONSET: When did the cough begin?      Thanksgiving  2. SEVERITY: How bad is the  cough today?      Worse at night 3. SPUTUM: Describe the color of your sputum (e.g., none, dry cough; clear, white, yellow, green)     denies 4. HEMOPTYSIS: Are you coughing up any blood? If Yes, ask: How much? (e.g., flecks, streaks, tablespoons, etc.)     denies 5. DIFFICULTY BREATHING: Are you having difficulty breathing? If Yes, ask: How bad is it? (e.g., mild, moderate, severe)      Mild  6. FEVER: Do you have a fever? If Yes, ask: What is your temperature, how was it measured, and when did it start?     denies 7. CARDIAC HISTORY: Do you have any history of heart disease? (e.g., heart attack, congestive heart failure)      htn 8. LUNG HISTORY: Do you have any history of lung disease?  (e.g., pulmonary embolus, asthma, emphysema)  10. OTHER SYMPTOMS: Do you have any other symptoms? (e.g., runny nose, wheezing, chest pain)       Wheezing, sinus drainage.  Protocols used: Cough - Acute Non-Productive-A-AH

## 2024-02-22 NOTE — Telephone Encounter (Signed)
 Pt scheduled an appt for 02/26/23 for further evaluation

## 2024-02-24 ENCOUNTER — Ambulatory Visit: Admitting: Family Medicine

## 2024-02-26 ENCOUNTER — Ambulatory Visit

## 2024-02-26 VITALS — BP 133/84 | Temp 97.3°F | Wt 191.8 lb

## 2024-02-26 DIAGNOSIS — J069 Acute upper respiratory infection, unspecified: Secondary | ICD-10-CM

## 2024-02-26 DIAGNOSIS — R7303 Prediabetes: Secondary | ICD-10-CM

## 2024-02-26 DIAGNOSIS — I10 Essential (primary) hypertension: Secondary | ICD-10-CM

## 2024-02-26 DIAGNOSIS — R0602 Shortness of breath: Secondary | ICD-10-CM

## 2024-02-26 DIAGNOSIS — B9689 Other specified bacterial agents as the cause of diseases classified elsewhere: Secondary | ICD-10-CM

## 2024-02-26 DIAGNOSIS — N1831 Chronic kidney disease, stage 3a: Secondary | ICD-10-CM

## 2024-02-26 MED ORDER — AMOXICILLIN-POT CLAVULANATE 875-125 MG PO TABS: 1.0000 | ORAL_TABLET | Freq: Two times a day (BID) | ORAL | 0 refills | Status: DC

## 2024-02-26 MED ORDER — BUDESONIDE-FORMOTEROL FUMARATE 80-4.5 MCG/ACT IN AERO: 2.0000 | INHALATION_SPRAY | Freq: Two times a day (BID) | RESPIRATORY_TRACT | 2 refills | Status: AC

## 2024-02-26 MED ORDER — AMLODIPINE BESYLATE 5 MG PO TABS: 5.0000 mg | ORAL_TABLET | Freq: Every day | ORAL | 1 refills | Status: AC

## 2024-02-26 NOTE — Progress Notes (Signed)
 "  Acute Office Visit  Subjective:     Patient ID: Sandra Trujillo, female    DOB: [birth year], 62 y.o.   MRN: [id number]  Chief Complaint  Patient presents with   Cough    Since thanksgiving, pt states she felt better for one week but It came back christmas day    Chills    Christmas day and few days after Pt states she thinks she may have had a fever   Hoarse    Pt states she has ben coughing so much she has become hoars   Generalized Body Aches    Pt states christmas day and a few days after    Cough Associated symptoms include chills, rhinorrhea and shortness of breath. Pertinent negatives include no chest pain, fever, headaches, sore throat or wheezing.   Patient is in today for follow-up regarding blood pressure.  Has a history of high blood pressure and is currently taking losartan  and and indapamide  as prescribed.  Takes blood pressure at home and says that it ranges 140/90.  Has a history of prediabetes.  Diet is good, she tries to avoids foods and beverages high in carbohydrates and sugars.  Does not currently exercise daily as she has been sick the past month.   Has been sick since Thanksgiving.  Reports cough, hoarseness, chills, body aches, and headache.  Symptoms started upper respiratory and moved down to her chest.  Reports shortness of breath with activity.  Denies wheezing or fever. Cough is nonproductive.  Had a virtual visit at [facility] and was given Tessalon Perles and albuterol .  Has been using albuterol  1-3 times per day.  Has also taken DayQuil and NyQuil for symptom management.  Review of Systems  Constitutional:  Positive for activity change, chills and fatigue. Negative for fever.  HENT:  Positive for congestion, rhinorrhea and sinus pressure. Negative for sinus pain and sore throat.   Eyes:  Negative for visual disturbance.  Respiratory:  Positive for cough and shortness of breath. Negative for chest tightness and wheezing.   Cardiovascular:  Negative for chest  pain, palpitations and leg swelling.  Genitourinary:  Negative for difficulty urinating.  Neurological:  Negative for dizziness and headaches.       Objective:    Today's Vitals   02/26/24 1120  BP: 133/84  Temp: (!) 97.3 F (36.3 C)  SpO2: 100%  Weight: 191 lb 12.8 oz (87 kg)   Body mass index is 32.92 kg/m.   Physical Exam Vitals and nursing note reviewed.  Constitutional:      General: She is not in acute distress.    Appearance: Normal appearance. She is not ill-appearing.  HENT:     Nose: Congestion and rhinorrhea present.     Right Sinus: Maxillary sinus tenderness and frontal sinus tenderness present.     Left Sinus: Maxillary sinus tenderness and frontal sinus tenderness present.     Mouth/Throat:     Pharynx: No posterior oropharyngeal erythema.  Cardiovascular:     Rate and Rhythm: Normal rate and regular rhythm.     Heart sounds: Normal heart sounds, S1 normal and S2 normal. No murmur heard. Pulmonary:     Effort: Pulmonary effort is normal. No respiratory distress.     Breath sounds: Normal breath sounds. No wheezing.  Neurological:     Mental Status: She is alert.  Psychiatric:        Mood and Affect: Mood normal.        Behavior: Behavior normal.  Thought Content: Thought content normal.        Judgment: Judgment normal.    No results found for any visits on 02/26/24.    Assessment & Plan:  1. Essential hypertension, benign (Primary) -Patient's blood pressure normal today. Discussed with patient that due to her blood pressure being over 140/90 at home, that I would like to add another blood pressure medication at this time to avoid any complications. Patient agrees.  -Patient educated on DASH-style diet, sodium restriction, and lifestyle modifications to support blood pressure control. Patient verbalized understanding, and blood pressure will continue to be monitored at home.  Gave target blood pressure goal of less than 140/90. Advised patient to  let us  know if systolic drops below 100 or she experiences dizziness. Verbalizes understanding.  -Would like to follow-up regarding her blood pressure in 1 month. - Comprehensive metabolic panel - amLODipine  (NORVASC ) 5 MG tablet; Take 1 tablet (5 mg total) by mouth daily.  Dispense: 30 tablet; Refill: 1  2. Prediabetes -Patient agrees to continue to work on diet by decreasing intake of foods and beverages high in carbohydrates and sugar.  Recommended at least 150 minutes/week of exercise.  -Will recheck A1c today. - Hemoglobin A1c  3. Stage 3a chronic kidney disease (HCC) -Patient to continue following up with nephrology.  - Comprehensive metabolic panel  4. Bacterial URI -Due to duration of symptoms we will treat for bacterial URI at this time with antibiotics. -Supportive care discussed (hydration, nasal saline, analgesics as appropriate). -Patient advised to complete full antibiotic course and return if symptoms worsen or fail to improve. -Advised patient that if she experiences double worsening, fever, body aches or chills, to let us  know.   -ER precautions reviewed.   - amoxicillin -clavulanate (AUGMENTIN ) 875-125 MG tablet; Take 1 tablet by mouth 2 (two) times daily.  Dispense: 14 tablet; Refill: 0  5. Shortness of breath -Patient looks good on exam. Due to patient having increased albuterol  use and duration of symptoms, would like to start her on Symbicort  daily at this time until symptoms resolve.  Explained to her that Symbicort  has an inhaled steroid that will decrease inflammation in the lungs.  Prescribe this in attempts to decrease the need for albuterol . -Advised patient that albuterol  is an inhaler that is to be used on a as needed basis.  Can use this for breakthrough symptoms such as wheezing, cough, or shortness of breath. -ED precautions reviewed. - budesonide -formoterol  (SYMBICORT ) 80-4.5 MCG/ACT inhaler; Inhale 2 puffs into the lungs 2 (two) times daily.  Dispense: 1  each; Refill: 2   Return in about 4 weeks (around 03/25/2024).  Damien KATHEE Pringle, FNP  "

## 2024-02-28 ENCOUNTER — Other Ambulatory Visit: Payer: Self-pay | Admitting: Family Medicine

## 2024-03-08 ENCOUNTER — Telehealth: Payer: Self-pay | Admitting: Family Medicine

## 2024-03-08 ENCOUNTER — Other Ambulatory Visit: Payer: Self-pay | Admitting: Family Medicine

## 2024-03-08 ENCOUNTER — Ambulatory Visit: Payer: Self-pay

## 2024-03-08 LAB — COMPREHENSIVE METABOLIC PANEL WITH GFR
ALT: 27 IU/L (ref 0–32)
AST: 25 IU/L (ref 0–40)
Albumin: 4.5 g/dL (ref 3.9–4.9)
Alkaline Phosphatase: 72 IU/L (ref 49–135)
BUN/Creatinine Ratio: 18 (ref 12–28)
BUN: 21 mg/dL (ref 8–27)
Bilirubin Total: 0.6 mg/dL (ref 0.0–1.2)
CO2: 23 mmol/L (ref 20–29)
Calcium: 10.3 mg/dL (ref 8.7–10.3)
Chloride: 103 mmol/L (ref 96–106)
Creatinine, Ser: 1.17 mg/dL — ABNORMAL HIGH (ref 0.57–1.00)
Globulin, Total: 2.1 g/dL (ref 1.5–4.5)
Glucose: 86 mg/dL (ref 70–99)
Potassium: 4.1 mmol/L (ref 3.5–5.2)
Sodium: 141 mmol/L (ref 134–144)
Total Protein: 6.6 g/dL (ref 6.0–8.5)
eGFR: 53 mL/min/1.73 — ABNORMAL LOW

## 2024-03-08 LAB — HEMOGLOBIN A1C
Est. average glucose Bld gHb Est-mCnc: 128 mg/dL
Hgb A1c MFr Bld: 6.1 % — ABNORMAL HIGH (ref 4.8–5.6)

## 2024-03-08 NOTE — Telephone Encounter (Signed)
 Refill    pantoprazole  (PROTONIX ) 40 MG tablet   [facility]

## 2024-03-09 ENCOUNTER — Other Ambulatory Visit: Payer: Self-pay | Admitting: Nurse Practitioner

## 2024-03-09 MED ORDER — PANTOPRAZOLE SODIUM 40 MG PO TBEC: 40.0000 mg | DELAYED_RELEASE_TABLET | Freq: Two times a day (BID) | ORAL | 1 refills | Status: AC

## 2024-03-10 ENCOUNTER — Encounter: Payer: Self-pay | Admitting: Family Medicine

## 2024-03-13 ENCOUNTER — Telehealth: Admitting: Physician Assistant

## 2024-03-13 ENCOUNTER — Encounter

## 2024-03-13 DIAGNOSIS — B379 Candidiasis, unspecified: Secondary | ICD-10-CM

## 2024-03-13 DIAGNOSIS — T3695XA Adverse effect of unspecified systemic antibiotic, initial encounter: Secondary | ICD-10-CM

## 2024-03-14 ENCOUNTER — Other Ambulatory Visit: Admitting: Urology

## 2024-03-14 ENCOUNTER — Telehealth: Payer: Self-pay

## 2024-03-14 MED ORDER — FLUCONAZOLE 150 MG PO TABS: 150.0000 mg | ORAL_TABLET | ORAL | 0 refills | Status: AC | PRN

## 2024-03-14 NOTE — Progress Notes (Signed)

## 2024-03-14 NOTE — Telephone Encounter (Signed)
 Prescription Request  03/14/2024  LOV: Visit date not found  What is the name of the medication or equipment? rosuvastatin  (CRESTOR ) 40 MG tablet   Have you contacted your pharmacy to request a refill? Yes   Which pharmacy would you like this sent to?  [id number] - [place], [place] - [address] [place] [address] Phone: [phone] Fax: [phone]    Patient notified that their request is being sent to the clinical staff for review and that they should receive a response within 2 business days.   Please advise at Mobile [phone] (mobile)

## 2024-03-15 ENCOUNTER — Other Ambulatory Visit: Payer: Self-pay

## 2024-03-15 DIAGNOSIS — E785 Hyperlipidemia, unspecified: Secondary | ICD-10-CM

## 2024-03-15 DIAGNOSIS — I10 Essential (primary) hypertension: Secondary | ICD-10-CM

## 2024-03-15 MED ORDER — ROSUVASTATIN CALCIUM 40 MG PO TABS: 40.0000 mg | ORAL_TABLET | Freq: Every day | ORAL | 3 refills | Status: AC

## 2024-03-15 NOTE — Telephone Encounter (Signed)
 Refill sent in

## 2024-03-22 ENCOUNTER — Telehealth: Payer: Self-pay | Admitting: Family Medicine

## 2024-03-22 NOTE — Telephone Encounter (Signed)
 Patient had FMLA sent over on her father Sandra Trujillo to be completed in your basket up front.

## 2024-03-25 ENCOUNTER — Ambulatory Visit

## 2024-03-31 ENCOUNTER — Ambulatory Visit

## 2024-04-11 ENCOUNTER — Other Ambulatory Visit: Admitting: Urology
